# Patient Record
Sex: Male | Born: 1955 | Race: White | Hispanic: No | Smoking: Never smoker
Health system: Southern US, Community
[De-identification: ages and names within clinical notes are randomized; demographics above are authoritative.]

## PROBLEM LIST (undated history)

## (undated) DIAGNOSIS — N486 Induration penis plastica: Secondary | ICD-10-CM

## (undated) DIAGNOSIS — K649 Unspecified hemorrhoids: Secondary | ICD-10-CM

## (undated) DIAGNOSIS — K219 Gastro-esophageal reflux disease without esophagitis: Secondary | ICD-10-CM

## (undated) DIAGNOSIS — E039 Hypothyroidism, unspecified: Secondary | ICD-10-CM

## (undated) DIAGNOSIS — T7840XA Allergy, unspecified, initial encounter: Secondary | ICD-10-CM

## (undated) DIAGNOSIS — E785 Hyperlipidemia, unspecified: Secondary | ICD-10-CM

## (undated) HISTORY — PX: HERNIA REPAIR: SHX51

## (undated) HISTORY — PX: SPINE SURGERY: SHX786

## (undated) HISTORY — DX: Gastro-esophageal reflux disease without esophagitis: K21.9

## (undated) HISTORY — PX: OTHER SURGICAL HISTORY: SHX169

## (undated) HISTORY — PX: COLONOSCOPY: SHX174

## (undated) HISTORY — DX: Hyperlipidemia, unspecified: E78.5

## (undated) HISTORY — DX: Unspecified hemorrhoids: K64.9

## (undated) HISTORY — PX: TOENAIL EXCISION: SHX183

## (undated) HISTORY — DX: Induration penis plastica: N48.6

## (undated) HISTORY — DX: Allergy, unspecified, initial encounter: T78.40XA

## (undated) HISTORY — PX: BACK SURGERY: SHX140

## (undated) HISTORY — DX: Hypothyroidism, unspecified: E03.9

## (undated) HISTORY — PX: MOHS SURGERY: SUR867

---
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
## 2003-09-28 HISTORY — PX: INGUINAL HERNIA REPAIR: SHX194

## 2004-04-03 ENCOUNTER — Ambulatory Visit: Payer: Self-pay | Admitting: Internal Medicine

## 2004-08-26 ENCOUNTER — Ambulatory Visit: Payer: Self-pay | Admitting: Internal Medicine

## 2004-08-29 ENCOUNTER — Encounter: Payer: Self-pay | Admitting: Internal Medicine

## 2004-09-05 ENCOUNTER — Ambulatory Visit: Payer: Self-pay | Admitting: Orthopedic Surgery

## 2005-05-13 ENCOUNTER — Ambulatory Visit: Payer: Self-pay | Admitting: Internal Medicine

## 2005-08-18 ENCOUNTER — Ambulatory Visit: Payer: Self-pay | Admitting: Family Medicine

## 2005-09-18 ENCOUNTER — Ambulatory Visit: Payer: Self-pay | Admitting: Internal Medicine

## 2005-11-27 ENCOUNTER — Ambulatory Visit: Payer: Self-pay | Admitting: Internal Medicine

## 2007-02-19 DIAGNOSIS — N486 Induration penis plastica: Secondary | ICD-10-CM | POA: Insufficient documentation

## 2007-02-19 DIAGNOSIS — A6000 Herpesviral infection of urogenital system, unspecified: Secondary | ICD-10-CM | POA: Insufficient documentation

## 2007-03-17 ENCOUNTER — Ambulatory Visit: Payer: Self-pay | Admitting: Internal Medicine

## 2007-03-17 DIAGNOSIS — J309 Allergic rhinitis, unspecified: Secondary | ICD-10-CM | POA: Insufficient documentation

## 2007-03-17 DIAGNOSIS — R079 Chest pain, unspecified: Secondary | ICD-10-CM | POA: Insufficient documentation

## 2007-04-28 ENCOUNTER — Ambulatory Visit: Payer: Self-pay | Admitting: Gastroenterology

## 2007-05-12 ENCOUNTER — Encounter: Payer: Self-pay | Admitting: Internal Medicine

## 2007-05-12 ENCOUNTER — Ambulatory Visit: Payer: Self-pay | Admitting: Gastroenterology

## 2007-05-12 LAB — HM COLONOSCOPY

## 2007-11-07 ENCOUNTER — Emergency Department: Payer: Self-pay | Admitting: Emergency Medicine

## 2008-05-26 ENCOUNTER — Ambulatory Visit: Payer: Self-pay | Admitting: Internal Medicine

## 2008-05-26 LAB — CONVERTED CEMR LAB
Cholesterol: 159 mg/dL (ref 0–200)
Glucose, Bld: 95 mg/dL (ref 70–99)
HDL: 37.9 mg/dL — ABNORMAL LOW (ref >39.00)
LDL Cholesterol: 108 mg/dL — ABNORMAL HIGH (ref 0–99)
PSA: 1.9 ng/mL (ref 0.10–4.00)
Total CHOL/HDL Ratio: 4
Triglycerides: 68 mg/dL (ref 0.0–149.0)
VLDL: 13.6 mg/dL (ref 0.0–40.0)

## 2008-09-10 ENCOUNTER — Emergency Department: Payer: Self-pay | Admitting: Emergency Medicine

## 2009-07-11 ENCOUNTER — Ambulatory Visit: Payer: Self-pay | Admitting: Internal Medicine

## 2009-07-11 DIAGNOSIS — D239 Other benign neoplasm of skin, unspecified: Secondary | ICD-10-CM | POA: Insufficient documentation

## 2009-07-12 LAB — CONVERTED CEMR LAB
Glucose, Bld: 95 mg/dL (ref 70–99)
PSA: 1.65 ng/mL (ref 0.10–4.00)

## 2010-02-24 LAB — CONVERTED CEMR LAB
ALT: 30 units/L (ref 0–53)
AST: 23 units/L (ref 0–37)
Albumin: 4.2 g/dL (ref 3.5–5.2)
Alkaline Phosphatase: 62 units/L (ref 39–117)
BUN: 15 mg/dL (ref 6–23)
Basophils Absolute: 0 10*3/uL (ref 0.0–0.1)
Basophils Relative: 0.1 % (ref 0.0–1.0)
Bilirubin Urine: NEGATIVE
Bilirubin, Direct: 0.1 mg/dL (ref 0.0–0.3)
Blood in Urine, dipstick: NEGATIVE
CO2: 31 meq/L (ref 19–32)
Calcium: 9.3 mg/dL (ref 8.4–10.5)
Chloride: 103 meq/L (ref 96–112)
Creatinine, Ser: 1.4 mg/dL (ref 0.4–1.5)
Eosinophils Absolute: 0.1 10*3/uL (ref 0.0–0.6)
Eosinophils Relative: 2 % (ref 0.0–5.0)
GFR calc Af Amer: 69 mL/min
GFR calc non Af Amer: 57 mL/min
Glucose, Bld: 90 mg/dL (ref 70–99)
Glucose, Urine, Semiquant: NEGATIVE
HCT: 48.4 % (ref 39.0–52.0)
Hemoglobin: 16.2 g/dL (ref 13.0–17.0)
Ketones, urine, test strip: NEGATIVE
Lymphocytes Relative: 25.7 % (ref 12.0–46.0)
MCHC: 33.4 g/dL (ref 30.0–36.0)
MCV: 91.1 fL (ref 78.0–100.0)
Monocytes Absolute: 0.7 10*3/uL (ref 0.2–0.7)
Monocytes Relative: 9.2 % (ref 3.0–11.0)
Neutro Abs: 4.5 10*3/uL (ref 1.4–7.7)
Neutrophils Relative %: 63 % (ref 43.0–77.0)
Nitrite: NEGATIVE
PSA: 1.59 ng/mL (ref 0.10–4.00)
Phosphorus: 3.6 mg/dL (ref 2.3–4.6)
Platelets: 290 10*3/uL (ref 150–400)
Potassium: 4.3 meq/L (ref 3.5–5.1)
Protein, U semiquant: NEGATIVE
RBC: 5.32 M/uL (ref 4.22–5.81)
RDW: 11.4 % — ABNORMAL LOW (ref 11.5–14.6)
Sodium: 140 meq/L (ref 135–145)
Specific Gravity, Urine: <1.005
TSH: 1.29 microintl units/mL (ref 0.35–5.50)
Total Bilirubin: 1.1 mg/dL (ref 0.3–1.2)
Total Protein: 6.8 g/dL (ref 6.0–8.3)
Urobilinogen, UA: 0.2
WBC Urine, dipstick: NEGATIVE
WBC: 7.2 10*3/uL (ref 4.5–10.5)
pH: 7

## 2010-02-26 NOTE — Assessment & Plan Note (Signed)
Summary: CPX/RBH   Vital Signs:  Patient profile:   55 year old male Height:      67 inches Weight:      176 pounds BMI:     27.67 Temp:     98.4 degrees F oral Pulse rate:   60 / minute Pulse rhythm:   regular BP sitting:   120 / 66  (left arm) Cuff size:   regular  Vitals Entered By: Mervin Hack CMA (May 26, 2008 8:09 AM)  History of Present Illness: Chief Complaint: adult physical  Doing fairly well  Has noticed some pounding in left ear at times--faster than his heart No change in mild hearing loss No tinnitus or vertigo No headaches  No other concerns  Allergies: 1)  ! * Propulsid  Past History:  Past medical, surgical, family and social histories (including risk factors) reviewed for relevance to current acute and chronic problems.  Past Medical History:    Reviewed history from 03/17/2007 and no changes required:    Peyronnie's    Allergic rhinitis  Past Surgical History:    Reviewed history from 02/19/2007 and no changes required:    Inguinal herniorrhaphy (09/2003)    Tonsillectomy (1972)    Partial toenail removal (1995)  Family History:    Reviewed history from 03/17/2007 and no changes required:       Dad with MI       Mom with arthritis       Pat aunt--jaw  cancer       Both GF died of CAD       Pat GM died of suicide. Had breast cancer       Mat GM died of CVA       No DM, HTN       Sister has Hodgkin's lymphoma       Pat uncle with celiac disease       Other sister had precancerous polyps       Pat uncle--mesothelioma       Other breast cancer also  Social History:    Reviewed history from 03/17/2007 and no changes required:       Marital Status: Married       Children: 2       Occupation: Print production planner at [employer]       Never Smoked       Alcohol use-no  Review of Systems General:  generally doesn't sleep that well--wife snores bad and has sleep eval Exercises regularly over the past couple of months weight  down slightly in past year Wears seat belt. Eyes:  Denies double vision and vision loss-1 eye. ENT:  Denies decreased hearing, earache, and ringing in ears; teeth okay--sees dentist. CV:  Denies chest pain or discomfort, difficulty breathing at night, difficulty breathing while lying down, fainting, lightheadness, and shortness of breath with exertion. Resp:  Complains of cough; denies shortness of breath; has globus sensation--work up in past Rx with sinus stuff and prilosec seen at [facility]. GI:  Complains of indigestion; denies abdominal pain, bloody stools, change in bowel habits, dark tarry stools, nausea, and vomiting; only occ heartburn--no regular meds. GU:  Complains of decreased libido, nocturia, and urinary hesitancy; denies erectile dysfunction and urinary frequency; occ right inguinal pain--better since starting exercise program Only intermittent nocturia slight dribbling. MS:  Complains of joint pain; denies joint swelling; occ mild scattered joint pain--rare ibuprofen. Derm:  Complains of lesion(s); denies rash; still with red round lesion on upper left arm.  Neuro:  Denies headaches, numbness, tingling, and weakness. Psych:  Denies anxiety and depression. Endo:  Denies cold intolerance and heat intolerance. Heme:  Denies abnormal bruising and enlarge lymph nodes. Allergy:  Denies seasonal allergies and sneezing; runny nose occ when mowing.  Physical Exam  General:  alert and normal appearance.   Eyes:  pupils equal, pupils round, pupils reactive to light, and no optic disk abnormalities.   Ears:  R ear normal and L ear normal.   Mouth:  no erythema and no lesions.   Neck:  supple, no masses, no thyromegaly, no carotid bruits, and no cervical lymphadenopathy.   Lungs:  normal respiratory effort and normal breath sounds.   Heart:  normal rate, regular rhythm, no murmur, and no gallop.   Abdomen:  soft, non-tender, and no masses.   Rectal:  no hemorrhoids and no masses.     Prostate:  no gland enlargement and no nodules.   Msk:  no joint tenderness and no joint swelling.   Pulses:  normal in feet Extremities:  no edema Neurologic:  alert & oriented X3 and strength normal in all extremities.   Skin:  8mm slightly raised irregular bordered lesion on upper left arm no other lesions Axillary Nodes:  No palpable lymphadenopathy Psych:  normally interactive, good eye contact, not anxious appearing, and not depressed appearing.     Impression & Recommendations:  Problem # 1:  PREVENTIVE HEALTH CARE (ICD-V70.0) Assessment Comment Only  healthy will check PSA, lipid, glucose will get tetanus at work  Orders: TLB-Lipid Panel (80061-LIPID) TLB-Glucose, QUANT (82947-GLU)  Problem # 2:  DERMATOFIBROMA (ICD-216.9) Assessment: New  liquid nitrogen 40 seconds x 2  Orders: Cryotherapy/Destruction benign or premalignant lesion (1st lesion)  (17000)  Complete Medication List: 1)  Triamcinolone Acetonide 0.1 % Crea (Triamcinolone acetonide) 2)  Proctosol Hc 2.5 % Crea (Hydrocortisone)  Other Orders: TLB-PSA (Prostate Specific Antigen) (84153-PSA)  Patient Instructions: 1)  Please schedule a follow-up appointment in 1 year.     Current Allergies (reviewed today): ! * PROPULSID

## 2010-02-26 NOTE — Procedures (Signed)
Summary: Colonoscopy by Dr.Jacobs  Colonoscopy by Dr.Jacobs   Imported By: Beau Fanny 05/20/2007 14:14:23  _____________________________________________________________________  External Attachment:    Type:   Image     Comment:   External Document  Appended Document: Colonoscopy by Dr.Jacobs    Clinical Lists Changes  Observations: Added new observation of COLONNXTDUE: 04/2017 (05/21/2007 17:55) Added new observation of COLONOSCOPY: diverticulosis (05/12/2007 17:56)        Colonoscopy  Procedure date:  05/12/2007  Findings:      diverticulosis  Procedures Next Due Date:    Colonoscopy: 04/2017

## 2010-02-26 NOTE — Consult Note (Signed)
Summary: Willie Griffin   Imported By: Carin Primrose 03/18/2007 09:22:21  _____________________________________________________________________  External Attachment:    Type:   Image     Comment:   External Document

## 2010-02-26 NOTE — Assessment & Plan Note (Signed)
Summary: CPX/HEA   Vital Signs:  Patient Profile:   55 Years Old Male Weight:      179.38 pounds Temp:     97.9 degrees F oral Pulse rate:   68 / minute BP sitting:   122 / 76  (left arm)  Vitals Entered By: Wandra Mannan (March 17, 2007 9:23 AM)              Vision Screening: Left eye w/o correction: 20 / 20 Right Eye w/o correction: 20 / 20 Both eyes w/o correction:  20/ 20  Color vision testing: normal      Vision Entered By: Wandra Mannan (March 17, 2007 9:24 AM) Audiometry Screening        25db HL: Yes 40db HL: Yes Left  500 hz: 25db 1000 hz: 25db 2000 hz: 25db 4000 hz: 25db Right  500 hz: 25db 1000 hz: 25db 2000 hz: 25db 4000 hz: 25db    Chief Complaint:  CPx fill out DOT form.  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 * PROPULSID  Past Medical History:    Peyronnie's    Allergic rhinitis  Past Surgical History:    Reviewed history from 02/19/2007 and no changes required:       Inguinal herniorrhaphy (09/2003)       Tonsillectomy (1972)       Partial toenail removal (1995)   Family History:    Dad with MI    Mom with arthritis    Pat aunt--jaw  cancer    Both GF died of CAD    Pat GM died of suicide. Had breast cancer    Mat GM died of CVA    No DM, HTN    Sister has Hodgkin's lymphoma    Pat uncle with celiac disease    Other sister had precancerous polyps    Pat uncle--mesothelioma  Other breast cancer also  Social History:    Marital Status: Married    Children: 2    Occupation: Print production planner at [employer]    Never Smoked    Alcohol use-no   Risk Factors:  Tobacco use:  never Alcohol use:  no   Review of Systems       The patient complains of chest pain.  The patient denies vision loss, decreased hearing, syncope, dyspnea on exhertion, peripheral edema, abdominal pain, melena, hematochezia, muscle weakness, suspicious skin lesions, depression, abnormal bleeding, and enlarged lymph nodes.         Sleeps fair--not as well as in past.  Only occ daytime somnolence wears seat belt teeth okay--regular with dentist Chest pain 2 weeks ago while digging a grave by himself---was up by left shoulder. Felt heart racing. No SOB Chronic cough--feeling in throat did have dry patch on left arm--better with cortisone cream Has foster kids--twins   Physical Exam  General:     alert and normal appearance.   Eyes:     pupils equal,  pupils round, pupils reactive to light, and no optic disk abnormalities.   Ears:     R ear normal and L ear normal.   Mouth:     no lesions.   Neck:     supple, no masses, no thyromegaly, no carotid bruits, and no cervical lymphadenopathy.   Lungs:     normal respiratory effort and normal breath sounds.   Heart:     normal rate, regular rhythm, no murmur, and no gallop.   Abdomen:     soft, non-tender, and no masses.   Right inguinal bulge--not tender Rectal:     no hemorrhoids and no masses.   Genitalia:     circumcised, no scrotal masses, and no testicular masses or atrophy.   Prostate:     no gland enlargement and no nodules.   Msk:     no joint tenderness and no joint swelling.   Pulses:     1+ in feet Extremities:     no edema Neurologic:     strength normal in all extremities.   Skin:     macular red lesion on left upper arm--not an actinic Axillary Nodes:     No palpable lymphadenopathy Psych:      normally interactive, good eye contact, not anxious appearing, and not depressed appearing.      Impression & Recommendations:  Problem # 1:  PREVENTIVE HEALTH CARE (ICD-V70.0) Assessment: Comment Only PSA Agrees to colon CDL done Orders: UA Dipstick W/ Micro (manual) (60454)   Problem # 2:  CHEST PAIN (ICD-786.50) Assessment: New sounds like he overdid it--may have even been from shoulder since he was digging He will call if any recurrence Orders: EKG w/ Interpretation (93000) Venipuncture (09811) TLB-Renal Function Panel (80069-RENAL) TLB-CBC Platelet - w/Differential (85025-CBCD) TLB-Hepatic/Liver Function Pnl (80076-HEPATIC) TLB-TSH (Thyroid Stimulating Hormone) (84443-TSH)   Complete Medication List: 1)  Triamcinolone Acetonide 0.1 % Crea (Triamcinolone acetonide) 2)  Proctosol Hc 2.5 % Crea (Hydrocortisone)  Other Orders: Gastroenterology Referral (GI) TLB-PSA (Prostate Specific Antigen) (84153-PSA)   Patient Instructions: 1)  Please schedule a follow-up appointment in 1 year.    ] Current Allergies (reviewed today): ! * PROPULSID Current Medications (including changes made in today's visit):  TRIAMCINOLONE ACETONIDE 0.1 % CREA (TRIAMCINOLONE ACETONIDE)  PROCTOSOL HC 2.5 % CREA (HYDROCORTISONE)    Laboratory Results   Urine Tests  Date/Time Recieved: 03/17/2007  Routine Urinalysis   Color: yellow Appearance: Clear Glucose: negative   (Normal Range: Negative) Bilirubin: negative   (Normal Range: Negative) Ketone: negative   (Normal Range: Negative) Spec. Gravity: <1.005   (Normal Range: 1.003-1.035) Blood: negative   (Normal Range: Negative) pH: 7.0   (Normal Range: 5.0-8.0) Protein: negative   (Normal Range: Negative) Urobilinogen: 0.2   (Normal Range: 0-1) Nitrite: negative   (Normal Range: Negative) Leukocyte Esterace: negative   (Normal Range: Negative)         EKG  Procedure date:  03/17/2007  Findings:      Sinus at 56 Normal  otherwise

## 2010-02-26 NOTE — Assessment & Plan Note (Signed)
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 * Propulsid  Review of Systems General:  weight fairly stable sleeps fair but rarely through the night--may be up for an hour wears seat belt. Eyes:  Denies double vision and vision loss-1 eye; some deterioration of vision. ENT:  Complains of decreased hearing and ringing in ears; rare tinnitus mild hearing loss teeth okay--regular with dentist. Recent extractions (2 failed root canals). CV:  Denies chest pain or discomfort, difficulty breathing at night, difficulty breathing while lying down, fainting, lightheadness, palpitations, and shortness of breath with exertion. Resp:  Complains of cough; denies shortness of breath; still with regular cough---?drainage, heartburn. GI:  Complains of indigestion; denies abdominal pain, bloody stools, change in bowel habits, dark tarry stools, nausea, and vomiting. GU:  Denies erectile dysfunction, urinary frequency, and urinary hesitancy; occ mild dribble. MS:  Complains of joint pain; denies joint swelling; finger pains occ---ibuprofen seems to help. Derm:  Complains of lesion(s); denies rash; red spot on right leg occ other small spots. Neuro:  Complains of numbness; denies headaches, tingling, and weakness; numbness on left side of lips intermittently (very brief) occ numbness in tips of fingers. Psych:  Denies anxiety and depression. Heme:  Denies abnormal bruising and  enlarge lymph nodes. Allergy:  Complains of seasonal allergies and sneezing; ??dust senstivity--no meds.  Physical Exam  General:  alert and normal appearance.   Eyes:  pupils equal, pupils round, pupils reactive to light, and no optic disk abnormalities.   Ears:  R ear normal and L ear normal.   Mouth:  no erythema, no exudates, and no lesions.   Neck:  supple, no masses, no thyromegaly, no carotid bruits, and no cervical lymphadenopathy.   Lungs:  normal respiratory effort and normal breath sounds.   Heart:  normal rate, regular rhythm, no murmur, and no gallop.   Abdomen:  soft and non-tender.   Rectal:  no hemorrhoids and no masses.   Prostate:  no gland enlargement and no nodules.   Msk:  no joint tenderness and no joint swelling.   Pulses:  normal in feet Extremities:  no edema superficial varicosities on left calf Neurologic:  alert & oriented X3, strength normal in all extremities, and gait normal.   Skin:  no rashes and no suspicious lesions.   ?small dermatofibroma on right thigh Axillary Nodes:  No palpable lymphadenopathy Psych:  normally interactive, good eye contact, not anxious appearing, and not depressed appearing.     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    Prior Medications: Current Allergies (reviewed today): ! * PROPULSID

## 2010-05-13 ENCOUNTER — Other Ambulatory Visit: Payer: Self-pay | Admitting: *Deleted

## 2010-05-13 MED ORDER — TRIAMCINOLONE ACETONIDE 0.1 % EX CREA: TOPICAL_CREAM | CUTANEOUS | Status: DC

## 2010-05-13 NOTE — Telephone Encounter (Signed)
Okay #30 gm x 2 Can be done electronically

## 2010-05-13 NOTE — Telephone Encounter (Signed)
rx sent to pharmacy

## 2010-08-08 ENCOUNTER — Encounter: Payer: Self-pay | Admitting: Internal Medicine

## 2010-08-09 ENCOUNTER — Encounter: Payer: Self-pay | Admitting: Internal Medicine

## 2010-08-09 ENCOUNTER — Ambulatory Visit (INDEPENDENT_AMBULATORY_CARE_PROVIDER_SITE_OTHER): Payer: Commercial Indemnity | Admitting: Internal Medicine

## 2010-08-09 VITALS — BP 122/68 | HR 62 | Temp 98.4°F | Ht 68.5 in | Wt 179.0 lb

## 2010-08-09 DIAGNOSIS — Z0000 Encounter for general adult medical examination without abnormal findings: Secondary | ICD-10-CM | POA: Insufficient documentation

## 2010-08-09 LAB — GLUCOSE, RANDOM: Glucose, Bld: 102 mg/dL — ABNORMAL HIGH (ref 70–99)

## 2010-08-09 MED ORDER — HYDROCORTISONE 2.5 % RE CREA: TOPICAL_CREAM | RECTAL | Status: AC

## 2010-08-09 NOTE — Assessment & Plan Note (Signed)
Doing well Reasonable shape--weight stable Discussed PSA--will defer and consider again next year Will check glucose

## 2010-08-09 NOTE — Progress Notes (Signed)
Subjective:    Patient ID: Willie Griffin, male    DOB: [birth year], 55 y.o.   MRN: [id number]  HPI Doing pretty well Does do AM calisthenics, plays softball Some activity at work---mostly on lawnmower, etc though  Does note some aches and pains  Current Outpatient Prescriptions on File Prior to Visit  Medication Sig Dispense Refill  . triamcinolone (KENALOG) 0.1 % cream Uses as directed  30 g  2    No Known Allergies  Past Medical History  Diagnosis Date  . Peyronie disease   . Allergy     allergic rhinitis    Past Surgical History  Procedure Date  . Inguinal hernia repair 9/05  . Tonsillectomy 1972  . Toenail excision     partial    Family History  Problem Relation Age of Onset  . Heart attack Father   . Arthritis Mother   . Cancer Paternal Aunt     jaw ca  . Coronary artery disease Paternal Grandfather   . Coronary artery disease Maternal Grandfather   . Suicidality Paternal Grandmother   . Breast cancer Paternal Grandmother   . Stroke Maternal Grandmother   . Diabetes Neg Hx   . Hypertension Neg Hx   . Hodgkin's lymphoma Sister   . Celiac disease Paternal Uncle   . Other Sister     precancerous polyps  . Other Paternal Uncle     mesothelioma  . Breast cancer Other     History   Social History  . Marital Status: Married    Spouse Name: N/A    Number of Children: 2  . Years of Education: N/A   Occupational History  . 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 Smoking status: Never Smoker   . Smokeless tobacco: Not on file  . Alcohol Use: No  . Drug Use: Not on file  . Sexually Active: Not on file   Other Topics Concern  . Not on file   Social History Narrative  . No narrative on file   Review of Systems  Constitutional: Negative for fatigue and unexpected weight change.       Wears seat belt  HENT: Positive for congestion and rhinorrhea. Negative for hearing loss, dental problem and tinnitus.          Keeps up with dentist--due to fix broken tooth Allergies picking up---uses OTC meds prn, mostly in spring  Eyes: Negative for visual disturbance.       Notices some decline in vision--reading glasses help No diplopia or focal vision loss  Respiratory: Positive for cough. Negative for chest tightness and shortness of breath.        Chronic irritating cough  Cardiovascular: Positive for leg swelling. Negative for chest pain and palpitations.       Occ slight sock impression in ankles by the end of the day  Gastrointestinal: Positive for anal bleeding. Negative for nausea, vomiting, abdominal pain, constipation and blood in stool.       Occ heartburn--not regular occ problems with bowels if meals are irregular (but usually goes after each meal) Occ irritated rectum---occ blood which the cream helps  Genitourinary: Positive for difficulty urinating. Negative for dysuria and frequency.       Occ dribbling No sexual problems--no problems with penis except rarely with very hard erection  Musculoskeletal: Positive for back pain and arthralgias. Negative for joint swelling.       Occ  aches and stiffening in back---doesn't need meds  Skin: Positive for rash.       occ heat rash No suspicious lesions  Neurological: Positive for numbness (occ brief numbness in fingers). Negative for dizziness, syncope, weakness, light-headedness and headaches.  Hematological: Negative for adenopathy. Does not bruise/bleed easily.  Psychiatric/Behavioral: Positive for sleep disturbance. Negative for dysphoric mood. The patient is not nervous/anxious.        Having some trouble with sleep---new mattress and wife snores. Needs to reevaluate pillows. Discussed sleep hygiene (often falls asleep in chair)       Objective:   Physical Exam  Constitutional: He is oriented to person, place, and time. He appears well-developed and well-nourished. No distress.  HENT:  Head: Normocephalic and atraumatic.  Right Ear:  External ear normal.  Left Ear: External ear normal.  Mouth/Throat: Oropharynx is clear and moist. No oropharyngeal exudate.       TMs normal  Eyes: Conjunctivae and EOM are normal. Pupils are equal, round, and reactive to light.       Fundi normal  Neck: Normal range of motion. Neck supple. No thyromegaly present.  Cardiovascular: Normal rate, regular rhythm, normal heart sounds and intact distal pulses.  Exam reveals no gallop.   No murmur heard. Pulmonary/Chest: Effort normal and breath sounds normal. No respiratory distress. He has no wheezes. He has no rales.  Abdominal: Soft. He exhibits no mass. There is no tenderness.  Musculoskeletal: Normal range of motion. He exhibits no edema and no tenderness.       No active synovitis  Lymphadenopathy:    He has no cervical adenopathy.  Neurological: He is alert and oriented to person, place, and time. He exhibits normal muscle tone.       Normal strength and gait  Skin: Skin is warm. No rash noted.  Psychiatric: He has a normal mood and affect. His behavior is normal. Judgment and thought content normal.          Assessment & Plan:

## 2010-09-04 ENCOUNTER — Telehealth: Payer: Self-pay | Admitting: *Deleted

## 2010-09-04 NOTE — Telephone Encounter (Signed)
Spoke with wife and per pt's med list we sent in CREAM not suppositories, I advised wife she needed to contact the pharmacy and maybe they will give her the correct rx?  Spoke with pharmacy and they did give the pt the wrong rx, per pharmacy they don't have the 2.5% rectal cream, they have just 2.5% cream is that ok?

## 2010-09-04 NOTE — Telephone Encounter (Signed)
I think these are essentially the same but okay to change to proctosol

## 2010-09-04 NOTE — Telephone Encounter (Signed)
Please get this corrected Send in Rx for cream or suppositories as they prefer

## 2010-09-04 NOTE — Telephone Encounter (Signed)
Wife says the patient received the incorrect prescription.  He was prescribed the Hydrocortisone AC Suppositories and it should have been the Proctosol.  Uses [facility], [place].

## 2010-09-05 ENCOUNTER — Telehealth: Payer: Self-pay | Admitting: *Deleted

## 2010-09-05 NOTE — Telephone Encounter (Signed)
Okay Glad we got this straightened out

## 2010-09-05 NOTE — Telephone Encounter (Signed)
Pharmacist called to report that she had filled pt's hydrocortisone rectal cream with suppositories by mistake.  Pt brought the suppositories back and was given the script for the cream free of charge.  Pharmacist says she is required to let you know this mistake had happened.

## 2010-09-05 NOTE — Telephone Encounter (Signed)
It was corrected by the pharmacy, pt will get the 2.5% cream.

## 2011-10-15 ENCOUNTER — Encounter: Payer: Self-pay | Admitting: Internal Medicine

## 2011-10-15 ENCOUNTER — Telehealth: Payer: Self-pay | Admitting: Internal Medicine

## 2011-10-15 ENCOUNTER — Ambulatory Visit (INDEPENDENT_AMBULATORY_CARE_PROVIDER_SITE_OTHER): Payer: BC Managed Care – PPO | Admitting: Internal Medicine

## 2011-10-15 VITALS — BP 128/80 | HR 61 | Temp 98.2°F | Ht 68.0 in | Wt 180.0 lb

## 2011-10-15 DIAGNOSIS — R209 Unspecified disturbances of skin sensation: Secondary | ICD-10-CM

## 2011-10-15 DIAGNOSIS — Z0289 Encounter for other administrative examinations: Secondary | ICD-10-CM

## 2011-10-15 DIAGNOSIS — Z021 Encounter for pre-employment examination: Secondary | ICD-10-CM

## 2011-10-15 DIAGNOSIS — R200 Anesthesia of skin: Secondary | ICD-10-CM | POA: Insufficient documentation

## 2011-10-15 DIAGNOSIS — Z0000 Encounter for general adult medical examination without abnormal findings: Secondary | ICD-10-CM

## 2011-10-15 LAB — BASIC METABOLIC PANEL
BUN: 21 mg/dL (ref 6–23)
CO2: 29 mEq/L (ref 19–32)
Calcium: 9 mg/dL (ref 8.4–10.5)
Chloride: 103 mEq/L (ref 96–112)
Creatinine, Ser: 1 mg/dL (ref 0.4–1.5)
GFR: 78.91 mL/min (ref >60.00)
Glucose, Bld: 99 mg/dL (ref 70–99)
Potassium: 4.7 mEq/L (ref 3.5–5.1)
Sodium: 138 mEq/L (ref 135–145)

## 2011-10-15 LAB — CBC WITH DIFFERENTIAL/PLATELET
Basophils Absolute: 0 10*3/uL (ref 0.0–0.1)
Basophils Relative: 0.4 % (ref 0.0–3.0)
Eosinophils Absolute: 0.2 10*3/uL (ref 0.0–0.7)
Eosinophils Relative: 3 % (ref 0.0–5.0)
HCT: 48.5 % (ref 39.0–52.0)
Hemoglobin: 16.4 g/dL (ref 13.0–17.0)
Lymphocytes Relative: 29.1 % (ref 12.0–46.0)
Lymphs Abs: 1.6 10*3/uL (ref 0.7–4.0)
MCHC: 33.7 g/dL (ref 30.0–36.0)
MCV: 90.9 fl (ref 78.0–100.0)
Monocytes Absolute: 0.7 10*3/uL (ref 0.1–1.0)
Monocytes Relative: 12.8 % — ABNORMAL HIGH (ref 3.0–12.0)
Neutro Abs: 3 10*3/uL (ref 1.4–7.7)
Neutrophils Relative %: 54.7 % (ref 43.0–77.0)
Platelets: 247 10*3/uL (ref 150.0–400.0)
RBC: 5.34 Mil/uL (ref 4.22–5.81)
RDW: 12.1 % (ref 11.5–14.6)
WBC: 5.5 10*3/uL (ref 4.5–10.5)

## 2011-10-15 LAB — POCT URINALYSIS DIPSTICK
Bilirubin, UA: NEGATIVE
Blood, UA: NEGATIVE
Glucose, UA: NEGATIVE
Ketones, UA: NEGATIVE
Leukocytes, UA: NEGATIVE
Nitrite, UA: NEGATIVE
Protein, UA: NEGATIVE
Spec Grav, UA: 1.025
Urobilinogen, UA: NEGATIVE
pH, UA: 6.5

## 2011-10-15 LAB — HEPATIC FUNCTION PANEL
ALT: 28 U/L (ref 0–53)
AST: 24 U/L (ref 0–37)
Albumin: 4.2 g/dL (ref 3.5–5.2)
Alkaline Phosphatase: 60 U/L (ref 39–117)
Bilirubin, Direct: 0.2 mg/dL (ref 0.0–0.3)
Total Bilirubin: 0.9 mg/dL (ref 0.3–1.2)
Total Protein: 6.7 g/dL (ref 6.0–8.3)

## 2011-10-15 LAB — VITAMIN B12: Vitamin B-12: 373 pg/mL (ref 211–911)

## 2011-10-15 LAB — PSA: PSA: 2.19 ng/mL (ref 0.10–4.00)

## 2011-10-15 LAB — TSH: TSH: 1.15 u[IU]/mL (ref 0.35–5.50)

## 2011-10-15 NOTE — Progress Notes (Signed)
Subjective:    Patient ID: Willie Griffin, male    DOB: 18-Oct-1955, 56 y.o.   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 triamcinolone (KENALOG) 0.1 % cream Uses as directed  30 g  2    No Known Allergies  Past Medical History  Diagnosis Date  . Peyronie disease   . Allergy     allergic rhinitis    Past Surgical History  Procedure Date  . Inguinal hernia repair 9/05  . Tonsillectomy 1972  . Toenail excision     partial    Family History  Problem Relation Age of Onset  . Heart attack Father   . Arthritis Mother   . Cancer Paternal Aunt     jaw ca  . Coronary artery disease Paternal Grandfather   . Coronary artery disease Maternal Grandfather   . Suicidality Paternal Grandmother   . Breast cancer Paternal Grandmother   . Stroke Maternal Grandmother   . Diabetes Neg Hx   . Hypertension Neg Hx   . Hodgkin's lymphoma Sister   . Celiac disease Paternal Uncle   . Other Sister     precancerous polyps  . Other Paternal Uncle     mesothelioma  . Breast cancer Other     History   Social History  . Marital Status: Married    Spouse Name: N/A    Number of Children: 2  . Years of Education: N/A   Occupational History  . 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 Smoking status: Never Smoker   . Smokeless tobacco: Never Used  . Alcohol Use: No  . Drug Use: Not on file  . Sexually Active: Not on file   Other Topics Concern  . Not on file   Social History Narrative  . No narrative on file   Review of Systems  Constitutional: Negative for fatigue and unexpected weight change.       Wears seat belt  HENT: Positive for  congestion and rhinorrhea. Negative for hearing loss, dental problem and tinnitus.        Mild sinus symptoms Keeps up with dentist  Eyes: Negative for visual disturbance.       No diplopia or unilateral vision change  Respiratory: Positive for cough. Negative for chest tightness and shortness of breath.        Chronic occ cough due to throat irritation  Cardiovascular: Positive for leg swelling. Negative for chest pain and palpitations.       Slight indentation from sock after up all day  Gastrointestinal: Negative for nausea, vomiting, abdominal pain, constipation and blood in stool.       Occ heartburn--no meds (or rare tums)   Genitourinary: Positive for difficulty urinating.       Slight dribbling--not an issue No sexual problems  Musculoskeletal: Positive for arthralgias. Negative for back pain and joint swelling.       Occ hand pain--ibuprofen helps some  Skin: Positive for rash.       Several scaly areas on arms---nothing worrisome Tends to break out in rash some  Neurological: Positive for  numbness. Negative for dizziness, syncope, weakness, light-headedness and headaches.       Notes numbness in fingers at times---[place], in [place] upon awakening  Hematological: Negative for adenopathy. Does not bruise/bleed easily.  Psychiatric/Behavioral: Negative for disturbed wake/sleep cycle and dysphoric mood. The patient is not nervous/anxious.        Some word finding problems No apraxia       Objective:   Physical Exam  Constitutional: He is oriented to person, place, and time. He appears well-developed and well-nourished. No distress.  HENT:  Head: Normocephalic and atraumatic.  Right Ear: External ear normal.  Left Ear: External ear normal.  Mouth/Throat: Oropharynx is clear and moist. No oropharyngeal exudate.  Eyes: Conjunctivae normal and EOM are normal. Pupils are equal, round, and reactive to light.  Neck: Normal range of motion. Neck supple. No thyromegaly present.    Cardiovascular: Normal rate, regular rhythm, normal heart sounds and intact distal pulses.  Exam reveals no gallop.   No murmur heard. Pulmonary/Chest: Effort normal and breath sounds normal. No respiratory distress. He has no wheezes. He has no rales.  Abdominal: Soft. There is no tenderness.  Musculoskeletal: Normal range of motion. He exhibits no edema and no tenderness.       Slight knot along flexor tendon right 4th finger at MCP Calf varicosities on left (discussed support socks)  Lymphadenopathy:    He has no cervical adenopathy.  Neurological: He is alert and oriented to person, place, and time.  Skin: Skin is warm. No rash noted.  Psychiatric: He has a normal mood and affect. His behavior is normal.          Assessment & Plan:

## 2011-10-15 NOTE — Assessment & Plan Note (Signed)
Likely nerve compression related Will just check labs

## 2011-10-15 NOTE — Telephone Encounter (Signed)
Error

## 2011-10-15 NOTE — Assessment & Plan Note (Signed)
Healthy CDL form done for 2 years Will check PSA after discussion

## 2011-10-17 ENCOUNTER — Encounter: Payer: Self-pay | Admitting: *Deleted

## 2011-11-25 ENCOUNTER — Other Ambulatory Visit: Payer: Self-pay | Admitting: Internal Medicine

## 2011-11-25 NOTE — Telephone Encounter (Addendum)
Ok to fill? Last filled 09/04/2010

## 2011-11-26 NOTE — Telephone Encounter (Signed)
rx sent to pharmacy by e-script  

## 2011-11-26 NOTE — Telephone Encounter (Signed)
Okay to fill #1 tube with 1 refill

## 2012-06-30 ENCOUNTER — Encounter: Payer: Self-pay | Admitting: Internal Medicine

## 2012-06-30 ENCOUNTER — Ambulatory Visit (INDEPENDENT_AMBULATORY_CARE_PROVIDER_SITE_OTHER): Payer: BC Managed Care – PPO | Admitting: Internal Medicine

## 2012-06-30 VITALS — BP 140/80 | HR 62 | Temp 98.2°F | Wt 184.0 lb

## 2012-06-30 DIAGNOSIS — M25562 Pain in left knee: Secondary | ICD-10-CM

## 2012-06-30 DIAGNOSIS — M25569 Pain in unspecified knee: Secondary | ICD-10-CM

## 2012-06-30 NOTE — Progress Notes (Signed)
  Subjective:    Patient ID: Willie Griffin, male    DOB: [birth year], 57 y.o.   MRN: [id number]  HPI Having troubles with his knees The right knee has a numbness and burning sensation along the lateral surface Numbness all the time but burning just when kneeling on it Started about a month ago ---no known injury Limited since he can't kneel on that side  Left knee is swollen Constant pain and stiffness Feels like he has fluid on it Worse with sitting---like on lawn mower Hard getting up after sitting---doesn't really loosen up completely Still able to play softball--did improve as the game went on Goes back a couple of weeks  Tried cold packing--made left knee worse  Ibuprofen intermittently-- usually 400mg  (not clearly helpful)  Current Outpatient Prescriptions on File Prior to Visit  Medication Sig Dispense Refill  . PROCTOSOL HC 2.5 % rectal cream APPLY RECTALLY 2 TIMES A DAY  30 g  1  . triamcinolone (KENALOG) 0.1 % cream Uses as directed  30 g  2   No current facility-administered medications on file prior to visit.    No Known Allergies  Past Medical History  Diagnosis Date  . Peyronie disease   . Allergy     allergic rhinitis    Past Surgical History  Procedure Laterality Date  . Inguinal hernia repair  9/05  . Tonsillectomy  1972  . Toenail excision      partial    Family History  Problem Relation Age of Onset  . Heart attack Father   . Dementia Father   . Arthritis Mother   . Cancer Paternal Aunt     jaw ca  . Coronary artery disease Paternal Grandfather   . Coronary artery disease Maternal Grandfather   . Suicidality Paternal Grandmother   . Breast cancer Paternal Grandmother   . Stroke Maternal Grandmother   . Diabetes Neg Hx   . Hypertension Neg Hx   . Hodgkin's lymphoma Sister   . Celiac disease Paternal Uncle   . Other Sister     precancerous polyps  . Other Paternal Uncle     mesothelioma  . Breast cancer Other     History   Social  History  . Marital Status: Married    Spouse Name: N/A    Number of Children: 2  . Years of Education: N/A   Occupational History  . 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 Smoking status: Never Smoker   . Smokeless tobacco: Never Used  . Alcohol Use: No  . Drug Use: Not on file  . Sexually Active: Not on file   Other Topics Concern  . Not on file   Social History Narrative  . No narrative on file   Review of Systems No fever Hasn't felt sick     Objective:   Physical Exam  Constitutional: He appears well-developed and well-nourished. No distress.  Musculoskeletal:  Right knee without swelling Normal ROM No meniscus or ligament instability Slight crepitus  Left knee seems to have small effusion No ligament instability [place]rays positive for medial pain (also recreated with MCL testing)  Neurological:  Gait normal No weakness          Assessment & Plan:

## 2012-06-30 NOTE — Assessment & Plan Note (Signed)
Findings are concerning for meniscus tear Right knee pathology is unclear  Will set up with ortho for further eval

## 2012-06-30 NOTE — Patient Instructions (Signed)
Please increase the ibuprofen to 600-800mg  three times a day with meals.

## 2012-10-19 ENCOUNTER — Encounter: Payer: Self-pay | Admitting: Internal Medicine

## 2012-10-19 ENCOUNTER — Other Ambulatory Visit: Payer: Self-pay

## 2012-10-19 ENCOUNTER — Ambulatory Visit (INDEPENDENT_AMBULATORY_CARE_PROVIDER_SITE_OTHER): Payer: BC Managed Care – PPO | Admitting: Internal Medicine

## 2012-10-19 VITALS — BP 110/70 | HR 63 | Temp 97.8°F | Wt 181.0 lb

## 2012-10-19 DIAGNOSIS — Z0000 Encounter for general adult medical examination without abnormal findings: Secondary | ICD-10-CM

## 2012-10-19 DIAGNOSIS — R7989 Other specified abnormal findings of blood chemistry: Secondary | ICD-10-CM

## 2012-10-19 DIAGNOSIS — B079 Viral wart, unspecified: Secondary | ICD-10-CM | POA: Insufficient documentation

## 2012-10-19 LAB — LIPID PANEL
Cholesterol: 159 mg/dL (ref 0–200)
HDL: 41.6 mg/dL (ref >39.00)
LDL Cholesterol: 101 mg/dL — ABNORMAL HIGH (ref 0–99)
Total CHOL/HDL Ratio: 4
Triglycerides: 82 mg/dL (ref 0.0–149.0)
VLDL: 16.4 mg/dL (ref 0.0–40.0)

## 2012-10-19 LAB — GLUCOSE, RANDOM: Glucose, Bld: 99 mg/dL (ref 70–99)

## 2012-10-19 MED ORDER — HYDROCORTISONE 2.5 % RE CREA: TOPICAL_CREAM | RECTAL | Status: DC

## 2012-10-19 NOTE — Telephone Encounter (Signed)
Okay #30 gm x 2

## 2012-10-19 NOTE — Progress Notes (Signed)
Subjective:    Patient ID: Willie Griffin, male    DOB: 26-Aug-1955, 57 y.o.   MRN: [id number]  HPI Here for physical Knee is better---Dr Thurston Hole did cortisone shot and it resolved Still with mild numbness and tingling in hands----no worse. No weakness  Still annoying cough---like something in throat Intermittent ?allergy related No sig heartburn  Dad diagnosed with Lewy body dementia. At [facility] now Mom living with one of his sisters---will move into senior housing  Current Outpatient Prescriptions on File Prior to Visit  Medication Sig Dispense Refill  . PROCTOSOL HC 2.5 % rectal cream APPLY RECTALLY 2 TIMES A DAY  30 g  1  . triamcinolone (KENALOG) 0.1 % cream Uses as directed  30 g  2   No current facility-administered medications on file prior to visit.    No Known Allergies  Past Medical History  Diagnosis Date  . Peyronie disease   . Allergy     allergic rhinitis    Past Surgical History  Procedure Laterality Date  . Inguinal hernia repair  9/05  . Tonsillectomy  1972  . Toenail excision      partial    Family History  Problem Relation Age of Onset  . Heart attack Father   . Dementia Father     Lewy body dementia  . Arthritis Mother   . Cancer Paternal Aunt     jaw ca  . Coronary artery disease Paternal Grandfather   . Coronary artery disease Maternal Grandfather   . Suicidality Paternal Grandmother   . Breast cancer Paternal Grandmother   . Stroke Maternal Grandmother   . Diabetes Neg Hx   . Hypertension Neg Hx   . Hodgkin's lymphoma Sister   . Cancer Sister   . Celiac disease Paternal Uncle   . Other Sister     precancerous polyps  . Other Paternal Uncle     mesothelioma  . Breast cancer Other     History   Social History  . Marital Status: Married    Spouse Name: N/A    Number of Children: 2  . Years of Education: N/A   Occupational History  . 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 Smoking status: Never Smoker   . Smokeless tobacco: Never Used  . Alcohol Use: No  . Drug Use: Not on file  . Sexual Activity: Not on file   Other Topics Concern  . Not on file   Social History Narrative  . No narrative on file   Review of Systems  Constitutional: Negative for fatigue and unexpected weight change.       Weight down a few pounds Wears seat belt  HENT: Positive for dental problem and tinnitus. Negative for hearing loss, congestion and rhinorrhea.        Rare ringing Has chipped tooth to be repaired  Eyes: Negative for visual disturbance.       No unilateral vision loss Has astigmatism  Respiratory: Positive for cough. Negative for chest tightness and shortness of breath.        Aggravating cough  Cardiovascular: Positive for leg swelling. Negative for chest pain and palpitations.       Slight swelling in calves---imprint from socks by the end of the day  Gastrointestinal: Negative for nausea, vomiting, abdominal pain, constipation and blood in stool.       Rare heartburn Bleeding from hemorrhoids at times--cream helps  Endocrine: Negative for cold intolerance and heat intolerance.  Genitourinary: Negative for urgency and frequency.       Nocturia x 1 generally No sig ED---feels the Peyronie's is better Less libido  Musculoskeletal: Positive for back pain and arthralgias. Negative for joint swelling.       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       Has spot on left arm he wants checked Warty growth on arms and legs occ  Allergic/Immunologic: Positive for environmental allergies. Negative for immunocompromised state.       Uses cetirizine occasionally  Neurological: Positive for numbness. Negative for dizziness, syncope, weakness, light-headedness and headaches.  Hematological: Negative for adenopathy. Does  not bruise/bleed easily.  Psychiatric/Behavioral: Positive for sleep disturbance. Negative for dysphoric mood. The patient is not nervous/anxious.        Sleep disturbed by nocturia---usually can get back to sleep No significant daytime somnolence       Objective:   Physical Exam  Constitutional: He is oriented to person, place, and time. He appears well-developed and well-nourished. No distress.  HENT:  Head: Normocephalic and atraumatic.  Right Ear: External ear normal.  Left Ear: External ear normal.  Mouth/Throat: Oropharynx is clear and moist. No oropharyngeal exudate.  Eyes: Conjunctivae and EOM are normal. Pupils are equal, round, and reactive to light.  Neck: Normal range of motion. Neck supple. No thyromegaly present.  Cardiovascular: Normal rate, regular rhythm, normal heart sounds and intact distal pulses.  Exam reveals no gallop.   No murmur heard. Pulmonary/Chest: Effort normal and breath sounds normal. No respiratory distress. He has no wheezes. He has no rales.  Abdominal: Soft. There is no tenderness.  Musculoskeletal: He exhibits no edema and no tenderness.  Non tender calf varicosities  Lymphadenopathy:    He has no cervical adenopathy.  Neurological: He is alert and oriented to person, place, and time.  Skin: No rash noted.  6mm wart on left calf  Psychiatric: He has a normal mood and affect. His behavior is normal.          Assessment & Plan:

## 2012-10-19 NOTE — Addendum Note (Signed)
Addended by: Baldomero Lamy on: 10/19/2012 10:33 AM   Modules accepted: Orders

## 2012-10-19 NOTE — Telephone Encounter (Signed)
Pt left note requesting refill proctosol HC 2.5 % rectal cream to [facility].Please advise.

## 2012-10-19 NOTE — Assessment & Plan Note (Signed)
Liquid nitrogen 45 seconds x 2 Discussed home care 

## 2012-10-19 NOTE — Telephone Encounter (Signed)
rx sent to pharmacy by e-script  

## 2012-10-19 NOTE — Assessment & Plan Note (Signed)
Healthy Counseling done Will defer PSA till next year 

## 2012-10-20 ENCOUNTER — Encounter: Payer: Self-pay | Admitting: *Deleted

## 2013-10-21 ENCOUNTER — Encounter: Payer: Self-pay | Admitting: Internal Medicine

## 2013-10-21 ENCOUNTER — Ambulatory Visit (INDEPENDENT_AMBULATORY_CARE_PROVIDER_SITE_OTHER): Payer: BC Managed Care – PPO | Admitting: Internal Medicine

## 2013-10-21 VITALS — BP 112/70 | HR 62 | Temp 97.8°F | Ht 68.0 in | Wt 182.0 lb

## 2013-10-21 DIAGNOSIS — J309 Allergic rhinitis, unspecified: Secondary | ICD-10-CM

## 2013-10-21 DIAGNOSIS — R209 Unspecified disturbances of skin sensation: Secondary | ICD-10-CM

## 2013-10-21 DIAGNOSIS — Z0000 Encounter for general adult medical examination without abnormal findings: Secondary | ICD-10-CM

## 2013-10-21 DIAGNOSIS — R202 Paresthesia of skin: Secondary | ICD-10-CM

## 2013-10-21 DIAGNOSIS — L719 Rosacea, unspecified: Secondary | ICD-10-CM | POA: Insufficient documentation

## 2013-10-21 LAB — COMPREHENSIVE METABOLIC PANEL
ALT: 26 U/L (ref 0–53)
AST: 23 U/L (ref 0–37)
Albumin: 4.2 g/dL (ref 3.5–5.2)
Alkaline Phosphatase: 64 U/L (ref 39–117)
BUN: 18 mg/dL (ref 6–23)
CO2: 26 mEq/L (ref 19–32)
Calcium: 9.3 mg/dL (ref 8.4–10.5)
Chloride: 103 mEq/L (ref 96–112)
Creatinine, Ser: 1.3 mg/dL (ref 0.4–1.5)
GFR: 62.77 mL/min (ref >60.00)
Glucose, Bld: 98 mg/dL (ref 70–99)
Potassium: 4.4 mEq/L (ref 3.5–5.1)
Sodium: 136 mEq/L (ref 135–145)
Total Bilirubin: 1 mg/dL (ref 0.2–1.2)
Total Protein: 6.8 g/dL (ref 6.0–8.3)

## 2013-10-21 LAB — CBC WITH DIFFERENTIAL/PLATELET
Basophils Absolute: 0 10*3/uL (ref 0.0–0.1)
Basophils Relative: 0.3 % (ref 0.0–3.0)
Eosinophils Absolute: 0.2 10*3/uL (ref 0.0–0.7)
Eosinophils Relative: 2.1 % (ref 0.0–5.0)
HCT: 47.6 % (ref 39.0–52.0)
Hemoglobin: 16 g/dL (ref 13.0–17.0)
Lymphocytes Relative: 25.4 % (ref 12.0–46.0)
Lymphs Abs: 1.9 10*3/uL (ref 0.7–4.0)
MCHC: 33.7 g/dL (ref 30.0–36.0)
MCV: 91.5 fl (ref 78.0–100.0)
Monocytes Absolute: 0.6 10*3/uL (ref 0.1–1.0)
Monocytes Relative: 8.6 % (ref 3.0–12.0)
Neutro Abs: 4.7 10*3/uL (ref 1.4–7.7)
Neutrophils Relative %: 63.6 % (ref 43.0–77.0)
Platelets: 297 10*3/uL (ref 150.0–400.0)
RBC: 5.2 Mil/uL (ref 4.22–5.81)
RDW: 12.7 % (ref 11.5–15.5)
WBC: 7.4 10*3/uL (ref 4.0–10.5)

## 2013-10-21 LAB — T4, FREE: Free T4: 0.75 ng/dL (ref 0.60–1.60)

## 2013-10-21 LAB — VITAMIN B12: Vitamin B-12: 403 pg/mL (ref 211–911)

## 2013-10-21 LAB — PSA: PSA: 1.76 ng/mL (ref 0.10–4.00)

## 2013-10-21 MED ORDER — METRONIDAZOLE 0.75 % EX GEL: 1.0000 "application " | Freq: Two times a day (BID) | CUTANEOUS | Status: DC

## 2013-10-21 NOTE — Assessment & Plan Note (Signed)
Persistent without functional problems ?early CTS Will just check labs

## 2013-10-21 NOTE — Assessment & Plan Note (Signed)
Classic mild vessel dilatation on cheeks Will try metronidazole

## 2013-10-21 NOTE — Assessment & Plan Note (Signed)
Healthy Will check PSA this year UTD otherwise

## 2013-10-21 NOTE — Progress Notes (Signed)
Pre visit review using our clinic review tool, if applicable. No additional management support is needed unless otherwise documented below in the visit note. 

## 2013-10-21 NOTE — Assessment & Plan Note (Signed)
Likely causing the ear symptoms Recommended using the cetirizine

## 2013-10-21 NOTE — Progress Notes (Signed)
Subjective:    Patient ID: Willie Griffin, male    DOB: 1955-03-07, 58 y.o.   MRN: [id number]  HPI Here for physical  Some trouble with left ear---sensitive inside and some drainage No Rx  Dad still at [facility] with Lewy body dementia Mom in senior apt at [employer] (church sponsored)  No other concerns  Current Outpatient Prescriptions on File Prior to Visit  Medication Sig Dispense Refill  . hydrocortisone (PROCTOSOL HC) 2.5 % rectal cream APPLY RECTALLY 2 TIMES A DAY  30 g  2  . triamcinolone (KENALOG) 0.1 % cream Uses as directed  30 g  2   No current facility-administered medications on file prior to visit.    No Known Allergies  Past Medical History  Diagnosis Date  . Peyronie disease   . Allergy     allergic rhinitis    Past Surgical History  Procedure Laterality Date  . Inguinal hernia repair  9/05  . Tonsillectomy  1972  . Toenail excision      partial    Family History  Problem Relation Age of Onset  . Heart attack Father   . Dementia Father     Lewy body dementia  . Arthritis Mother   . Cancer Paternal Aunt     jaw ca  . Coronary artery disease Paternal Grandfather   . Coronary artery disease Maternal Grandfather   . Suicidality Paternal Grandmother   . Breast cancer Paternal Grandmother   . Stroke Maternal Grandmother   . Diabetes Neg Hx   . Hypertension Neg Hx   . Hodgkin's lymphoma Sister   . Cancer Sister   . Celiac disease Paternal Uncle   . Other Sister     precancerous polyps  . Other Paternal Uncle     mesothelioma  . Breast cancer Other     History   Social History  . Marital Status: Married    Spouse Name: N/A    Number of Children: 2  . Years of Education: N/A   Occupational History  . 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 Smokeless tobacco: Never Used  . Alcohol Use: No  . Drug Use: Not on file  . Sexual Activity: Not on file    Other Topics Concern  . Not on file   Social History Narrative  . No narrative on file   Review of Systems  Constitutional: Negative for fatigue and unexpected weight change.       Exercises regularly Wears seat belt  HENT: Negative for dental problem, hearing loss and tinnitus.        Regular with dentist   Eyes: Negative for visual disturbance.       No diplopia or unilateral vision loss  Respiratory: Positive for cough. Negative for chest tightness and shortness of breath.        Some sense of something in his throat  Cardiovascular: Positive for leg swelling.       Calf veins on left---occasional pain or itching Discussed support socks  Gastrointestinal: Negative for nausea, vomiting, abdominal pain, constipation and blood in stool.       No heartburn  Endocrine: Negative for polydipsia and polyuria.  Genitourinary: Positive for frequency. Negative for urgency and difficulty urinating.       Increased nocturia--not a problem No sexual problems  Musculoskeletal: Positive for back pain. Negative for arthralgias  and joint swelling.       No recent joint pain---intermittent back pain (from riding mower?) Sees Dr Joyce Copa with relief  Skin: Positive for rash.       Gets some rash on cheeks--?rosacea (worse in summer) Uses sun protection  Allergic/Immunologic: Positive for environmental allergies. Negative for immunocompromised state.       Uses zyrtec in spring  Neurological: Positive for numbness. Negative for dizziness, syncope, weakness, light-headedness and headaches.       Same numbness in hands--mostly left 3rd and 4th  Hematological: Negative for adenopathy. Does not bruise/bleed easily.  Psychiatric/Behavioral: Negative for sleep disturbance and dysphoric mood. The patient is not nervous/anxious.        Objective:   Physical Exam  Constitutional: He is oriented to person, place, and time. He appears well-developed and well-nourished. No distress.  HENT:  Head:  Normocephalic and atraumatic.  Right Ear: External ear normal.  Left Ear: External ear normal.  Mouth/Throat: Oropharynx is clear and moist. No oropharyngeal exudate.  Eyes: Conjunctivae and EOM are normal. Pupils are equal, round, and reactive to light.  Neck: Normal range of motion. Neck supple. No thyromegaly present.  Cardiovascular: Normal rate, regular rhythm, normal heart sounds and intact distal pulses.  Exam reveals no gallop.   No murmur heard. Pulmonary/Chest: Effort normal and breath sounds normal. No respiratory distress. He has no wheezes. He has no rales.  Abdominal: Soft. There is no tenderness.  Musculoskeletal: He exhibits no edema and no tenderness.  Lymphadenopathy:    He has no cervical adenopathy.  Neurological: He is alert and oriented to person, place, and time.  Skin: No rash noted. No erythema.  Psychiatric: He has a normal mood and affect. His behavior is normal.          Assessment & Plan:

## 2014-04-06 ENCOUNTER — Encounter: Payer: Self-pay | Admitting: Primary Care

## 2014-04-06 ENCOUNTER — Ambulatory Visit (INDEPENDENT_AMBULATORY_CARE_PROVIDER_SITE_OTHER): Payer: BLUE CROSS/BLUE SHIELD | Admitting: Primary Care

## 2014-04-06 ENCOUNTER — Encounter: Payer: Self-pay | Admitting: Internal Medicine

## 2014-04-06 VITALS — BP 122/72 | HR 64 | Temp 97.9°F | Ht 68.0 in | Wt 185.1 lb

## 2014-04-06 DIAGNOSIS — L719 Rosacea, unspecified: Secondary | ICD-10-CM

## 2014-04-06 DIAGNOSIS — R21 Rash and other nonspecific skin eruption: Secondary | ICD-10-CM | POA: Diagnosis not present

## 2014-04-06 MED ORDER — FLUOCINONIDE 0.05 % EX OINT: 1.0000 "application " | TOPICAL_OINTMENT | Freq: Two times a day (BID) | CUTANEOUS | Status: DC

## 2014-04-06 NOTE — Assessment & Plan Note (Addendum)
Left lower extremity with erythema and mild scaling. Suspect eczema. RX for Lidex ointment and advised only to use on legs and not face. Follow up if no resolve.

## 2014-04-06 NOTE — Progress Notes (Signed)
Pre visit review using our clinic review tool, if applicable. No additional management support is needed unless otherwise documented below in the visit note. 

## 2014-04-06 NOTE — Assessment & Plan Note (Signed)
Patient reports no change with metronidazole. Noticed pustular spots during exam, recommended benzol peroxide face wash once to twice daily.  Follow up in 2-3 weeks if no improvement.  Consider referral to dermatology.

## 2014-04-06 NOTE — Patient Instructions (Addendum)
Apply the Flucinonide ointment twice daily to skin rash on leg. Wash face daily with benzoyl peroxide. If no resolution of facial or leg rash within the next 2 weeks, please call for follow up appointment. I hope you feel better!

## 2014-04-06 NOTE — Progress Notes (Signed)
Subjective:    Patient ID: Willie Griffin, male    DOB: May 07, 1955, 59 y.o.   MRN: [id number]  HPI  Willie Griffin is a 59 year male who presents today with a chief complaint of rash. The rash is located to the left lower extremity and has been present since late summer 2015. The rash started out smaller but has grown and is now spreading to lateral calf and upper part of left lower extremity. He characterizes the rash as being itchy and dry without pain. He's tried applying lotion which helps with the itching, and triamcinolone acetonide cream (originally prescribed for athletes foot) which has not helped to reduce his rash. Nothing makes worse. Works outdoors Copy, denies knowledge of tick bites or being in wooded areas.  He also reports rash to face (bilateral cheeks) that has been evaluated and treated with Metronidazole. The Metronidozole has not provided relief so he stooped taking this several months ago. He says the rash on his cheeks waxes and wanes but doesn't entirely dissipate. Reports a recent increase of pustular spots.  Review of Systems  Constitutional: Negative for fever, chills, activity change and unexpected weight change.  HENT: Negative for rhinorrhea.   Respiratory: Negative for cough.   Gastrointestinal: Negative for nausea.  Musculoskeletal: Negative for myalgias.  Skin: Positive for color change and rash.  Allergic/Immunologic: Positive for environmental allergies.  Neurological: Negative for dizziness and headaches.  Hematological: Negative for adenopathy.       Past Medical History  Diagnosis Date  . Peyronie disease   . Allergy     allergic rhinitis    History   Social History  . Marital Status: Married    Spouse Name: N/A  . Number of Children: 2  . Years of Education: N/A   Occupational History  . Supervisor at [employer]  . Smoking status: Never Smoker   . Smokeless tobacco:  Never Used  . Alcohol Use: No  . Drug Use: Not on file  . Sexual Activity: Not on file   Other Topics Concern  . Not on file   Social History Narrative    Past Surgical History  Procedure Laterality Date  . Inguinal hernia repair  9/05  . Tonsillectomy  1972  . Toenail excision      partial    Family History  Problem Relation Age of Onset  . Heart attack Father   . Dementia Father     Lewy body dementia  . Arthritis Mother   . Cancer Paternal Aunt     jaw ca  . Coronary artery disease Paternal Grandfather   . Coronary artery disease Maternal Grandfather   . Suicidality Paternal Grandmother   . Breast cancer Paternal Grandmother   . Stroke Maternal Grandmother   . Diabetes Neg Hx   . Hypertension Neg Hx   . Hodgkin's lymphoma Sister   . Cancer Sister   . Celiac disease Paternal Uncle   . Other Sister     precancerous polyps  . Other Paternal Uncle     mesothelioma  . Breast cancer Other     No Known Allergies  Current Outpatient Prescriptions on File Prior to Visit  Medication Sig Dispense Refill  . hydrocortisone (PROCTOSOL HC) 2.5 % rectal cream APPLY RECTALLY 2 TIMES A DAY 30 g 2  . triamcinolone (KENALOG) 0.1 % cream Uses as directed 30 g 2  . metroNIDAZOLE (  METROGEL) 0.75 % gel Apply 1 application topically 2 (two) times daily. (Patient not taking: Reported on 04/06/2014) 45 g 11   No current facility-administered medications on file prior to visit.    BP 122/72 mmHg  Pulse 64  Temp(Src) 97.9 F (36.6 C) (Oral)  Ht 5\' 8"  (1.727 m)  Wt 185 lb 1.9 oz (83.97 kg)  BMI 28.15 kg/m2  SpO2 96%    Objective:   Physical Exam  Constitutional: He is oriented to person, place, and time. He appears well-developed.  HENT:  Head: Normocephalic.  Cardiovascular: Normal rate and regular rhythm.   Pulmonary/Chest: Effort normal and breath sounds normal.  Neurological: He is alert and oriented to person, place, and time.  Skin: Skin is warm and dry. Rash  noted.  3.5 cm rash present to anterior lower extremity just proximal to ankle. Scaling present with erythema. This appears to be eczema.  Redness with small pustular spots to bilateral cheeks.          Assessment & Plan:

## 2014-08-11 ENCOUNTER — Other Ambulatory Visit: Payer: Self-pay | Admitting: Internal Medicine

## 2014-08-11 NOTE — Telephone Encounter (Signed)
Approved:okay #30 gm x 1

## 2014-08-11 NOTE — Telephone Encounter (Signed)
Last prescribed on 10/19/12. Last seen on 04/06/14.

## 2014-11-01 ENCOUNTER — Ambulatory Visit (INDEPENDENT_AMBULATORY_CARE_PROVIDER_SITE_OTHER): Payer: Managed Care, Other (non HMO) | Admitting: Internal Medicine

## 2014-11-01 ENCOUNTER — Encounter: Payer: Self-pay | Admitting: Internal Medicine

## 2014-11-01 VITALS — BP 110/60 | HR 60 | Temp 98.4°F | Wt 182.0 lb

## 2014-11-01 DIAGNOSIS — R202 Paresthesia of skin: Secondary | ICD-10-CM | POA: Diagnosis not present

## 2014-11-01 DIAGNOSIS — L719 Rosacea, unspecified: Secondary | ICD-10-CM | POA: Diagnosis not present

## 2014-11-01 DIAGNOSIS — Z0000 Encounter for general adult medical examination without abnormal findings: Secondary | ICD-10-CM | POA: Diagnosis not present

## 2014-11-01 NOTE — Patient Instructions (Signed)
Please set up an appointment with a dermatologist. Please try wrist splints for driving, etc.

## 2014-11-01 NOTE — Progress Notes (Signed)
Pre visit review using our clinic review tool, if applicable. No additional management support is needed unless otherwise documented below in the visit note. 

## 2014-11-01 NOTE — Assessment & Plan Note (Signed)
Seems like early CTS Discussed wrist splints

## 2014-11-01 NOTE — Progress Notes (Signed)
Subjective:    Patient ID: Willie Griffin, male    DOB: 1955/08/18, 59 y.o.   MRN: [id number]  HPI Here for physical  Still with the facial rash metronidazole didn't help but triamcinolone helps some Discussed seeing derm due to not responding  Still has red spot on left calf Goes back a couple of years  Varicose veins--- occasional aching Got support hose--but doesn't wear in the summer with hose Mostly posterior left calf Slight edema by end of the day  Still has the hand numbness in left hand Median nerve distribution Intermittent but no clear cause--could be in AM, with driving, mowing, etc No regular hand weakness  Current Outpatient Prescriptions on File Prior to Visit  Medication Sig Dispense Refill  . PROCTOSOL HC 2.5 % rectal cream APPLY RECTALLY 2 TIMES A DAY 28 g 1  . triamcinolone (KENALOG) 0.1 % cream Uses as directed 30 g 2   No current facility-administered medications on file prior to visit.    No Known Allergies  Past Medical History  Diagnosis Date  . Peyronie disease   . Allergy     allergic rhinitis    Past Surgical History  Procedure Laterality Date  . Inguinal hernia repair  9/05  . Tonsillectomy  1972  . Toenail excision      partial    Family History  Problem Relation Age of Onset  . Heart attack Father   . Dementia Father     Lewy body dementia  . Arthritis Mother   . Cancer Paternal Aunt     jaw ca  . Coronary artery disease Paternal Grandfather   . Coronary artery disease Maternal Grandfather   . Suicidality Paternal Grandmother   . Breast cancer Paternal Grandmother   . Stroke Maternal Grandmother   . Diabetes Neg Hx   . Hypertension Neg Hx   . Hodgkin's lymphoma Sister   . Cancer Sister   . Celiac disease Paternal Uncle   . Other Sister     precancerous polyps  . Other Paternal Uncle     mesothelioma  . Breast cancer Other     Social History   Social History  . Marital Status: Married    Spouse Name: N/A  .  Number of Children: 2  . Years of Education: N/A   Occupational History  . 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 Smokeless tobacco: Never Used  . Alcohol Use: No  . Drug Use: Not on file  . Sexual Activity: Not on file   Other Topics Concern  . Not on file   Social History Narrative   Review of Systems  Constitutional: Negative for fatigue and unexpected weight change.       Wears seat belt  HENT: Negative for dental problem and hearing loss.        Occ left ear tinnitus Keeps up with dentist  Eyes:       Gets sense of "junk covering my eyes" at times Needs eye exam  Respiratory: Positive for cough. Negative for chest tightness and shortness of breath.        Still has cough in throat at times--?allergies?  Cardiovascular: Negative for chest pain and palpitations.  Gastrointestinal: Negative for blood in stool.       Rare heartburn--nothing regular Bouts of diarrhea at times---urgency. Okay now.  Endocrine: Negative for polydipsia and polyuria.  Genitourinary: Positive for difficulty urinating. Negative for urgency.       Nocturia x 2--stable Stream is slower No sexual problems--lower libido  Musculoskeletal: Positive for myalgias and arthralgias. Negative for back pain.       Various achy and joint pains Both 5th fingers hurt  Skin: Positive for rash.  Allergic/Immunologic: Positive for environmental allergies. Negative for immunocompromised state.  Neurological: Positive for numbness. Negative for dizziness, syncope, weakness, light-headedness and headaches.  Hematological: Negative for adenopathy. Bruises/bleeds easily.  Psychiatric/Behavioral: Negative for sleep disturbance and dysphoric mood. The patient is not nervous/anxious.        Objective:   Physical Exam  Constitutional: He is oriented to person, place, and time. He appears well-developed and well-nourished. No distress.    HENT:  Head: Normocephalic and atraumatic.  Right Ear: External ear normal.  Left Ear: External ear normal.  Mouth/Throat: Oropharynx is clear and moist. No oropharyngeal exudate.  Eyes: Conjunctivae and EOM are normal. Pupils are equal, round, and reactive to light.  Neck: Normal range of motion. Neck supple. No thyromegaly present.  Cardiovascular: Normal rate, regular rhythm, normal heart sounds and intact distal pulses.  Exam reveals no gallop.   No murmur heard. Pulmonary/Chest: Effort normal and breath sounds normal. No respiratory distress. He has no wheezes. He has no rales.  Abdominal: Soft. There is no tenderness.  Musculoskeletal: He exhibits no edema or tenderness.  Varicosities in right calf  Lymphadenopathy:    He has no cervical adenopathy.  Neurological: He is alert and oriented to person, place, and time.  Skin:  Red papular rash on nose and around Single red area on left anterior calf  Psychiatric: He has a normal mood and affect. His behavior is normal.          Assessment & Plan:

## 2014-11-01 NOTE — Assessment & Plan Note (Signed)
Not clear cut He will go to derm

## 2014-11-01 NOTE — Assessment & Plan Note (Signed)
Healthy Colon due 2019 Defer PSA for now--till at least next year He will get flu vaccine at work (and likely blood work)

## 2015-02-09 ENCOUNTER — Other Ambulatory Visit: Payer: Self-pay | Admitting: Orthopedic Surgery

## 2015-02-09 DIAGNOSIS — M5442 Lumbago with sciatica, left side: Secondary | ICD-10-CM

## 2015-02-14 ENCOUNTER — Ambulatory Visit: Payer: Managed Care, Other (non HMO)

## 2015-02-28 ENCOUNTER — Ambulatory Visit: Payer: Managed Care, Other (non HMO)

## 2015-03-01 ENCOUNTER — Ambulatory Visit: Payer: Managed Care, Other (non HMO)

## 2015-03-01 ENCOUNTER — Ambulatory Visit
Admission: RE | Admit: 2015-03-01 | Discharge: 2015-03-01 | Disposition: A | Discharge status: Home / Self Care | Payer: Managed Care, Other (non HMO) | Source: Ambulatory Visit | Attending: Orthopedic Surgery | Admitting: Orthopedic Surgery

## 2015-03-01 DIAGNOSIS — G8929 Other chronic pain: Secondary | ICD-10-CM | POA: Insufficient documentation

## 2015-03-01 DIAGNOSIS — M5442 Lumbago with sciatica, left side: Secondary | ICD-10-CM

## 2015-03-01 DIAGNOSIS — M5126 Other intervertebral disc displacement, lumbar region: Secondary | ICD-10-CM | POA: Diagnosis not present

## 2015-03-01 DIAGNOSIS — M47816 Spondylosis without myelopathy or radiculopathy, lumbar region: Secondary | ICD-10-CM | POA: Diagnosis not present

## 2015-03-01 DIAGNOSIS — M5136 Other intervertebral disc degeneration, lumbar region: Secondary | ICD-10-CM | POA: Diagnosis not present

## 2015-03-03 ENCOUNTER — Emergency Department
Admission: EM | Admit: 2015-03-03 | Discharge: 2015-03-03 | Disposition: A | Discharge status: Home / Self Care | Payer: Managed Care, Other (non HMO) | Attending: Emergency Medicine | Admitting: Emergency Medicine

## 2015-03-03 ENCOUNTER — Emergency Department: Payer: Managed Care, Other (non HMO)

## 2015-03-03 ENCOUNTER — Encounter: Payer: Self-pay | Admitting: Emergency Medicine

## 2015-03-03 DIAGNOSIS — Z79899 Other long term (current) drug therapy: Secondary | ICD-10-CM | POA: Diagnosis not present

## 2015-03-03 DIAGNOSIS — G8929 Other chronic pain: Secondary | ICD-10-CM | POA: Diagnosis not present

## 2015-03-03 DIAGNOSIS — Z7952 Long term (current) use of systemic steroids: Secondary | ICD-10-CM | POA: Diagnosis not present

## 2015-03-03 DIAGNOSIS — M5442 Lumbago with sciatica, left side: Secondary | ICD-10-CM | POA: Diagnosis not present

## 2015-03-03 DIAGNOSIS — K5900 Constipation, unspecified: Secondary | ICD-10-CM | POA: Diagnosis present

## 2015-03-03 DIAGNOSIS — K5909 Other constipation: Secondary | ICD-10-CM

## 2015-03-03 MED ORDER — KETOROLAC TROMETHAMINE 60 MG/2ML IM SOLN
60.0000 mg | Freq: Once | INTRAMUSCULAR | Status: AC
  Administered 2015-03-03: 60 mg via INTRAMUSCULAR
  Filled 2015-03-03: qty 2

## 2015-03-03 MED ORDER — METHOCARBAMOL 500 MG PO TABS: 500.0000 mg | ORAL_TABLET | Freq: Four times a day (QID) | ORAL | Status: DC

## 2015-03-03 MED ORDER — METHOCARBAMOL 500 MG PO TABS
1000.0000 mg | ORAL_TABLET | Freq: Once | ORAL | Status: AC
  Administered 2015-03-03: 1000 mg via ORAL
  Filled 2015-03-03: qty 2

## 2015-03-03 NOTE — ED Provider Notes (Signed)
[facility] Emergency Department Provider Note  ____________________________________________  Time seen: Approximately 8:57 AM  I have reviewed the triage vital signs and the nursing notes.   HISTORY  Chief Complaint Back Pain    HPI Willie Griffin is a 60 y.o. male who presents emergency department complaining of worsening of his sciatica. Patient states that he is being treated for chronic back pain, no clinic and had a MRI 2 days prior. Patient states that his symptoms to include lower back pain as well as left-sided radicular symptoms have increased over the last 2 days. Patient states that pain is "severe." Patient denies any changes in urination to include hematuria, dysuria, polyuria. Patient does endorse some intermittent constipation. Patient states he was constipated "a couple of days ago. He did have a normal bowel movement yesterday but states that he "tried to go to the bathroom but couldn't and this morning due to the pain." Patient denies any fevers or chills, headaches, saddle anesthesia, bowel or bladder incontinence.   Past Medical History  Diagnosis Date  . Peyronie disease   . Allergy     allergic rhinitis    Patient Active Problem List   Diagnosis Date Noted  . Rash and nonspecific skin eruption 04/06/2014  . Rosacea 10/21/2013  . Numbness and tingling in hands 10/15/2011  . Routine general medical examination at a health care facility 08/09/2010  . ALLERGIC RHINITIS 03/17/2007  . Peyronie's disease 02/19/2007    Past Surgical History  Procedure Laterality Date  . Inguinal hernia repair  9/05  . Tonsillectomy  1972  . Toenail excision      partial    Current Outpatient Rx  Name  Route  Sig  Dispense  Refill  . methocarbamol (ROBAXIN) 500 MG tablet   Oral   Take 1 tablet (500 mg total) by mouth 4 (four) times daily.   16 tablet   0   . PROCTOSOL HC 2.5 % rectal cream      APPLY RECTALLY 2 TIMES A DAY   28 g   1   .  triamcinolone (KENALOG) 0.1 % cream      Uses as directed   30 g   2     Allergies Review of patient's allergies indicates no known allergies.  Family History  Problem Relation Age of Onset  . Heart attack Father   . Dementia Father     Lewy body dementia  . Arthritis Mother   . Cancer Paternal Aunt     jaw ca  . Coronary artery disease Paternal Grandfather   . Coronary artery disease Maternal Grandfather   . Suicidality Paternal Grandmother   . Breast cancer Paternal Grandmother   . Stroke Maternal Grandmother   . Diabetes Neg Hx   . Hypertension Neg Hx   . Hodgkin's lymphoma Sister   . Cancer Sister   . Celiac disease Paternal Uncle   . Other Sister     precancerous polyps  . Other Paternal Uncle     mesothelioma  . Breast cancer Other     Social History Social History  Substance Use Topics  . Smoking status: Never Smoker   . Smokeless tobacco: Never Used  . Alcohol Use: No     Review of Systems  Constitutional: No fever/chills Cardiovascular: no chest pain. Respiratory: no cough. No SOB. Gastrointestinal: No abdominal pain.  No nausea, no vomiting.  No diarrhea.  Intermittent constipation. Genitourinary: Negative for dysuria. No hematuria Musculoskeletal: Positive for lower  back pain. Skin: Negative for rash. Neurological: Negative for headaches, focal weakness or numbness. Positive for left lower extremity radicular symptoms. 10-point ROS otherwise negative.  ____________________________________________   PHYSICAL EXAM:  VITAL SIGNS: ED Triage Vitals  Enc Vitals Group     BP 03/03/15 0834 131/75 mmHg     Pulse Rate 03/03/15 0834 70     Resp 03/03/15 0834 18     Temp 03/03/15 0834 98.7 F (37.1 C)     Temp Source 03/03/15 0834 Oral     SpO2 03/03/15 0834 98 %     Weight 03/03/15 0834 185 lb (83.915 kg)     Height 03/03/15 0834 5\' 8"  (1.727 m)     Head Cir --      Peak Flow --      Pain Score --      Pain Loc --      Pain Edu? --       Excl. in [place]? --      Constitutional: Alert and oriented. Well appearing and in no acute distress. Eyes: Conjunctivae are normal. PERRL. EOMI. Head: Atraumatic.  Neck: No stridor.  No cervical spine tenderness to palpation. Cardiovascular: Normal rate, regular rhythm. Normal S1 and S2.  Good peripheral circulation. Respiratory: Normal respiratory effort without tachypnea or retractions. Lungs CTAB. Gastrointestinal: Bowel sounds 4 quadrants. Soft to palpation. Mild tenderness to palpation left lower quadrant. No rigidity or guarding. No palpable masses.. No distention. No CVA tenderness. Musculoskeletal: No lower extremity tenderness nor edema.  No joint effusions. No visible deformity to spine upon inspection. Patient is tender to palpation midline in the lumbar region. He is also diffuse tender palpation over the left sided paraspinal muscle group. Spasms are noted to the left paraspinal muscle group.. Patient is tender to palpation in the sciatic notch. Patient has slightly decreased sensation in the left lower extremity when compared with right. Dorsalis pedis pulses appreciated bilaterally. Neurologic:  Normal speech and language. No gross focal neurologic deficits are appreciated.  Skin:  Skin is warm, dry and intact. No rash noted. Psychiatric: Mood and affect are normal. Speech and behavior are normal. Patient exhibits appropriate insight and judgement.   ____________________________________________   LABS (all labs ordered are listed, but only abnormal results are displayed)  Labs Reviewed - No data to display ____________________________________________  EKG   ____________________________________________  RADIOLOGY   MRI from 03/01/2015 is reviewed. MRI shows minor impingement the L3-L4 as well as L4-L5 range.  Dg Abd 2 Views  03/03/2015  CLINICAL DATA:  Back pain. EXAM: ABDOMEN - 2 VIEW COMPARISON:  None. FINDINGS: The bowel gas pattern is normal. There is no evidence of  free air. No radio-opaque calculi or other significant radiographic abnormality is seen. IMPRESSION: No evidence of bowel obstruction or ileus. Electronically Signed   By: Marijo Conception, M.D.   On: 03/03/2015 09:40    ____________________________________________    PROCEDURES  Procedure(s) performed:       Medications  ketorolac (TORADOL) injection 60 mg (60 mg Intramuscular Given 03/03/15 0931)  methocarbamol (ROBAXIN) tablet 1,000 mg (1,000 mg Oral Given 03/03/15 0931)     ____________________________________________   INITIAL IMPRESSION / ASSESSMENT AND PLAN / ED COURSE  Pertinent labs & imaging results that were available during my care of the patient were reviewed by me and considered in my medical decision making (see chart for details).  Patient's diagnosis is consistent with known lumbago with sciatica. This is a worsening of symptoms and patient is treated  with oral injection and muscle relaxer here in the emergency department. Patient does have a history of intermittent constipation and x-ray was ordered. There is no significant constipation and no obstruction at this time. Patient is receiving pain medications from his orthopedic surgeon and at this time no further narcotics will be prescribed. Patient will be instructed to follow-up with his orthopedic surgeon for this worsening of symptoms. Patient had an MRI of his lumbar spine which was reviewed here in the emergency department and did show impingement in the lumbar region. Patient does not have any indication of neurologic damage to include saddle anesthesia, bowel or bladder dysfunction, or paresthesias. Patient will be discharged home with prescription for a muscle relaxer. Patient is a already taking meloxicam at home. Patient is given ED precautions to return to the ED for any worsening or new symptoms.     ____________________________________________  FINAL CLINICAL IMPRESSION(S) / ED DIAGNOSES  Final  diagnoses:  Intermittent constipation  Chronic midline low back pain with left-sided sciatica      NEW MEDICATIONS STARTED DURING THIS VISIT:  New Prescriptions   METHOCARBAMOL (ROBAXIN) 500 MG TABLET    Take 1 tablet (500 mg total) by mouth 4 (four) times daily.       Charline Bills Cuthriell, PA-C 03/03/15 [id number]  Lavonia Drafts, MD 03/03/15 1536

## 2015-03-03 NOTE — ED Notes (Signed)
Patient transported to X-ray 

## 2015-03-03 NOTE — ED Notes (Signed)
Patient presents to the ED with "sciatica".  Patient reports being treated for chronic back pain at [facility] and states he recently had an MRI and is scheduled for an epidural.  Patient states in the past two days the pain has increased to the point where the patient cannot walk.  Patient states pain radiates down his left leg.

## 2015-03-03 NOTE — Discharge Instructions (Signed)
Chronic Back Pain ° When back pain lasts longer than 3 months, it is called chronic back pain. People with chronic back pain often go through certain periods that are more intense (flare-ups).  °CAUSES °Chronic back pain can be caused by wear and tear (degeneration) on different structures in your back. These structures include: °· The bones of your spine (vertebrae) and the joints surrounding your spinal cord and nerve roots (facets). °· The strong, fibrous tissues that connect your vertebrae (ligaments). °Degeneration of these structures may result in pressure on your nerves. This can lead to constant pain. °HOME CARE INSTRUCTIONS °· Avoid bending, heavy lifting, prolonged sitting, and activities which make the problem worse. °· Take brief periods of rest throughout the day to reduce your pain. Lying down or standing usually is better than sitting while you are resting. °· Take over-the-counter or prescription medicines only as directed by your caregiver. °SEEK IMMEDIATE MEDICAL CARE IF:  °· You have weakness or numbness in one of your legs or feet. °· You have trouble controlling your bladder or bowels. °· You have nausea, vomiting, abdominal pain, shortness of breath, or fainting. °  °This information is not intended to replace advice given to you by your health care provider. Make sure you discuss any questions you have with your health care provider. °  °Document Released: 02/21/2004 Document Revised: 04/07/2011 Document Reviewed: 07/03/2014 °Elsevier Interactive Patient Education ©2016 [employer] Inc[employer] ° °Sciatica °Sciatica is pain, weakness, numbness, or tingling along the path of the sciatic nerve. The nerve starts in the lower back and runs down the back of each leg. The nerve controls the muscles in the lower leg and in the back of the knee, while also providing sensation to the back of the thigh, lower leg, and the sole of your foot. Sciatica is a symptom of another medical condition. For instance, nerve  damage or certain conditions, such as a herniated disk or bone spur on the spine, pinch or put pressure on the sciatic nerve. This causes the pain, weakness, or other sensations normally associated with sciatica. Generally, sciatica only affects one side of the body. °CAUSES  °· Herniated or slipped disc. °· Degenerative disk disease. °· A pain disorder involving the narrow muscle in the buttocks (piriformis syndrome). °· Pelvic injury or fracture. °· Pregnancy. °· Tumor (rare). °SYMPTOMS  °Symptoms can vary from mild to very severe. The symptoms usually travel from the low back to the buttocks and down the back of the leg. Symptoms can include: °· Mild tingling or dull aches in the lower back, leg, or hip. °· Numbness in the back of the calf or sole of the foot. °· Burning sensations in the lower back, leg, or hip. °· Sharp pains in the lower back, leg, or hip. °· Leg weakness. °· Severe back pain inhibiting movement. °These symptoms may get worse with coughing, sneezing, laughing, or prolonged sitting or standing. Also, being overweight may worsen symptoms. °DIAGNOSIS  °Your caregiver will perform a physical exam to look for common symptoms of sciatica. He or she may ask you to do certain movements or activities that would trigger sciatic nerve pain. Other tests may be performed to find the cause of the sciatica. These may include: °· Blood tests. °· X-rays. °· Imaging tests, such as an MRI or CT scan. °TREATMENT  °Treatment is directed at the cause of the sciatic pain. Sometimes, treatment is not necessary and the pain and discomfort goes away on its own. If treatment is needed, your   caregiver may suggest: °· Over-the-counter medicines to relieve pain. °· Prescription medicines, such as anti-inflammatory medicine, muscle relaxants, or narcotics. °· Applying heat or ice to the painful area. °· Steroid injections to lessen pain, irritation, and inflammation around the nerve. °· Reducing activity during periods of  pain. °· Exercising and stretching to strengthen your abdomen and improve flexibility of your spine. Your caregiver may suggest losing weight if the extra weight makes the back pain worse. °· Physical therapy. °· Surgery to eliminate what is pressing or pinching the nerve, such as a bone spur or part of a herniated disk. °HOME CARE INSTRUCTIONS  °· Only take over-the-counter or prescription medicines for pain or discomfort as directed by your caregiver. °· Apply ice to the affected area for 20 minutes, 3-4 times a day for the first 48-72 hours. Then try heat in the same way. °· Exercise, stretch, or perform your usual activities if these do not aggravate your pain. °· Attend physical therapy sessions as directed by your caregiver. °· Keep all follow-up appointments as directed by your caregiver. °· Do not wear high heels or shoes that do not provide proper support. °· Check your mattress to see if it is too soft. A firm mattress may lessen your pain and discomfort. °SEEK IMMEDIATE MEDICAL CARE IF:  °· You lose control of your bowel or bladder (incontinence). °· You have increasing weakness in the lower back, pelvis, buttocks, or legs. °· You have redness or swelling of your back. °· You have a burning sensation when you urinate. °· You have pain that gets worse when you lie down or awakens you at night. °· Your pain is worse than you have experienced in the past. °· Your pain is lasting longer than 4 weeks. °· You are suddenly losing weight without reason. °MAKE SURE YOU: °· Understand these instructions. °· Will watch your condition. °· Will get help right away if you are not doing well or get worse. °  °This information is not intended to replace advice given to you by your health care provider. Make sure you discuss any questions you have with your health care provider. °  °Document Released: 01/07/2001 Document Revised: 10/04/2014 Document Reviewed: 05/25/2011 °Elsevier Interactive Patient Education ©2016  [employer]vier Inc[employer] ° °

## 2015-03-05 ENCOUNTER — Other Ambulatory Visit: Payer: Self-pay | Admitting: Orthopedic Surgery

## 2015-03-05 DIAGNOSIS — G8929 Other chronic pain: Secondary | ICD-10-CM

## 2015-03-05 DIAGNOSIS — M545 Low back pain, unspecified: Secondary | ICD-10-CM

## 2015-03-12 ENCOUNTER — Ambulatory Visit
Admission: RE | Admit: 2015-03-12 | Discharge: 2015-03-12 | Disposition: A | Discharge status: Home / Self Care | Payer: Managed Care, Other (non HMO) | Source: Ambulatory Visit | Attending: Orthopedic Surgery | Admitting: Orthopedic Surgery

## 2015-03-12 DIAGNOSIS — M545 Low back pain, unspecified: Secondary | ICD-10-CM

## 2015-03-12 DIAGNOSIS — G8929 Other chronic pain: Secondary | ICD-10-CM

## 2015-03-12 MED ORDER — METHYLPREDNISOLONE ACETATE 40 MG/ML INJ SUSP (RADIOLOG
120.0000 mg | Freq: Once | INTRAMUSCULAR | Status: AC
  Administered 2015-03-12: 120 mg via EPIDURAL

## 2015-03-12 MED ORDER — IOHEXOL 180 MG/ML  SOLN
1.0000 mL | Freq: Once | INTRAMUSCULAR | Status: AC | PRN
  Administered 2015-03-12: 1 mL via EPIDURAL

## 2015-03-12 NOTE — Discharge Instructions (Signed)

## 2015-04-12 ENCOUNTER — Other Ambulatory Visit: Payer: Self-pay | Admitting: Unknown Physician Specialty

## 2015-04-12 DIAGNOSIS — M5126 Other intervertebral disc displacement, lumbar region: Secondary | ICD-10-CM

## 2015-04-24 DIAGNOSIS — M5416 Radiculopathy, lumbar region: Secondary | ICD-10-CM | POA: Insufficient documentation

## 2015-04-27 ENCOUNTER — Other Ambulatory Visit: Payer: Self-pay | Admitting: Orthopedic Surgery

## 2015-04-27 DIAGNOSIS — M5416 Radiculopathy, lumbar region: Secondary | ICD-10-CM

## 2015-05-08 ENCOUNTER — Other Ambulatory Visit: Payer: Self-pay | Admitting: Orthopedic Surgery

## 2015-05-08 ENCOUNTER — Ambulatory Visit
Admission: RE | Admit: 2015-05-08 | Discharge: 2015-05-08 | Disposition: A | Discharge status: Home / Self Care | Payer: Managed Care, Other (non HMO) | Source: Ambulatory Visit | Attending: Orthopedic Surgery | Admitting: Orthopedic Surgery

## 2015-05-08 DIAGNOSIS — M5416 Radiculopathy, lumbar region: Secondary | ICD-10-CM

## 2015-05-08 MED ORDER — IOHEXOL 180 MG/ML  SOLN
1.0000 mL | Freq: Once | INTRAMUSCULAR | Status: AC | PRN
  Administered 2015-05-08: 1 mL via EPIDURAL

## 2015-05-08 MED ORDER — METHYLPREDNISOLONE ACETATE 40 MG/ML INJ SUSP (RADIOLOG
120.0000 mg | Freq: Once | INTRAMUSCULAR | Status: AC
  Administered 2015-05-08: 120 mg via EPIDURAL

## 2015-05-08 NOTE — Discharge Instructions (Signed)

## 2015-06-26 ENCOUNTER — Other Ambulatory Visit: Payer: Self-pay | Admitting: Orthopedic Surgery

## 2015-06-26 DIAGNOSIS — M5416 Radiculopathy, lumbar region: Secondary | ICD-10-CM

## 2015-07-06 ENCOUNTER — Other Ambulatory Visit: Payer: Self-pay | Admitting: Orthopedic Surgery

## 2015-07-06 ENCOUNTER — Ambulatory Visit
Admission: RE | Admit: 2015-07-06 | Discharge: 2015-07-06 | Disposition: A | Discharge status: Home / Self Care | Payer: Managed Care, Other (non HMO) | Source: Ambulatory Visit | Attending: Orthopedic Surgery | Admitting: Orthopedic Surgery

## 2015-07-06 DIAGNOSIS — M5416 Radiculopathy, lumbar region: Secondary | ICD-10-CM

## 2015-07-06 MED ORDER — METHYLPREDNISOLONE ACETATE 40 MG/ML INJ SUSP (RADIOLOG
120.0000 mg | Freq: Once | INTRAMUSCULAR | Status: AC
  Administered 2015-07-06: 120 mg via EPIDURAL

## 2015-07-06 MED ORDER — IOPAMIDOL (ISOVUE-M 200) INJECTION 41%
1.0000 mL | Freq: Once | INTRAMUSCULAR | Status: AC
  Administered 2015-07-06: 1 mL via EPIDURAL

## 2015-08-29 ENCOUNTER — Other Ambulatory Visit: Payer: Self-pay | Admitting: Orthopedic Surgery

## 2015-08-29 DIAGNOSIS — M5416 Radiculopathy, lumbar region: Secondary | ICD-10-CM

## 2015-09-14 ENCOUNTER — Other Ambulatory Visit: Payer: Self-pay | Admitting: Internal Medicine

## 2015-09-18 ENCOUNTER — Ambulatory Visit
Admission: RE | Admit: 2015-09-18 | Discharge: 2015-09-18 | Disposition: A | Discharge status: Home / Self Care | Payer: Managed Care, Other (non HMO) | Source: Ambulatory Visit | Attending: Orthopedic Surgery | Admitting: Orthopedic Surgery

## 2015-09-18 ENCOUNTER — Other Ambulatory Visit: Payer: Self-pay | Admitting: Orthopedic Surgery

## 2015-09-18 DIAGNOSIS — M5416 Radiculopathy, lumbar region: Secondary | ICD-10-CM

## 2015-09-18 MED ORDER — IOPAMIDOL (ISOVUE-M 200) INJECTION 41%
1.0000 mL | Freq: Once | INTRAMUSCULAR | Status: AC
  Administered 2015-09-18: 1 mL via EPIDURAL

## 2015-09-18 MED ORDER — METHYLPREDNISOLONE ACETATE 40 MG/ML INJ SUSP (RADIOLOG
120.0000 mg | Freq: Once | INTRAMUSCULAR | Status: AC
  Administered 2015-09-18: 120 mg via EPIDURAL

## 2015-09-18 NOTE — Discharge Instructions (Signed)

## 2015-11-07 ENCOUNTER — Encounter: Payer: Self-pay | Admitting: Internal Medicine

## 2015-11-07 ENCOUNTER — Ambulatory Visit (INDEPENDENT_AMBULATORY_CARE_PROVIDER_SITE_OTHER): Payer: Managed Care, Other (non HMO) | Admitting: Internal Medicine

## 2015-11-07 VITALS — BP 106/74 | HR 67 | Temp 98.1°F | Ht 68.0 in | Wt 179.0 lb

## 2015-11-07 DIAGNOSIS — Z0000 Encounter for general adult medical examination without abnormal findings: Secondary | ICD-10-CM | POA: Diagnosis not present

## 2015-11-07 DIAGNOSIS — Z125 Encounter for screening for malignant neoplasm of prostate: Secondary | ICD-10-CM

## 2015-11-07 DIAGNOSIS — L719 Rosacea, unspecified: Secondary | ICD-10-CM | POA: Diagnosis not present

## 2015-11-07 DIAGNOSIS — W57XXXA Bitten or stung by nonvenomous insect and other nonvenomous arthropods, initial encounter: Secondary | ICD-10-CM

## 2015-11-07 DIAGNOSIS — R5383 Other fatigue: Secondary | ICD-10-CM | POA: Diagnosis not present

## 2015-11-07 LAB — CBC WITH DIFFERENTIAL/PLATELET
Basophils Absolute: 0 10*3/uL (ref 0.0–0.1)
Basophils Relative: 0.2 % (ref 0.0–3.0)
Eosinophils Absolute: 0.2 10*3/uL (ref 0.0–0.7)
Eosinophils Relative: 2.3 % (ref 0.0–5.0)
HCT: 48.3 % (ref 39.0–52.0)
Hemoglobin: 16.1 g/dL (ref 13.0–17.0)
Lymphocytes Relative: 21.2 % (ref 12.0–46.0)
Lymphs Abs: 1.7 10*3/uL (ref 0.7–4.0)
MCHC: 33.3 g/dL (ref 30.0–36.0)
MCV: 91.9 fl (ref 78.0–100.0)
Monocytes Absolute: 0.7 10*3/uL (ref 0.1–1.0)
Monocytes Relative: 8.3 % (ref 3.0–12.0)
Neutro Abs: 5.4 10*3/uL (ref 1.4–7.7)
Neutrophils Relative %: 68 % (ref 43.0–77.0)
Platelets: 308 10*3/uL (ref 150.0–400.0)
RBC: 5.25 Mil/uL (ref 4.22–5.81)
RDW: 12.4 % (ref 11.5–15.5)
WBC: 8 10*3/uL (ref 4.0–10.5)

## 2015-11-07 LAB — COMPREHENSIVE METABOLIC PANEL
ALT: 34 U/L (ref 0–53)
AST: 23 U/L (ref 0–37)
Albumin: 4.1 g/dL (ref 3.5–5.2)
Alkaline Phosphatase: 63 U/L (ref 39–117)
BUN: 17 mg/dL (ref 6–23)
CO2: 28 mEq/L (ref 19–32)
Calcium: 9.4 mg/dL (ref 8.4–10.5)
Chloride: 104 mEq/L (ref 96–112)
Creatinine, Ser: 0.93 mg/dL (ref 0.40–1.50)
GFR: 88.48 mL/min (ref >60.00)
Glucose, Bld: 94 mg/dL (ref 70–99)
Potassium: 4.3 mEq/L (ref 3.5–5.1)
Sodium: 142 mEq/L (ref 135–145)
Total Bilirubin: 0.8 mg/dL (ref 0.2–1.2)
Total Protein: 6.5 g/dL (ref 6.0–8.3)

## 2015-11-07 LAB — T4, FREE: Free T4: 0.58 ng/dL — ABNORMAL LOW (ref 0.60–1.60)

## 2015-11-07 LAB — PSA: PSA: 2.04 ng/mL (ref 0.10–4.00)

## 2015-11-07 MED ORDER — TRIAMCINOLONE ACETONIDE 0.1 % EX CREA: TOPICAL_CREAM | CUTANEOUS | 2 refills | Status: DC

## 2015-11-07 NOTE — Progress Notes (Signed)
Pre visit review using our clinic review tool, if applicable. No additional management support is needed unless otherwise documented below in the visit note. 

## 2015-11-07 NOTE — Assessment & Plan Note (Signed)
Healthy but needs to work on fitness Flu vaccine at work Will check PSA after discussion Colon due 2019

## 2015-11-07 NOTE — Assessment & Plan Note (Signed)
May be element of seb dermatitis Asked him to see dermatologist

## 2015-11-07 NOTE — Patient Instructions (Addendum)
Please set up with a dermatologist to decide on treatment for your ongoing face rash. Please try to walk regularly and try a core strengthening program.

## 2015-11-07 NOTE — Assessment & Plan Note (Signed)
May be related to the back pain and inactivity Will check labs Asked him to try walking and core work

## 2015-11-07 NOTE — Progress Notes (Signed)
Subjective:    Patient ID: Willie Griffin, male    DOB: 1955/07/04, 60 y.o.   MRN: [id number]  HPI Here for physical  Has had ESI for lumbar radiculopathy First one did help pain and reduce the numbness Second and third ones also helped [place] shot didn't really help--but he has improved since that time No regular back work outs  Still uses the cream on his face Discussed that this isn't recommended for face regularly Asked him to see derm (?oral antibiotics) He does use Head and Shoulders (but doesn't keep it on for long)  Current Outpatient Prescriptions on File Prior to Visit  Medication Sig Dispense Refill  . PROCTOSOL HC 2.5 % rectal cream APPLY RECTALLY 2 TIMES A DAY 28 g 0  . triamcinolone (KENALOG) 0.1 % cream Uses as directed 30 g 2   No current facility-administered medications on file prior to visit.     No Known Allergies  Past Medical History:  Diagnosis Date  . Allergy    allergic rhinitis  . Peyronie disease     Past Surgical History:  Procedure Laterality Date  . INGUINAL HERNIA REPAIR  9/05  . TOENAIL EXCISION     partial  . TONSILLECTOMY  1972    Family History  Problem Relation Age of Onset  . Heart attack Father   . Dementia Father     Lewy body dementia  . Arthritis Mother   . Cancer Paternal Aunt     jaw ca  . Coronary artery disease Paternal Grandfather   . Coronary artery disease Maternal Grandfather   . Suicidality Paternal Grandmother   . Breast cancer Paternal Grandmother   . Stroke Maternal Grandmother   . Diabetes Neg Hx   . Hypertension Neg Hx   . Hodgkin's lymphoma Sister   . Cancer Sister   . Celiac disease Paternal Uncle   . Other Sister     precancerous polyps  . Other Paternal Uncle     mesothelioma  . Breast cancer Other     Social History   Social History  . Marital status: Married    Spouse name: N/A  . Number of children: 2  . Years of education: N/A   Occupational History  . Supervisor at [employer]  . 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 Smokeless tobacco: Never Used  . Alcohol use No  . Drug use: No  . Sexual activity: Not on file   Other Topics Concern  . Not on file   Social History Narrative  . No narrative on file   Review of Systems  Constitutional: Positive for fatigue. Negative for unexpected weight change.       Energy levels have dropped  No regular exercise for a year Wears seat belt Weight stable--careful with eating  HENT: Positive for tinnitus. Negative for dental problem and hearing loss.        Keeps up with dentist Will have rare trouble swallowing breakfast---not in a long time  Eyes: Positive for visual disturbance.       Some change in light sensitivity-- and peripheral changes. Work up negative by Dr Ellin Mayhew  Respiratory: Positive for chest tightness and shortness of breath. Negative for cough.        Had attack after being bit by bees (he does keep them)-- some chest tightness and SOB---resolved with benedryl (now has better equipment)  Cardiovascular: Negative  for chest pain and palpitations.       Slight left leg swelling  Gastrointestinal: Negative for nausea and vomiting.       Some constipation--- ?from the shots. Now is getting better again No heartburn  Endocrine: Negative for polydipsia and polyuria.  Genitourinary: Negative for difficulty urinating and frequency.       Some increased nocturia-- 3-4 per night now No sig ED now  Musculoskeletal: Positive for arthralgias and back pain. Negative for joint swelling.       Uses ibuprofen 400mg  prn (not often)  Skin: Positive for rash.  Allergic/Immunologic: Positive for environmental allergies. Negative for immunocompromised state.       Uses 1/4 zyrtec at night  Neurological: Positive for headaches. Negative for dizziness, syncope and light-headedness.  Hematological: Negative for adenopathy. Bruises/bleeds easily.  Psychiatric/Behavioral:  Negative for dysphoric mood. The patient is not nervous/anxious.        Frequent awakening but usually sleeps okay overall       Objective:   Physical Exam  Constitutional: He is oriented to person, place, and time. He appears well-developed and well-nourished. No distress.  HENT:  Head: Normocephalic and atraumatic.  Right Ear: External ear normal.  Left Ear: External ear normal.  Mouth/Throat: Oropharynx is clear and moist. No oropharyngeal exudate.  Eyes: Conjunctivae are normal. Pupils are equal, round, and reactive to light.  Neck: Normal range of motion. Neck supple. No thyromegaly present.  Cardiovascular: Normal rate, regular rhythm, normal heart sounds and intact distal pulses.  Exam reveals no gallop.   No murmur heard. Pulmonary/Chest: Effort normal and breath sounds normal. No respiratory distress. He has no wheezes. He has no rales.  Abdominal: Soft. There is no tenderness.  Musculoskeletal: He exhibits no edema or tenderness.  Mildly dilated veins in upper left calf (and some lower)  Lymphadenopathy:    He has no cervical adenopathy.  Neurological: He is alert and oriented to person, place, and time.  Skin: No rash noted. No erythema.  Psychiatric: He has a normal mood and affect. His behavior is normal.          Assessment & Plan:

## 2015-11-08 ENCOUNTER — Other Ambulatory Visit: Payer: Self-pay | Admitting: Internal Medicine

## 2015-11-08 DIAGNOSIS — R946 Abnormal results of thyroid function studies: Secondary | ICD-10-CM

## 2015-11-08 LAB — ALLERGEN HYMENOPTERA PANEL
Honey Bee IgE: 14.6 kU/L — ABNORMAL HIGH
Paper Wasp IgE: 1.81 kU/L — ABNORMAL HIGH
White Hornet IgE: 0.36 kU/L — ABNORMAL HIGH
Yellow Hornet IgE: 0.14 kU/L — ABNORMAL HIGH
Yellow Jacket IgE: 0.79 kU/L — ABNORMAL HIGH

## 2015-11-09 ENCOUNTER — Other Ambulatory Visit: Payer: Self-pay | Admitting: Internal Medicine

## 2015-11-09 DIAGNOSIS — T63481D Toxic effect of venom of other arthropod, accidental (unintentional), subsequent encounter: Secondary | ICD-10-CM

## 2015-11-09 MED ORDER — EPINEPHRINE 0.3 MG/0.3ML IJ SOAJ: 0.3000 mg | Freq: Once | INTRAMUSCULAR | 1 refills | Status: DC | PRN

## 2015-11-09 NOTE — Addendum Note (Signed)
Addended by: Pilar Grammes on: 11/09/2015 02:22 PM   Modules accepted: Orders

## 2015-11-16 ENCOUNTER — Other Ambulatory Visit (INDEPENDENT_AMBULATORY_CARE_PROVIDER_SITE_OTHER): Payer: Managed Care, Other (non HMO)

## 2015-11-16 ENCOUNTER — Other Ambulatory Visit: Payer: Self-pay

## 2015-11-16 ENCOUNTER — Other Ambulatory Visit: Payer: Self-pay | Admitting: Internal Medicine

## 2015-11-16 DIAGNOSIS — R946 Abnormal results of thyroid function studies: Secondary | ICD-10-CM | POA: Diagnosis not present

## 2015-11-16 DIAGNOSIS — E039 Hypothyroidism, unspecified: Secondary | ICD-10-CM

## 2015-11-16 LAB — TSH: TSH: 1.63 u[IU]/mL (ref 0.35–4.50)

## 2015-11-16 LAB — T4, FREE: Free T4: 0.52 ng/dL — ABNORMAL LOW (ref 0.60–1.60)

## 2015-11-16 MED ORDER — LEVOTHYROXINE SODIUM 25 MCG PO TABS: 25.0000 ug | ORAL_TABLET | Freq: Every day | ORAL | 1 refills | Status: DC

## 2015-11-16 NOTE — Telephone Encounter (Signed)
Per lab results, pt is to start levothyroxine 50mcg. He said to send it to [facility].

## 2015-12-11 ENCOUNTER — Telehealth: Payer: Self-pay

## 2015-12-11 NOTE — Telephone Encounter (Signed)
Pt wants to know since pt only has the one refill on the levothyroxine that was started in 10/2015 will pt have to continue med. Advised pt he is to have repeat labs 01/01/16 and at that time levothyroxine dosage may need to be adjusted or not depending on lab report. Pt voiced understanding and nothing further needed.

## 2016-01-01 ENCOUNTER — Other Ambulatory Visit (INDEPENDENT_AMBULATORY_CARE_PROVIDER_SITE_OTHER): Payer: Managed Care, Other (non HMO)

## 2016-01-01 DIAGNOSIS — E039 Hypothyroidism, unspecified: Secondary | ICD-10-CM

## 2016-01-01 LAB — T4, FREE: Free T4: 0.71 ng/dL (ref 0.60–1.60)

## 2016-01-01 LAB — TSH: TSH: 0.33 u[IU]/mL — ABNORMAL LOW (ref 0.35–4.50)

## 2016-01-07 ENCOUNTER — Other Ambulatory Visit: Payer: Self-pay | Admitting: Internal Medicine

## 2016-01-07 ENCOUNTER — Encounter: Payer: Self-pay | Admitting: Internal Medicine

## 2016-01-07 MED ORDER — LEVOTHYROXINE SODIUM 25 MCG PO TABS: 25.0000 ug | ORAL_TABLET | Freq: Every day | ORAL | 11 refills | Status: DC

## 2016-06-16 ENCOUNTER — Other Ambulatory Visit: Payer: Self-pay | Admitting: Orthopedic Surgery

## 2016-06-17 ENCOUNTER — Other Ambulatory Visit: Payer: Self-pay | Admitting: Orthopedic Surgery

## 2016-06-17 DIAGNOSIS — M5126 Other intervertebral disc displacement, lumbar region: Secondary | ICD-10-CM

## 2016-06-17 DIAGNOSIS — M5416 Radiculopathy, lumbar region: Secondary | ICD-10-CM

## 2016-06-17 DIAGNOSIS — M5442 Lumbago with sciatica, left side: Secondary | ICD-10-CM

## 2016-06-17 DIAGNOSIS — M5136 Other intervertebral disc degeneration, lumbar region: Secondary | ICD-10-CM

## 2016-06-28 ENCOUNTER — Ambulatory Visit
Admission: RE | Admit: 2016-06-28 | Discharge: 2016-06-28 | Disposition: A | Discharge status: Home / Self Care | Payer: Managed Care, Other (non HMO) | Source: Ambulatory Visit | Attending: Orthopedic Surgery | Admitting: Orthopedic Surgery

## 2016-06-28 DIAGNOSIS — M5126 Other intervertebral disc displacement, lumbar region: Secondary | ICD-10-CM

## 2016-06-28 DIAGNOSIS — M5442 Lumbago with sciatica, left side: Secondary | ICD-10-CM

## 2016-06-28 DIAGNOSIS — M51369 Other intervertebral disc degeneration, lumbar region without mention of lumbar back pain or lower extremity pain: Secondary | ICD-10-CM

## 2016-06-28 DIAGNOSIS — M5136 Other intervertebral disc degeneration, lumbar region: Secondary | ICD-10-CM

## 2016-06-28 DIAGNOSIS — M5416 Radiculopathy, lumbar region: Secondary | ICD-10-CM

## 2016-09-23 ENCOUNTER — Other Ambulatory Visit: Payer: Self-pay | Admitting: Neurological Surgery

## 2016-09-23 DIAGNOSIS — D497 Neoplasm of unspecified behavior of endocrine glands and other parts of nervous system: Secondary | ICD-10-CM

## 2016-10-01 ENCOUNTER — Other Ambulatory Visit: Payer: Self-pay

## 2016-10-03 ENCOUNTER — Ambulatory Visit (INDEPENDENT_AMBULATORY_CARE_PROVIDER_SITE_OTHER): Payer: PRIVATE HEALTH INSURANCE | Admitting: Internal Medicine

## 2016-10-03 ENCOUNTER — Encounter: Payer: Self-pay | Admitting: Internal Medicine

## 2016-10-03 VITALS — BP 122/82 | HR 65 | Temp 97.8°F | Ht 69.0 in | Wt 186.0 lb

## 2016-10-03 DIAGNOSIS — Z0000 Encounter for general adult medical examination without abnormal findings: Secondary | ICD-10-CM | POA: Diagnosis not present

## 2016-10-03 DIAGNOSIS — E039 Hypothyroidism, unspecified: Secondary | ICD-10-CM | POA: Diagnosis not present

## 2016-10-03 NOTE — Assessment & Plan Note (Addendum)
Low dose Levels fine within a year---will defer blood work

## 2016-10-03 NOTE — Assessment & Plan Note (Signed)
Healthy but recovering from his back surgery Hopes to get more active again Colon due 4/19 Will defer PSA  Chest rash not helped by TAC Would try fluconazole or selsun if recurs
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
## 2016-10-24 ENCOUNTER — Other Ambulatory Visit: Payer: Self-pay | Admitting: Neurological Surgery

## 2016-10-24 DIAGNOSIS — G8929 Other chronic pain: Secondary | ICD-10-CM

## 2016-10-24 DIAGNOSIS — M545 Low back pain, unspecified: Secondary | ICD-10-CM

## 2016-11-04 ENCOUNTER — Other Ambulatory Visit: Payer: Self-pay | Admitting: Neurological Surgery

## 2016-11-04 ENCOUNTER — Ambulatory Visit
Admission: RE | Admit: 2016-11-04 | Discharge: 2016-11-04 | Disposition: A | Discharge status: Home / Self Care | Payer: PRIVATE HEALTH INSURANCE | Source: Ambulatory Visit | Attending: Neurological Surgery | Admitting: Neurological Surgery

## 2016-11-04 DIAGNOSIS — M545 Low back pain, unspecified: Secondary | ICD-10-CM

## 2016-11-04 DIAGNOSIS — G8929 Other chronic pain: Secondary | ICD-10-CM

## 2016-11-04 MED ORDER — METHYLPREDNISOLONE ACETATE 40 MG/ML INJ SUSP (RADIOLOG
120.0000 mg | Freq: Once | INTRAMUSCULAR | Status: AC
  Administered 2016-11-04: 120 mg via INTRA_ARTICULAR

## 2016-11-04 MED ORDER — IOPAMIDOL (ISOVUE-M 200) INJECTION 41%
1.0000 mL | Freq: Once | INTRAMUSCULAR | Status: AC
  Administered 2016-11-04: 1 mL via INTRA_ARTICULAR

## 2016-11-04 NOTE — Discharge Instructions (Signed)

## 2016-11-14 ENCOUNTER — Encounter: Payer: Managed Care, Other (non HMO) | Admitting: Internal Medicine

## 2016-12-17 ENCOUNTER — Ambulatory Visit
Admission: RE | Admit: 2016-12-17 | Discharge: 2016-12-17 | Disposition: A | Discharge status: Home / Self Care | Payer: PRIVATE HEALTH INSURANCE | Source: Ambulatory Visit | Attending: Neurological Surgery | Admitting: Neurological Surgery

## 2016-12-17 DIAGNOSIS — D497 Neoplasm of unspecified behavior of endocrine glands and other parts of nervous system: Secondary | ICD-10-CM

## 2016-12-17 MED ORDER — GADOBENATE DIMEGLUMINE 529 MG/ML IV SOLN: 17.0000 mL | Freq: Once | INTRAVENOUS | Status: DC | PRN

## 2016-12-29 ENCOUNTER — Telehealth: Payer: Self-pay | Admitting: Internal Medicine

## 2016-12-29 NOTE — Telephone Encounter (Signed)
Copied from [place]. Topic: Quick Communication - See Telephone Encounter >> Dec 29, 2016  2:47 PM Arletha Grippe wrote: CRM for notification. See Telephone encounter for:   12/29/16. Pt would like to have medication called in for cough and sinus issue that he has had for last 5 weeks.  I offreed appt, but he wanted to see if he could get something called in, [facility] [place] Pt cb number is [phone]

## 2016-12-30 ENCOUNTER — Ambulatory Visit: Payer: Self-pay

## 2016-12-30 NOTE — Telephone Encounter (Signed)
Has cough x 4-5 weeks. See triage note.

## 2016-12-30 NOTE — Telephone Encounter (Signed)
  Reason for Disposition . [1] Continuous (nonstop) coughing interferes with work or school AND [2] no improvement using cough treatment per Care Advice  Answer Assessment - Initial Assessment Questions 1. ONSET: "When did the cough begin?"      4-5 weeks ago 2. SEVERITY: "How bad is the cough today?"      :Pretty severe" 3. RESPIRATORY DISTRESS: "Describe your breathing."      No shortness of breath 4. FEVER: "Do you have a fever?" If so, ask: "What is your temperature, how was it measured, and when did it start?"     No 5. SPUTUM: "Describe the color of your sputum" (clear, white, yellow, green)     Yellow 6. HEMOPTYSIS: "Are you coughing up any blood?" If so ask: "How much?" (flecks, streaks, tablespoons, etc.)     No 7. CARDIAC HISTORY: "Do you have any history of heart disease?" (e.g., heart attack, congestive heart failure)      No 8. LUNG HISTORY: "Do you have any history of lung disease?"  (e.g., pulmonary embolus, asthma, emphysema)     No 9. PE RISK FACTORS: "Do you have a history of blood clots?" (or: recent major surgery, recent prolonged travel, bedridden )     No 10. OTHER SYMPTOMS: "Do you have any other symptoms?" (e.g., runny nose, wheezing, chest pain)        Some wheezing, yellow discharge from nose 11. PREGNANCY: "Is there any chance you are pregnant?" "When was your last menstrual period?"       No 12. TRAVEL: "Have you traveled out of the country in the last month?" (e.g., travel history, exposures)       N0  Protocols used: [place] Taking Mucinex. Offered appointment for 12/31/16. Refuses - "I think I'll wait a couple more days - I'm feeling a little better."

## 2017-01-02 ENCOUNTER — Other Ambulatory Visit: Payer: Self-pay | Admitting: Neurological Surgery

## 2017-01-02 DIAGNOSIS — M5489 Other dorsalgia: Secondary | ICD-10-CM

## 2017-01-19 ENCOUNTER — Ambulatory Visit
Admission: RE | Admit: 2017-01-19 | Discharge: 2017-01-19 | Disposition: A | Discharge status: Home / Self Care | Payer: PRIVATE HEALTH INSURANCE | Source: Ambulatory Visit | Attending: Neurological Surgery | Admitting: Neurological Surgery

## 2017-01-19 ENCOUNTER — Other Ambulatory Visit: Payer: Self-pay | Admitting: Neurological Surgery

## 2017-01-19 DIAGNOSIS — M5489 Other dorsalgia: Secondary | ICD-10-CM

## 2017-01-19 DIAGNOSIS — M549 Dorsalgia, unspecified: Secondary | ICD-10-CM

## 2017-01-19 MED ORDER — METHYLPREDNISOLONE ACETATE 40 MG/ML INJ SUSP (RADIOLOG
120.0000 mg | Freq: Once | INTRAMUSCULAR | Status: AC
  Administered 2017-01-19: 120 mg via INTRA_ARTICULAR

## 2017-01-19 MED ORDER — IOPAMIDOL (ISOVUE-M 200) INJECTION 41%
1.0000 mL | Freq: Once | INTRAMUSCULAR | Status: AC
  Administered 2017-01-19: 1 mL via INTRA_ARTICULAR

## 2017-01-30 ENCOUNTER — Other Ambulatory Visit: Payer: Self-pay | Admitting: Internal Medicine

## 2017-01-30 NOTE — Telephone Encounter (Signed)
Approved: okay to refill for a year 

## 2017-01-30 NOTE — Telephone Encounter (Signed)
Last CPE 09/2016. Last lab 12/2015. pls advise

## 2017-03-04 ENCOUNTER — Telehealth: Payer: Self-pay | Admitting: *Deleted

## 2017-03-04 NOTE — Telephone Encounter (Signed)
Copied from [place] [id number]. Topic: General - Other >> Mar 04, 2017  4:20 PM [name], NT wrote: Patient states his back has been hurting for over a year now and he can not get any doctor to help him and relieve this pain. Pt is requesting Dr. Silvio Pate or his nurse to contact him with some advise on what he should do and where he needs to be seen at. I offered to make patient appt. He declined at this time.

## 2017-03-05 NOTE — Telephone Encounter (Signed)
Left a message to call the office.  PEC, please relay Dr Alla German message if he calls back and let us know what he says. Thanks.

## 2017-03-05 NOTE — Telephone Encounter (Signed)
He has seen pain doctors and I see a note from the neurosurgeon from 2 months ago that states his injections didn't help but the MRI looks okay. We probably need to discuss empiric medication for his pain---but I would want to see him in the office and discuss alternatives

## 2017-03-06 NOTE — Telephone Encounter (Signed)
Pt says he called back and spoke to someone here yesterday and they made him an appt with Dr Silvio Pate on 03-17-17.  Nothing was documented.

## 2017-03-17 ENCOUNTER — Encounter: Payer: Self-pay | Admitting: Internal Medicine

## 2017-03-17 ENCOUNTER — Ambulatory Visit (INDEPENDENT_AMBULATORY_CARE_PROVIDER_SITE_OTHER): Payer: PRIVATE HEALTH INSURANCE | Admitting: Internal Medicine

## 2017-03-17 VITALS — BP 128/80 | HR 71 | Temp 98.3°F | Wt 192.0 lb

## 2017-03-17 DIAGNOSIS — G8929 Other chronic pain: Secondary | ICD-10-CM | POA: Diagnosis not present

## 2017-03-17 DIAGNOSIS — M549 Dorsalgia, unspecified: Secondary | ICD-10-CM

## 2017-03-17 DIAGNOSIS — M545 Low back pain: Secondary | ICD-10-CM | POA: Diagnosis not present

## 2017-03-17 NOTE — Patient Instructions (Signed)
Please try acetaminophen arthritis (650mg ) 2-3 times a day to see if that reduces the pain, You can also use ibuprofen 200mg  (2-3 tabs) as needed if the pain is bad. Speak to Dr Aris Lot about getting another back injection.

## 2017-03-17 NOTE — Progress Notes (Signed)
   Subjective:    Patient ID: Willie Griffin, male    DOB: [birth year], 62 y.o.   MRN: [id number]  HPI Here due to ongoing back problems  Around 3 years ago--started with left sciatic pain Injections helped then Then started with pain on the right side Started with Dr Aris Lot at [facility] the tumor and had that removed This didn't resolve the right sided pain Has tried chiropractic and PT--but not helping Has done injections also--- 2nd one did help (lower down), but pain has recurred after 4-6 weeks  No medications for this He can't bend over well---variable Has to be careful picking things up and with transferring positions (sitting to standing, etc)  Issue is the pain Will occasionally take tylenol or motrin--may help some   Review of Systems No leg weakness No loss of bowel or bladder No problems in bed---except occasionally if turning over Has continued exercises for core strengthening    Objective:   Physical Exam  Constitutional: He appears well-developed. No distress.  Musculoskeletal:  Tenderness along upper lumbar spine where surgery was SLR negative bilaterally Back flexion ~75 degrees Hip ROM completely normal  Neurological:  Normal gait and leg strength          Assessment & Plan:

## 2017-03-17 NOTE — Assessment & Plan Note (Signed)
Not really radicular Discussed back strengthening Can consider more injections Discussed analgesics

## 2017-03-24 ENCOUNTER — Other Ambulatory Visit: Payer: Self-pay | Admitting: Neurological Surgery

## 2017-03-24 DIAGNOSIS — M5489 Other dorsalgia: Secondary | ICD-10-CM

## 2017-04-07 ENCOUNTER — Other Ambulatory Visit: Payer: PRIVATE HEALTH INSURANCE

## 2017-04-14 ENCOUNTER — Ambulatory Visit
Admission: RE | Admit: 2017-04-14 | Discharge: 2017-04-14 | Disposition: A | Discharge status: Home / Self Care | Payer: PRIVATE HEALTH INSURANCE | Source: Ambulatory Visit | Attending: Neurological Surgery | Admitting: Neurological Surgery

## 2017-04-14 DIAGNOSIS — M5489 Other dorsalgia: Secondary | ICD-10-CM

## 2017-04-14 MED ORDER — METHYLPREDNISOLONE ACETATE 40 MG/ML INJ SUSP (RADIOLOG
120.0000 mg | Freq: Once | INTRAMUSCULAR | Status: AC
  Administered 2017-04-14: 120 mg via INTRA_ARTICULAR

## 2017-04-14 MED ORDER — IOPAMIDOL (ISOVUE-M 200) INJECTION 41%
1.0000 mL | Freq: Once | INTRAMUSCULAR | Status: AC
  Administered 2017-04-14: 1 mL via INTRA_ARTICULAR

## 2017-04-21 ENCOUNTER — Encounter: Payer: Self-pay | Admitting: Gastroenterology

## 2017-04-24 ENCOUNTER — Other Ambulatory Visit: Payer: Self-pay | Admitting: Neurological Surgery

## 2017-04-24 DIAGNOSIS — D334 Benign neoplasm of spinal cord: Secondary | ICD-10-CM

## 2017-04-28 ENCOUNTER — Encounter: Payer: Self-pay | Admitting: Gastroenterology

## 2017-04-28 ENCOUNTER — Other Ambulatory Visit: Payer: Self-pay | Admitting: Neurological Surgery

## 2017-04-28 DIAGNOSIS — D334 Benign neoplasm of spinal cord: Secondary | ICD-10-CM

## 2017-05-20 ENCOUNTER — Other Ambulatory Visit: Payer: Self-pay | Admitting: Internal Medicine

## 2017-06-05 ENCOUNTER — Other Ambulatory Visit: Payer: Self-pay | Admitting: Neurological Surgery

## 2017-06-05 DIAGNOSIS — M5489 Other dorsalgia: Secondary | ICD-10-CM

## 2017-06-17 ENCOUNTER — Other Ambulatory Visit: Payer: Self-pay | Admitting: Neurological Surgery

## 2017-06-17 ENCOUNTER — Ambulatory Visit
Admission: RE | Admit: 2017-06-17 | Discharge: 2017-06-17 | Disposition: A | Discharge status: Home / Self Care | Payer: PRIVATE HEALTH INSURANCE | Source: Ambulatory Visit | Attending: Neurological Surgery | Admitting: Neurological Surgery

## 2017-06-17 DIAGNOSIS — M5489 Other dorsalgia: Secondary | ICD-10-CM

## 2017-06-23 ENCOUNTER — Ambulatory Visit (AMBULATORY_SURGERY_CENTER): Payer: Self-pay | Admitting: *Deleted

## 2017-06-23 ENCOUNTER — Other Ambulatory Visit: Payer: Self-pay

## 2017-06-23 VITALS — Ht 68.0 in | Wt 183.8 lb

## 2017-06-23 DIAGNOSIS — Z1211 Encounter for screening for malignant neoplasm of colon: Secondary | ICD-10-CM

## 2017-06-23 MED ORDER — PEG 3350-KCL-NA BICARB-NACL 420 G PO SOLR: 4000.0000 mL | Freq: Once | ORAL | 0 refills | Status: AC

## 2017-06-23 NOTE — Progress Notes (Signed)
No egg or soy allergy known to patient  No issues with past sedation with any surgeries  or procedures, no intubation problems  No diet pills per patient No home 02 use per patient  No blood thinners per patient  Pt denies issues with constipation  No A fib or A flutter  EMMI video sent to pt's e mail pt declined   

## 2017-07-01 ENCOUNTER — Encounter: Payer: Self-pay | Admitting: Gastroenterology

## 2017-07-06 ENCOUNTER — Encounter: Payer: Self-pay | Admitting: Gastroenterology

## 2017-07-06 ENCOUNTER — Ambulatory Visit (AMBULATORY_SURGERY_CENTER): Payer: PRIVATE HEALTH INSURANCE | Admitting: Gastroenterology

## 2017-07-06 VITALS — BP 112/66 | HR 82 | Temp 99.3°F | Resp 13 | Ht 68.0 in | Wt 183.0 lb

## 2017-07-06 DIAGNOSIS — K5730 Diverticulosis of large intestine without perforation or abscess without bleeding: Secondary | ICD-10-CM

## 2017-07-06 DIAGNOSIS — K649 Unspecified hemorrhoids: Secondary | ICD-10-CM | POA: Diagnosis not present

## 2017-07-06 DIAGNOSIS — Z1211 Encounter for screening for malignant neoplasm of colon: Secondary | ICD-10-CM | POA: Diagnosis not present

## 2017-07-06 MED ORDER — SODIUM CHLORIDE 0.9 % IV SOLN: 500.0000 mL | Freq: Once | INTRAVENOUS | Status: DC

## 2017-07-06 NOTE — Op Note (Signed)
[facility] Patient Name: Willie Griffin Procedure Date: 07/06/2017 8:09 AM MRN: [id number] Endoscopist: Milus Banister , MD Age: 62 Referring MD:  Date of Birth: 1955/04/19 Gender: Male Account #: [id number] Procedure:                Colonoscopy Indications:              Screening for colorectal malignant neoplasm Medicines:                Monitored Anesthesia Care Procedure:                Pre-Anesthesia Assessment:                           - Prior to the procedure, a History and Physical                            was performed, and patient medications and                            allergies were reviewed. The patient's tolerance of                            previous anesthesia was also reviewed. The risks                            and benefits of the procedure and the sedation                            options and risks were discussed with the patient.                            All questions were answered, and informed consent                            was obtained. Prior Anticoagulants: The patient has                            taken no previous anticoagulant or antiplatelet                            agents. ASA Grade Assessment: II - A patient with                            mild systemic disease. After reviewing the risks                            and benefits, the patient was deemed in                            satisfactory condition to undergo the procedure.                           After obtaining informed consent, the colonoscope  was passed under direct vision. Throughout the                            procedure, the patient's blood pressure, pulse, and                            oxygen saturations were monitored continuously. The                            Colonoscope was introduced through the anus and                            advanced to the the cecum, identified by                            appendiceal orifice and  ileocecal valve. The                            colonoscopy was performed without difficulty. The                            patient tolerated the procedure well. The quality                            of the bowel preparation was good. The ileocecal                            valve, appendiceal orifice, and rectum were                            photographed. Scope In: 8:15:23 AM Scope Out: 8:28:29 AM Scope Withdrawal Time: 0 hours 10 minutes 14 seconds  Total Procedure Duration: 0 hours 13 minutes 6 seconds  Findings:                 Multiple small and large-mouthed diverticula were                            found in the left colon.                           Internal hemorrhoids were found. The hemorrhoids                            were small.                           The exam was otherwise without abnormality on                            direct and retroflexion views. Complications:            No immediate complications. Estimated blood loss:                            None. Estimated Blood Loss:  Estimated blood loss: none. Impression:               - Diverticulosis in the left colon.                           - Small internal hemorrhoids.                           - The examination was otherwise normal on direct                            and retroflexion views.                           - No specimens collected. Recommendation:           - Patient has a contact number available for                            emergencies. The signs and symptoms of potential                            delayed complications were discussed with the                            patient. Return to normal activities tomorrow.                            Written discharge instructions were provided to the                            patient.                           - Resume previous diet.                           - Continue present medications.                           - Repeat colonoscopy in 10  years for screening. Milus Banister, MD 07/06/2017 8:30:24 AM This report has been signed electronically.

## 2017-07-06 NOTE — Patient Instructions (Signed)
*  Handouts given on diverticulosis and hemorrhoids.  YOU HAD AN ENDOSCOPIC PROCEDURE TODAY AT THE [facility]:   Refer to the procedure report that was given to you for any specific questions about what was found during the examination.  If the procedure report does not answer your questions, please call your gastroenterologist to clarify.  If you requested that your care partner not be given the details of your procedure findings, then the procedure report has been included in a sealed envelope for you to review at your convenience later.  YOU SHOULD EXPECT: Some feelings of bloating in the abdomen. Passage of more gas than usual.  Walking can help get rid of the air that was put into your GI tract during the procedure and reduce the bloating. If you had a lower endoscopy (such as a colonoscopy or flexible sigmoidoscopy) you may notice spotting of blood in your stool or on the toilet paper. If you underwent a bowel prep for your procedure, you may not have a normal bowel movement for a few days.  Please Note:  You might notice some irritation and congestion in your nose or some drainage.  This is from the oxygen used during your procedure.  There is no need for concern and it should clear up in a day or so.  SYMPTOMS TO REPORT IMMEDIATELY:   Following lower endoscopy (colonoscopy or flexible sigmoidoscopy):  Excessive amounts of blood in the stool  Significant tenderness or worsening of abdominal pains  Swelling of the abdomen that is new, acute  Fever of 100F or higher   For urgent or emergent issues, a gastroenterologist can be reached at any hour by calling [phone].   DIET:  We do recommend a small meal at first, but then you may proceed to your regular diet.  Drink plenty of fluids but you should avoid alcoholic beverages for 24 hours.  ACTIVITY:  You should plan to take it easy for the rest of today and you should NOT DRIVE or use heavy machinery until tomorrow  (because of the sedation medicines used during the test).    FOLLOW UP: Our staff will call the number listed on your records the next business day following your procedure to check on you and address any questions or concerns that you may have regarding the information given to you following your procedure. If we do not reach you, we will leave a message.  However, if you are feeling well and you are not experiencing any problems, there is no need to return our call.  We will assume that you have returned to your regular daily activities without incident.  If any biopsies were taken you will be contacted by phone or by letter within the next 1-3 weeks.  Please call us at [phone] if you have not heard about the biopsies in 3 weeks.    SIGNATURES/CONFIDENTIALITY: You and/or your care partner have signed paperwork which will be entered into your electronic medical record.  These signatures attest to the fact that that the information above on your After Visit Summary has been reviewed and is understood.  Full responsibility of the confidentiality of this discharge information lies with you and/or your care-partner. 

## 2017-07-06 NOTE — Progress Notes (Signed)
Report given to PACU, vss 

## 2017-07-06 NOTE — Progress Notes (Signed)
Pt's states no medical or surgical changes since previsit or office visit. 

## 2017-07-07 ENCOUNTER — Telehealth: Payer: Self-pay | Admitting: *Deleted

## 2017-07-07 NOTE — Telephone Encounter (Signed)
Santiago Glad, Thanks I agree. Seems unlikely a procedure related complication.   Patty, Can you call him later this afternoon to see how he is doing.  THanks

## 2017-07-07 NOTE — Telephone Encounter (Signed)
Great, thanks

## 2017-07-07 NOTE — Telephone Encounter (Signed)
  Follow up Call-  Call back number 07/06/2017  Post procedure Call Back phone  # [phone]  Permission to leave phone message Yes  Some recent data might be hidden     Patient questions:  Do you have a fever, pain , or abdominal swelling? No. Pain Score  0 *  Have you tolerated food without any problems? Yes.    Have you been able to return to your normal activities? Yes.    Do you have any questions about your discharge instructions: Diet   No. Medications  No. Follow up visit  No.  Do you have questions or concerns about your Care? No.  Actions: * If pain score is 4 or above: No action needed, pain <4. Patient with "head cold". Patient running fever last pm, yet less than 100. Patient with productive cough, patient unaware of color of sputum. No chest pain or dyspnea. Doesn't feel well, yet at work. Discussed with patient that 02 used can give patients a runny stuffy nose.Encouraged plenty of fluids and to monitor temp. Note sent to md for any other suggestions

## 2017-07-07 NOTE — Telephone Encounter (Signed)
The pt states he is feeling better and will follow up with PCP is he does not continue to improve.

## 2017-07-13 ENCOUNTER — Telehealth: Payer: Self-pay | Admitting: Gastroenterology

## 2017-07-13 NOTE — Telephone Encounter (Signed)
Patient reports diarrhea since the colonoscopy last week. He is advised that he should add a fiber supplement and that it may help firm up his stool.  He will call back for any additional questions or concerns.

## 2017-07-13 NOTE — Telephone Encounter (Signed)
Patient states he had a procedure a week ago and now everything "runs right through him." Patient wanting some advice.

## 2017-07-15 ENCOUNTER — Telehealth: Payer: Self-pay | Admitting: Gastroenterology

## 2017-07-15 NOTE — Telephone Encounter (Signed)
Patient calling to report he is still having liquid stools. He has had them since his procedure on 07/06/17. He added fiber to his diet but it has not helped. Reports 7-8 liquid stools today. He is also getting up during the night with diarrhea. Reports weight loss of 10 lbs since procedure. Please, advise.

## 2017-07-16 ENCOUNTER — Other Ambulatory Visit: Payer: Self-pay

## 2017-07-16 ENCOUNTER — Other Ambulatory Visit (INDEPENDENT_AMBULATORY_CARE_PROVIDER_SITE_OTHER): Payer: PRIVATE HEALTH INSURANCE

## 2017-07-16 DIAGNOSIS — R197 Diarrhea, unspecified: Secondary | ICD-10-CM

## 2017-07-16 LAB — BASIC METABOLIC PANEL
BUN: 15 mg/dL (ref 6–23)
CO2: 27 mEq/L (ref 19–32)
Calcium: 8.9 mg/dL (ref 8.4–10.5)
Chloride: 104 mEq/L (ref 96–112)
Creatinine, Ser: 1.07 mg/dL (ref 0.40–1.50)
GFR: 74.83 mL/min (ref >60.00)
Glucose, Bld: 98 mg/dL (ref 70–99)
Potassium: 3.3 mEq/L — ABNORMAL LOW (ref 3.5–5.1)
Sodium: 138 mEq/L (ref 135–145)

## 2017-07-16 LAB — CBC WITH DIFFERENTIAL/PLATELET
Basophils Absolute: 0.1 10*3/uL (ref 0.0–0.1)
Basophils Relative: 0.6 % (ref 0.0–3.0)
Eosinophils Absolute: 0.3 10*3/uL (ref 0.0–0.7)
Eosinophils Relative: 2.3 % (ref 0.0–5.0)
HCT: 42.4 % (ref 39.0–52.0)
Hemoglobin: 14.8 g/dL (ref 13.0–17.0)
Lymphocytes Relative: 15.3 % (ref 12.0–46.0)
Lymphs Abs: 1.8 10*3/uL (ref 0.7–4.0)
MCHC: 35 g/dL (ref 30.0–36.0)
MCV: 88.9 fl (ref 78.0–100.0)
Monocytes Absolute: 1.4 10*3/uL — ABNORMAL HIGH (ref 0.1–1.0)
Monocytes Relative: 12.4 % — ABNORMAL HIGH (ref 3.0–12.0)
Neutro Abs: 8.1 10*3/uL — ABNORMAL HIGH (ref 1.4–7.7)
Neutrophils Relative %: 69.4 % (ref 43.0–77.0)
Platelets: 306 10*3/uL (ref 150.0–400.0)
RBC: 4.77 Mil/uL (ref 4.22–5.81)
RDW: 12.3 % (ref 11.5–15.5)
WBC: 11.6 10*3/uL — ABNORMAL HIGH (ref 4.0–10.5)

## 2017-07-16 NOTE — Telephone Encounter (Signed)
Spoke with pt and he is aware, orders entered in epic.

## 2017-07-16 NOTE — Telephone Encounter (Signed)
He needs labs (stool testing for c. Diff by pcr and by toxin assay; also bmet and cbc). Please advise him to take imodium 1-2 pills twice daily for now.  thanks

## 2017-07-16 NOTE — Addendum Note (Signed)
Addended by: Trenda Moots on: 3/58/2518 09:52 AM   Modules accepted: Orders

## 2017-07-17 ENCOUNTER — Encounter: Payer: Self-pay | Admitting: Family Medicine

## 2017-07-17 ENCOUNTER — Other Ambulatory Visit: Payer: PRIVATE HEALTH INSURANCE

## 2017-07-17 ENCOUNTER — Encounter: Payer: Self-pay | Admitting: *Deleted

## 2017-07-17 ENCOUNTER — Ambulatory Visit (INDEPENDENT_AMBULATORY_CARE_PROVIDER_SITE_OTHER): Payer: PRIVATE HEALTH INSURANCE | Admitting: Family Medicine

## 2017-07-17 VITALS — BP 90/60 | HR 80 | Temp 99.0°F | Ht 68.0 in | Wt 178.5 lb

## 2017-07-17 DIAGNOSIS — J208 Acute bronchitis due to other specified organisms: Secondary | ICD-10-CM

## 2017-07-17 DIAGNOSIS — R197 Diarrhea, unspecified: Secondary | ICD-10-CM

## 2017-07-17 MED ORDER — AZITHROMYCIN 250 MG PO TABS: ORAL_TABLET | ORAL | 0 refills | Status: DC

## 2017-07-17 NOTE — Progress Notes (Signed)
Dr. Frederico Hamman T. Kamyra Schroeck, MD, [facility] [place], [address] Phone: [phone] Fax: [phone]  07/17/2017  Patient: Willie Griffin, MRN: [id number], DOB: [birth year], 62 y.o.  Primary Physician:  [name], MD   Chief Complaint  Patient presents with  . Hoarse  . Cough   Subjective:   Willie Griffin is a 62 y.o. very pleasant male patient who presents with the following:  Coughing a lot and has a dry cough. Coughing since colonoscopy 2 mondays ago. Having some earache, sore throat, congestion.   He is generally not feeling all that well, he has a lot of congestion, he has some nasal drainage and pressure in his head.  He has had some earache and ear pressure.  He has progressively developed a cough as well.  Past Medical History, Surgical History, Social History, Family History, Problem List, Medications, and Allergies have been reviewed and updated if relevant.  Patient Active Problem List   Diagnosis Date Noted  . Chronic back pain 03/17/2017  . Hypothyroidism   . Lumbar radiculopathy 04/24/2015  . Rosacea 10/21/2013  . Routine general medical examination at a health care facility 08/09/2010  . ALLERGIC RHINITIS 03/17/2007  . Peyronie's disease 02/19/2007    Past Medical History:  Diagnosis Date  . Allergy    allergic rhinitis  . GERD (gastroesophageal reflux disease)   . Hypothyroidism   . Peyronie disease     Past Surgical History:  Procedure Laterality Date  . COLONOSCOPY    . INGUINAL HERNIA REPAIR  9/05  . TOENAIL EXCISION     partial  . TONSILLECTOMY  1972  . 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 Marital status: Married    Spouse name: Not on file  . Number of children: 2  . Years of education: Not on file  . Highest education level: Not on file  Occupational History  . 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 Financial resource strain: Not on file  . Food insecurity:    Worry: Not on file    Inability: Not on file  . Transportation needs:    Medical: Not on file    Non-medical: Not on file  Tobacco Use  . 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 Smokeless tobacco: Never Used  Substance and Sexual Activity  . Alcohol use: No  . Drug use: No  . Sexual activity: Not on file  Lifestyle  . Physical activity:    Days per week: Not on file    Minutes per session: Not on file  . Stress: Not on file  Relationships  . Social connections:    Talks on phone: Not on file    Gets together: Not on file    Attends religious service: Not on file    Active member of club or organization: Not on file    Attends meetings of clubs or organizations: Not on file    Relationship status: Not on file  . Intimate partner violence:    Fear of current or ex partner: Not on file    Emotionally abused: Not on file    Physically abused: Not on file    Forced sexual activity: Not on file  Other Topics Concern  . Not on file  Social History Narrative  . Not on file  Family History  Problem Relation Age of Onset  . Heart attack Father   . Dementia Father        Lewy body dementia  . Arthritis Mother   . Coronary artery disease Paternal Grandfather   . Coronary artery disease Maternal Grandfather   . Suicidality Paternal Grandmother   . Breast cancer Paternal Grandmother   . Stroke Maternal Grandmother   . Hodgkin's lymphoma Sister   . Cancer Sister   . Other Sister        precancerous polyps  . Cancer Paternal Aunt        jaw ca  . Celiac disease Paternal Uncle   . Other Paternal Uncle        mesothelioma  . Breast cancer Other   . Diabetes Neg Hx   . Hypertension Neg Hx   . Colon polyps Neg Hx   . Colon cancer Neg Hx     No Known Allergies  Medication list reviewed and updated in full in [place].  ROS: GEN: Acute illness details above GI: Tolerating PO  intake GU: maintaining adequate hydration and urination Pulm: No SOB Interactive and getting along well at home.  Otherwise, ROS is as per the HPI.  Objective:   BP 90/60   Pulse 80   Temp 99 F (37.2 C) (Oral)   Ht 5\' 8"  (1.727 m)   Wt 178 lb 8 oz (81 kg)   SpO2 95%   BMI 27.14 kg/m    GEN: A and O x 3. WDWN. NAD.    ENT: Nose clear, ext NML.  No LAD.  No JVD.  TM's clear. Oropharynx clear.  PULM: Normal WOB, no distress. No crackles, No wheezes, Bilateral rhonchi in upper lung fields CV: RRR, no M/G/R, No rubs, No JVD.   EXT: warm and well-perfused, No c/c/e. PSYCH: Pleasant and conversant.    Laboratory and Imaging Data:  Assessment and Plan:   Acute bronchitis due to other specified organisms  Given pulmonary exam, I am going to treat the patient with antibiotics.  Does have rhonchi ongoing.  Viral bronchitis and atypical pneumonia would be the highest in the differential.  Follow-up: No follow-ups on file.  Meds ordered this encounter  Medications  . azithromycin (ZITHROMAX) 250 MG tablet    Sig: 2 tabs po on day 1, then 1 tab po for 4 days    Dispense:  6 tablet    Refill:  0   Signed,  Kytzia Gienger T. Jaris Kohles, MD   Allergies as of 07/17/2017   No Known Allergies     Medication List        Accurate as of 07/17/17 11:59 PM. Always use your most recent med list.          acetaminophen 650 MG CR tablet Commonly known as:  TYLENOL Take 650 mg by mouth every 8 (eight) hours as needed for pain.   azithromycin 250 MG tablet Commonly known as:  ZITHROMAX 2 tabs po on day 1, then 1 tab po for 4 days   benzonatate 100 MG capsule Commonly known as:  TESSALON Take 100 mg by mouth 3 (three) times daily as needed for cough.   EPINEPHrine 0.3 mg/0.3 mL Soaj injection Commonly known as:  EPI-PEN Inject 0.3 mLs (0.3 mg total) into the muscle once as needed.   levothyroxine 25 MCG tablet Commonly known as:  SYNTHROID, LEVOTHROID TAKE 1 TABLET (25 MCG TOTAL)  BY MOUTH DAILY BEFORE BREAKFAST.   NOREL AD  PO Take 1 tablet by mouth 4 (four) times daily as needed.   PROCTOSOL HC 2.5 % rectal cream Generic drug:  hydrocortisone APPLY RECTALLY 2 TIMES A DAY   SOOLANTRA 1 % Crea Generic drug:  Ivermectin Apply topically daily. to face   triamcinolone cream 0.1 % Commonly known as:  KENALOG Uses as directed

## 2017-07-20 LAB — CLOSTRIDIUM DIFFICILE EIA: C difficile Toxins A+B, EIA: NEGATIVE

## 2017-07-20 LAB — CLOSTRIDIUM DIFFICILE BY PCR: Toxigenic C. Difficile by PCR: NEGATIVE

## 2017-10-20 ENCOUNTER — Ambulatory Visit (INDEPENDENT_AMBULATORY_CARE_PROVIDER_SITE_OTHER): Payer: PRIVATE HEALTH INSURANCE | Admitting: Internal Medicine

## 2017-10-20 ENCOUNTER — Encounter: Payer: Self-pay | Admitting: Internal Medicine

## 2017-10-20 VITALS — BP 110/82 | HR 60 | Temp 97.9°F | Ht 67.75 in | Wt 178.0 lb

## 2017-10-20 DIAGNOSIS — Z125 Encounter for screening for malignant neoplasm of prostate: Secondary | ICD-10-CM

## 2017-10-20 DIAGNOSIS — E039 Hypothyroidism, unspecified: Secondary | ICD-10-CM

## 2017-10-20 DIAGNOSIS — K219 Gastro-esophageal reflux disease without esophagitis: Secondary | ICD-10-CM

## 2017-10-20 DIAGNOSIS — Z23 Encounter for immunization: Secondary | ICD-10-CM | POA: Diagnosis not present

## 2017-10-20 DIAGNOSIS — M5416 Radiculopathy, lumbar region: Secondary | ICD-10-CM | POA: Diagnosis not present

## 2017-10-20 DIAGNOSIS — Z0000 Encounter for general adult medical examination without abnormal findings: Secondary | ICD-10-CM | POA: Diagnosis not present

## 2017-10-20 LAB — CBC
HCT: 45.8 % (ref 39.0–52.0)
Hemoglobin: 15.8 g/dL (ref 13.0–17.0)
MCHC: 34.6 g/dL (ref 30.0–36.0)
MCV: 88.7 fl (ref 78.0–100.0)
Platelets: 299 10*3/uL (ref 150.0–400.0)
RBC: 5.17 Mil/uL (ref 4.22–5.81)
RDW: 12.6 % (ref 11.5–15.5)
WBC: 6 10*3/uL (ref 4.0–10.5)

## 2017-10-20 LAB — COMPREHENSIVE METABOLIC PANEL
ALT: 20 U/L (ref 0–53)
AST: 18 U/L (ref 0–37)
Albumin: 4.3 g/dL (ref 3.5–5.2)
Alkaline Phosphatase: 52 U/L (ref 39–117)
BUN: 23 mg/dL (ref 6–23)
CO2: 30 mEq/L (ref 19–32)
Calcium: 9.4 mg/dL (ref 8.4–10.5)
Chloride: 105 mEq/L (ref 96–112)
Creatinine, Ser: 1.14 mg/dL (ref 0.40–1.50)
GFR: 69.49 mL/min (ref >60.00)
Glucose, Bld: 104 mg/dL — ABNORMAL HIGH (ref 70–99)
Potassium: 4.7 mEq/L (ref 3.5–5.1)
Sodium: 140 mEq/L (ref 135–145)
Total Bilirubin: 0.6 mg/dL (ref 0.2–1.2)
Total Protein: 6.6 g/dL (ref 6.0–8.3)

## 2017-10-20 LAB — T4, FREE: Free T4: 0.7 ng/dL (ref 0.60–1.60)

## 2017-10-20 LAB — PSA: PSA: 2.25 ng/mL (ref 0.10–4.00)

## 2017-10-20 LAB — TSH: TSH: 0.94 u[IU]/mL (ref 0.35–4.50)

## 2017-10-20 MED ORDER — HYDROCORTISONE 2.5 % RE CREA: TOPICAL_CREAM | RECTAL | 1 refills | Status: DC

## 2017-10-20 NOTE — Assessment & Plan Note (Signed)
Chronic problems Discussed trying regular tylenol or considering CBD trial consistently Needs to work on fitness

## 2017-10-20 NOTE — Addendum Note (Signed)
Addended by: Pilar Grammes on: 10/20/2017 09:47 AM   Modules accepted: Orders

## 2017-10-20 NOTE — Assessment & Plan Note (Signed)
Seems euthyroid ?Will check labs ?

## 2017-10-20 NOTE — Assessment & Plan Note (Signed)
Discussed trying PPI trial

## 2017-10-20 NOTE — Assessment & Plan Note (Addendum)
Healthy but needs to work on fitness Will check PSA Just had normal colon Flu vaccine at work Will consider shingrix when available Will update Td

## 2017-10-20 NOTE — Patient Instructions (Signed)
Try over the counter omeprazole 20mg  daily on an empty stomach for 2-4 weeks (to see if it helps the swallowing and throat symptoms). If it does help, you can stop and use intermittently, or reduce it to every other day.

## 2017-10-20 NOTE — Progress Notes (Signed)
Subjective:    Patient ID: Willie Griffin, male    DOB: 09-Jul-1955, 62 y.o.   MRN: [id number]  HPI Here for physical  He is noticing a lot of aches and pains It is "frustrating" Has to wear wrist braces at night---pain in hands, right shoulder  Chronic back pain--got injections which helped a little on the left. Now with different pain on right side that defies clear explanation Uses tylenol arthritis at times--not regular Has tried CBD--but inconsistent (discussed need to be consistent to judge) Not really exercising--since the back surgery  Current Outpatient Medications on File Prior to Visit  Medication Sig Dispense Refill  . acetaminophen (TYLENOL) 650 MG CR tablet Take 650 mg by mouth every 8 (eight) hours as needed for pain.    Marland Kitchen EPINEPHrine 0.3 mg/0.3 mL IJ SOAJ injection Inject 0.3 mLs (0.3 mg total) into the muscle once as needed. 2 Device 1  . levothyroxine (SYNTHROID, LEVOTHROID) 25 MCG tablet TAKE 1 TABLET (25 MCG TOTAL) BY MOUTH DAILY BEFORE BREAKFAST. 30 tablet 11  . PROCTOSOL HC 2.5 % rectal cream APPLY RECTALLY 2 TIMES A DAY 28.35 g 0  . SOOLANTRA 1 % CREA Apply topically daily. to face  5  . triamcinolone cream (KENALOG) 0.1 % Uses as directed 30 g 2   No current facility-administered medications on file prior to visit.     No Known Allergies  Past Medical History:  Diagnosis Date  . Allergy    allergic rhinitis  . GERD (gastroesophageal reflux disease)   . Hypothyroidism   . Peyronie disease     Past Surgical History:  Procedure Laterality Date  . COLONOSCOPY    . INGUINAL HERNIA REPAIR  9/05  . TOENAIL EXCISION     partial  . TONSILLECTOMY  1972  . 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 Heart attack Father   . Dementia Father        Lewy body dementia  . Arthritis Mother   . Coronary artery disease Paternal Grandfather   . Coronary artery disease Maternal Grandfather   . Suicidality Paternal  Grandmother   . Breast cancer Paternal Grandmother   . Stroke Maternal Grandmother   . Hodgkin's lymphoma Sister   . Cancer Sister   . Other Sister        precancerous polyps  . Cancer Paternal Aunt        jaw ca  . Celiac disease Paternal Uncle   . Other Paternal Uncle        mesothelioma  . Breast cancer Other   . Diabetes Neg Hx   . Hypertension Neg Hx   . Colon polyps Neg Hx   . Colon cancer Neg Hx     Social History   Socioeconomic History  . Marital status: Married    Spouse name: Not on file  . Number of children: 2  . Years of education: Not on file  . Highest education level: Not on file  Occupational History  . 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  . Financial resource strain: Not on file  . Food insecurity:    Worry: Not on file    Inability: Not on file  . Transportation needs:    Medical: Not on file    Non-medical: Not on file  Tobacco Use  . 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 Smokeless  tobacco: Never Used  Substance and Sexual Activity  . Alcohol use: No  . Drug use: No  . Sexual activity: Not on file  Lifestyle  . Physical activity:    Days per week: Not on file    Minutes per session: Not on file  . Stress: Not on file  Relationships  . Social connections:    Talks on phone: Not on file    Gets together: Not on file    Attends religious service: Not on file    Active member of club or organization: Not on file    Attends meetings of clubs or organizations: Not on file    Relationship status: Not on file  . Intimate partner violence:    Fear of current or ex partner: Not on file    Emotionally abused: Not on file    Physically abused: Not on file    Forced sexual activity: Not on file  Other Topics Concern  . Not on file  Social History Narrative  . Not on file   Review of Systems  Constitutional: Negative for unexpected weight change.       Energy levels are down some Wears seat belt    HENT: Positive for tinnitus. Negative for dental problem, hearing loss and trouble swallowing.        Keeps up with dentist  Eyes: Negative for visual disturbance.       No diplopia or unilateral vision loss Far vision better--reading worse  Respiratory:       Some nagging cough/throat symptoms  Cardiovascular: Negative for chest pain and palpitations.       Mild left leg swelling from varicosities Support hose in cooler weather  Gastrointestinal: Negative for blood in stool and constipation.       Occ heartburn--drinks milk and it helps. Some feeling of backing up with breakfast. Needs to drink something and then better  Endocrine: Negative for polydipsia and polyuria.  Genitourinary: Negative for difficulty urinating and urgency.       Occasional ED--not really an issue  Musculoskeletal: Positive for arthralgias, back pain and myalgias. Negative for joint swelling.       Right shoulder especially limiting (in addition to back)  Skin: Negative for rash.  Allergic/Immunologic: Positive for environmental allergies. Negative for immunocompromised state.       Not as bad this year---uses cetirizine  Neurological: Negative for dizziness, syncope, light-headedness and headaches.  Hematological: Negative for adenopathy. Bruises/bleeds easily.  Psychiatric/Behavioral: Negative for dysphoric mood and sleep disturbance. The patient is not nervous/anxious.        Objective:   Physical Exam  Constitutional: He is oriented to person, place, and time. He appears well-developed. No distress.  HENT:  Head: Normocephalic and atraumatic.  Right Ear: External ear normal.  Left Ear: External ear normal.  Mouth/Throat: Oropharynx is clear and moist. No oropharyngeal exudate.  Eyes: Pupils are equal, round, and reactive to light. Conjunctivae are normal.  Neck: No thyromegaly present.  Cardiovascular: Normal rate, regular rhythm, normal heart sounds and intact distal pulses. Exam reveals no gallop.   No murmur heard. Respiratory: Effort normal and breath sounds normal. No respiratory distress. He has no wheezes. He has no rales.  GI: Soft. There is no tenderness.  Musculoskeletal: He exhibits no edema or tenderness.  Right low back pain--careful sitting up Normal ROM in right hip Pain along bicipital tendon in right shoulder Varicosities in left calf  Lymphadenopathy:    He has no cervical adenopathy.  Neurological:  He is alert and oriented to person, place, and time.  Skin: No rash noted. No erythema.  Psychiatric: He has a normal mood and affect. His behavior is normal.           Assessment & Plan:

## 2017-12-11 ENCOUNTER — Other Ambulatory Visit: Payer: Self-pay | Admitting: Neurological Surgery

## 2017-12-11 DIAGNOSIS — D334 Benign neoplasm of spinal cord: Secondary | ICD-10-CM

## 2017-12-18 ENCOUNTER — Ambulatory Visit
Admission: RE | Admit: 2017-12-18 | Discharge: 2017-12-18 | Disposition: A | Discharge status: Home / Self Care | Payer: PRIVATE HEALTH INSURANCE | Source: Ambulatory Visit | Attending: Neurological Surgery | Admitting: Neurological Surgery

## 2017-12-18 DIAGNOSIS — D334 Benign neoplasm of spinal cord: Secondary | ICD-10-CM

## 2017-12-18 MED ORDER — GADOBENATE DIMEGLUMINE 529 MG/ML IV SOLN
17.0000 mL | Freq: Once | INTRAVENOUS | Status: AC | PRN
  Administered 2017-12-18: 17 mL via INTRAVENOUS

## 2018-01-14 DIAGNOSIS — M47817 Spondylosis without myelopathy or radiculopathy, lumbosacral region: Secondary | ICD-10-CM | POA: Insufficient documentation

## 2018-02-27 ENCOUNTER — Other Ambulatory Visit: Payer: Self-pay | Admitting: Internal Medicine

## 2018-08-05 ENCOUNTER — Encounter: Payer: Self-pay | Admitting: Internal Medicine

## 2018-10-27 ENCOUNTER — Encounter: Payer: Self-pay | Admitting: Internal Medicine

## 2018-10-27 ENCOUNTER — Ambulatory Visit (INDEPENDENT_AMBULATORY_CARE_PROVIDER_SITE_OTHER): Payer: PRIVATE HEALTH INSURANCE | Admitting: Internal Medicine

## 2018-10-27 ENCOUNTER — Telehealth: Payer: Self-pay

## 2018-10-27 ENCOUNTER — Other Ambulatory Visit: Payer: Self-pay

## 2018-10-27 VITALS — BP 130/80 | HR 60 | Temp 98.7°F | Ht 67.5 in | Wt 179.0 lb

## 2018-10-27 DIAGNOSIS — Z0000 Encounter for general adult medical examination without abnormal findings: Secondary | ICD-10-CM

## 2018-10-27 DIAGNOSIS — E039 Hypothyroidism, unspecified: Secondary | ICD-10-CM | POA: Diagnosis not present

## 2018-10-27 DIAGNOSIS — Z23 Encounter for immunization: Secondary | ICD-10-CM | POA: Diagnosis not present

## 2018-10-27 DIAGNOSIS — M5416 Radiculopathy, lumbar region: Secondary | ICD-10-CM

## 2018-10-27 LAB — LIPID PANEL
Cholesterol: 183 mg/dL (ref 0–200)
HDL: 40.6 mg/dL (ref >39.00)
LDL Cholesterol: 113 mg/dL — ABNORMAL HIGH (ref 0–99)
NonHDL: 142.75
Total CHOL/HDL Ratio: 5
Triglycerides: 151 mg/dL — ABNORMAL HIGH (ref 0.0–149.0)
VLDL: 30.2 mg/dL (ref 0.0–40.0)

## 2018-10-27 LAB — TSH: TSH: 1.15 u[IU]/mL (ref 0.35–4.50)

## 2018-10-27 LAB — COMPREHENSIVE METABOLIC PANEL
ALT: 20 U/L (ref 0–53)
AST: 19 U/L (ref 0–37)
Albumin: 4.4 g/dL (ref 3.5–5.2)
Alkaline Phosphatase: 59 U/L (ref 39–117)
BUN: 22 mg/dL (ref 6–23)
CO2: 27 mEq/L (ref 19–32)
Calcium: 9.6 mg/dL (ref 8.4–10.5)
Chloride: 104 mEq/L (ref 96–112)
Creatinine, Ser: 1.1 mg/dL (ref 0.40–1.50)
GFR: 67.9 mL/min (ref >60.00)
Glucose, Bld: 104 mg/dL — ABNORMAL HIGH (ref 70–99)
Potassium: 4.5 mEq/L (ref 3.5–5.1)
Sodium: 140 mEq/L (ref 135–145)
Total Bilirubin: 0.7 mg/dL (ref 0.2–1.2)
Total Protein: 6.6 g/dL (ref 6.0–8.3)

## 2018-10-27 LAB — CBC
HCT: 44.7 % (ref 39.0–52.0)
Hemoglobin: 15.5 g/dL (ref 13.0–17.0)
MCHC: 34.6 g/dL (ref 30.0–36.0)
MCV: 91.6 fl (ref 78.0–100.0)
Platelets: 282 10*3/uL (ref 150.0–400.0)
RBC: 4.88 Mil/uL (ref 4.22–5.81)
RDW: 12.3 % (ref 11.5–15.5)
WBC: 6.5 10*3/uL (ref 4.0–10.5)

## 2018-10-27 LAB — T4, FREE: Free T4: 0.81 ng/dL (ref 0.60–1.60)

## 2018-10-27 MED ORDER — HYDROCORTISONE 2.5 % EX CREA: TOPICAL_CREAM | CUTANEOUS | 1 refills | Status: DC

## 2018-10-27 MED ORDER — GABAPENTIN 300 MG PO CAPS: 300.0000 mg | ORAL_CAPSULE | Freq: Every day | ORAL | 11 refills | Status: DC

## 2018-10-27 MED ORDER — EPINEPHRINE 0.3 MG/0.3ML IJ SOAJ: 0.3000 mg | Freq: Once | INTRAMUSCULAR | 1 refills | Status: DC | PRN

## 2018-10-27 NOTE — Addendum Note (Signed)
Addended by: Pilar Grammes on: 10/27/2018 12:48 PM   Modules accepted: Orders

## 2018-10-27 NOTE — Assessment & Plan Note (Signed)
Healthy Colon due 2029 Will defer PSA to next year Discussed fitness Flu vaccine and shingrix today

## 2018-10-27 NOTE — Assessment & Plan Note (Signed)
Ongoing pain and disability Minimal help from analgesics Will try gabapentin

## 2018-10-27 NOTE — Telephone Encounter (Signed)
[facility] Night - Client Nonclinical Telephone Record [facility] Client [place] Night - Client Client Site [place] Physician Viviana Simpler - MD Contact Type Call Who Is Calling Patient / Member / Family / Caregiver Caller Name [place] Phone Number [phone] Patient Name Willie Griffin Patient DOB [birth year] Call Type Message Only Information Provided Reason for Call Request for General Office Information Initial Comment Caller is running about 10 min late to appt. Additional Comment Call Closed By: Silvano Rusk Transaction Date/Time: 10/27/2018 7:51:17 AM (ET)

## 2018-10-27 NOTE — Progress Notes (Signed)
Subjective:    Patient ID: Willie Griffin, male    DOB: 1955-10-08, 63 y.o.   MRN: [id number]  HPI Here for physical  Still having back pain Lots of walking/bending over at work, etc Uses tylenol 3-4 times a day--and occasionally ibuprofen. This does help but not much Does find it limiting--feels it is sciatica--both sides at times No real effect on his sleep  Wife left him--unexpected  Has not been working full time since COVID No financial loss though  Current Outpatient Medications on File Prior to Visit  Medication Sig Dispense Refill  . acetaminophen (TYLENOL) 650 MG CR tablet Take 650 mg by mouth every 8 (eight) hours as needed for pain.    . hydrocortisone (ANUSOL-HC) 2.5 % rectal cream Place 1 application rectally 2 (two) times daily.    Marland Kitchen levothyroxine (SYNTHROID, LEVOTHROID) 25 MCG tablet TAKE 1 TABLET (25 MCG TOTAL) BY MOUTH DAILY BEFORE BREAKFAST. 90 tablet 3  . SOOLANTRA 1 % CREA Apply topically daily. to face  5  . triamcinolone cream (KENALOG) 0.1 % Uses as directed 30 g 2   No current facility-administered medications on file prior to visit.     Allergies  Allergen Reactions  . Bee Venom     Past Medical History:  Diagnosis Date  . Allergy    allergic rhinitis  . GERD (gastroesophageal reflux disease)   . Hypothyroidism   . Peyronie disease     Past Surgical History:  Procedure Laterality Date  . COLONOSCOPY    . INGUINAL HERNIA REPAIR  9/05  . MOHS SURGERY     nasal SCC----[facility]  . TOENAIL EXCISION     partial  . TONSILLECTOMY  1972  . 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 Heart attack Father   . Dementia Father        Lewy body dementia  . Arthritis Mother   . Coronary artery disease Paternal Grandfather   . Coronary artery disease Maternal Grandfather   . Suicidality Paternal Grandmother   . Breast cancer Paternal Grandmother   . Stroke Maternal Grandmother   . Hodgkin's lymphoma Sister    . Cancer Sister   . Other Sister        precancerous polyps  . Cancer Paternal Aunt        jaw ca  . Celiac disease Paternal Uncle   . Other Paternal Uncle        mesothelioma  . Breast cancer Other   . Diabetes Neg Hx   . Hypertension Neg Hx   . Colon polyps Neg Hx   . Colon cancer Neg Hx     Social History   Socioeconomic History  . Marital status: Legally Separated    Spouse name: Not on file  . Number of children: 2  . Years of education: Not on file  . Highest education level: Not on file  Occupational History  . 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  . Financial resource strain: Not on file  . Food insecurity    Worry: Not on file    Inability: Not on file  . Transportation needs    Medical: Not on file    Non-medical: Not on file  Tobacco Use  . 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 Smokeless tobacco: Never Used  Substance and Sexual Activity  . Alcohol use: No  .  Drug use: No  . Sexual activity: Not on file  Lifestyle  . Physical activity    Days per week: Not on file    Minutes per session: Not on file  . Stress: Not on file  Relationships  . Social Herbalist on phone: Not on file    Gets together: Not on file    Attends religious service: Not on file    Active member of club or organization: Not on file    Attends meetings of clubs or organizations: Not on file    Relationship status: Not on file  . Intimate partner violence    Fear of current or ex partner: Not on file    Emotionally abused: Not on file    Physically abused: Not on file    Forced sexual activity: Not on file  Other Topics Concern  . Not on file  Social History Narrative  . Not on file   Review of Systems  Constitutional: Negative for unexpected weight change.       Wears seat belt  HENT: Negative for dental problem, hearing loss and trouble swallowing.        Gets some leakage and crust in right ear Rare tinnitus  Keeps up with dentist  Eyes: Negative for visual disturbance.       No diplopia or unilateral vision loss  Respiratory: Positive for cough. Negative for shortness of breath.        Throat irritation still  Cardiovascular: Negative for chest pain and palpitations.       Some left leg swelling if on feet all day---varicose veins  Gastrointestinal: Negative for abdominal pain, blood in stool and constipation.       Does have heartburn--milk helps. Acid reducing meds didn't help  Endocrine: Negative for polydipsia and polyuria.  Genitourinary: Negative for difficulty urinating and urgency.       Some ED---no sex now due to separation Has bent penis since hernia operation--?Peyronnie's  Musculoskeletal: Positive for back pain.  Skin:       Will be going to Dr Romona Curls [facility] on nose New rough patch left temple  Allergic/Immunologic: Positive for environmental allergies. Negative for immunocompromised state.       Rarely uses the zyrtec  Neurological: Negative for dizziness, syncope and light-headedness.       Rare tension headaches  Hematological: Negative for adenopathy. Bruises/bleeds easily.  Psychiatric/Behavioral: Negative for dysphoric mood. The patient is not nervous/anxious.        Mild sleep issues at times       Objective:   Physical Exam  Constitutional: He is oriented to person, place, and time. He appears well-developed. No distress.  HENT:  Head: Normocephalic and atraumatic.  Right Ear: External ear normal.  Left Ear: External ear normal.  Mouth/Throat: Oropharynx is clear and moist. No oropharyngeal exudate.  Eyes: Pupils are equal, round, and reactive to light. Conjunctivae are normal.  Neck: No thyromegaly present.  Cardiovascular: Normal rate, regular rhythm, normal heart sounds and intact distal pulses. Exam reveals no gallop.  No murmur heard. Respiratory: Effort normal and breath sounds normal. No respiratory distress. He has no wheezes. He has no rales.   GI: Soft. There is no abdominal tenderness.  Musculoskeletal:        General: No tenderness or edema.     Comments: Has to move slowly due to back  Lymphadenopathy:    He has no cervical adenopathy.  Neurological: He is alert and oriented to  person, place, and time.  Skin: No rash noted. No erythema.  Psychiatric: He has a normal mood and affect. His behavior is normal.           Assessment & Plan:

## 2018-10-27 NOTE — Telephone Encounter (Signed)
Per chart review pts office visit is in progress.

## 2018-10-27 NOTE — Assessment & Plan Note (Signed)
Seems euthyroid

## 2018-12-29 ENCOUNTER — Ambulatory Visit: Payer: PRIVATE HEALTH INSURANCE

## 2019-01-13 ENCOUNTER — Other Ambulatory Visit: Payer: Self-pay

## 2019-01-13 ENCOUNTER — Ambulatory Visit (INDEPENDENT_AMBULATORY_CARE_PROVIDER_SITE_OTHER): Payer: PRIVATE HEALTH INSURANCE

## 2019-01-13 DIAGNOSIS — Z23 Encounter for immunization: Secondary | ICD-10-CM

## 2019-03-05 ENCOUNTER — Other Ambulatory Visit: Payer: Self-pay | Admitting: Internal Medicine

## 2019-06-21 ENCOUNTER — Other Ambulatory Visit: Payer: Self-pay | Admitting: Internal Medicine

## 2019-11-08 ENCOUNTER — Encounter: Payer: PRIVATE HEALTH INSURANCE | Admitting: Internal Medicine

## 2019-11-23 ENCOUNTER — Ambulatory Visit (INDEPENDENT_AMBULATORY_CARE_PROVIDER_SITE_OTHER): Payer: PRIVATE HEALTH INSURANCE | Admitting: Internal Medicine

## 2019-11-23 ENCOUNTER — Encounter: Payer: Self-pay | Admitting: Internal Medicine

## 2019-11-23 ENCOUNTER — Other Ambulatory Visit: Payer: Self-pay

## 2019-11-23 ENCOUNTER — Ambulatory Visit (INDEPENDENT_AMBULATORY_CARE_PROVIDER_SITE_OTHER)
Admission: RE | Admit: 2019-11-23 | Discharge: 2019-11-23 | Disposition: A | Discharge status: Home / Self Care | Payer: PRIVATE HEALTH INSURANCE | Source: Ambulatory Visit | Attending: Internal Medicine | Admitting: Internal Medicine

## 2019-11-23 VITALS — BP 112/82 | HR 73 | Temp 98.1°F | Ht 67.75 in | Wt 186.0 lb

## 2019-11-23 DIAGNOSIS — K219 Gastro-esophageal reflux disease without esophagitis: Secondary | ICD-10-CM | POA: Diagnosis not present

## 2019-11-23 DIAGNOSIS — R0600 Dyspnea, unspecified: Secondary | ICD-10-CM

## 2019-11-23 DIAGNOSIS — Z125 Encounter for screening for malignant neoplasm of prostate: Secondary | ICD-10-CM

## 2019-11-23 DIAGNOSIS — M549 Dorsalgia, unspecified: Secondary | ICD-10-CM

## 2019-11-23 DIAGNOSIS — G8929 Other chronic pain: Secondary | ICD-10-CM

## 2019-11-23 DIAGNOSIS — E039 Hypothyroidism, unspecified: Secondary | ICD-10-CM | POA: Diagnosis not present

## 2019-11-23 DIAGNOSIS — Z0000 Encounter for general adult medical examination without abnormal findings: Secondary | ICD-10-CM | POA: Diagnosis not present

## 2019-11-23 DIAGNOSIS — R0609 Other forms of dyspnea: Secondary | ICD-10-CM | POA: Insufficient documentation

## 2019-11-23 LAB — COMPREHENSIVE METABOLIC PANEL
ALT: 21 U/L (ref 0–53)
AST: 19 U/L (ref 0–37)
Albumin: 4.2 g/dL (ref 3.5–5.2)
Alkaline Phosphatase: 57 U/L (ref 39–117)
BUN: 16 mg/dL (ref 6–23)
CO2: 30 mEq/L (ref 19–32)
Calcium: 8.9 mg/dL (ref 8.4–10.5)
Chloride: 102 mEq/L (ref 96–112)
Creatinine, Ser: 1.08 mg/dL (ref 0.40–1.50)
GFR: 73.42 mL/min (ref >60.00)
Glucose, Bld: 105 mg/dL — ABNORMAL HIGH (ref 70–99)
Potassium: 4.3 mEq/L (ref 3.5–5.1)
Sodium: 138 mEq/L (ref 135–145)
Total Bilirubin: 0.9 mg/dL (ref 0.2–1.2)
Total Protein: 6.1 g/dL (ref 6.0–8.3)

## 2019-11-23 LAB — CBC
HCT: 46 % (ref 39.0–52.0)
Hemoglobin: 15.9 g/dL (ref 13.0–17.0)
MCHC: 34.7 g/dL (ref 30.0–36.0)
MCV: 89.8 fl (ref 78.0–100.0)
Platelets: 272 10*3/uL (ref 150.0–400.0)
RBC: 5.12 Mil/uL (ref 4.22–5.81)
RDW: 12.4 % (ref 11.5–15.5)
WBC: 6.4 10*3/uL (ref 4.0–10.5)

## 2019-11-23 LAB — LIPID PANEL
Cholesterol: 166 mg/dL (ref 0–200)
HDL: 42.7 mg/dL (ref >39.00)
LDL Cholesterol: 104 mg/dL — ABNORMAL HIGH (ref 0–99)
NonHDL: 123.2
Total CHOL/HDL Ratio: 4
Triglycerides: 94 mg/dL (ref 0.0–149.0)
VLDL: 18.8 mg/dL (ref 0.0–40.0)

## 2019-11-23 LAB — PSA: PSA: 1.7 ng/mL (ref 0.10–4.00)

## 2019-11-23 LAB — T4, FREE: Free T4: 0.63 ng/dL (ref 0.60–1.60)

## 2019-11-23 LAB — SEDIMENTATION RATE: Sed Rate: 6 mm/hr (ref 0–20)

## 2019-11-23 LAB — TSH: TSH: 1.24 u[IU]/mL (ref 0.35–4.50)

## 2019-11-23 MED ORDER — HYDROCORTISONE 2.5 % EX CREA: TOPICAL_CREAM | CUTANEOUS | 5 refills | Status: DC

## 2019-11-23 MED ORDER — OMEPRAZOLE 20 MG PO CPDR: 20.0000 mg | DELAYED_RELEASE_CAPSULE | Freq: Every day | ORAL | 3 refills | Status: DC

## 2019-11-23 NOTE — Patient Instructions (Addendum)
Please try a fiber supplement like metamucil or fibercon. You could also try 1 imodium after your first stool of the day--if you are going out. If your bowel issues persist, I can set you up with the GI specialist. Please start the omeprazole daily on an empty stomach. If your heartburn and throat symptoms calm down, after 2 months you can reduce it (like to every other day or 3 days per week).

## 2019-11-23 NOTE — Assessment & Plan Note (Signed)
Will check labs on levothyroxine 

## 2019-11-23 NOTE — Assessment & Plan Note (Signed)
Better with tumeric supplement

## 2019-11-23 NOTE — Assessment & Plan Note (Addendum)
Sounds like a classic anginal picture Will check EKG/CXR Probably for cardiology evaluation  CXR looks normal--will await overread EKG shows sinus bradycardia at 58. Normal axis and intervals. No hypertrophy or ischemic changes. No comparison  Will proceed with cardiology evaluation

## 2019-11-23 NOTE — Assessment & Plan Note (Signed)
Seems healthy but concern about the DOE Colon due 2029---but would push up if ongoing bowel issues Will check PSA Discussed exercise He wants to hold off on flu vaccine

## 2019-11-23 NOTE — Progress Notes (Signed)
Subjective:    Patient ID: Willie Griffin, male    DOB: 10-11-1955, 64 y.o.   MRN: [id number]  HPI Here for physical This visit occurred during the SARS-CoV-2 public health emergency.  Safety protocols were in place, including screening questions prior to the visit, additional usage of staff PPE, and extensive cleaning of exam room while observing appropriate contact time as indicated for disinfecting solutions.   Some concerns --"I can't do like I used to" Will get winded "to the point of passing out" with any physical work Both at work and at home No clear chest pain---but ?some pressure Gets better with rest fairly quickly  No regular exercise---though back is better Takes "natural stuff" now. Naproxen did take away the pain completely---but he switched to tumeric based supplement Did have bad 2 week stretch with sciatica---better now (aleve does help)  Bowels not right for several weeks Have been loose but not frank diarrhea No abdominal pain but has urgency. No blood Formerly went 3 times per day---now closer to 6 times  Is retiring in January Still separated  Current Outpatient Medications on File Prior to Visit  Medication Sig Dispense Refill  . acetaminophen (TYLENOL) 650 MG CR tablet Take 650 mg by mouth every 8 (eight) hours as needed for pain.    Marland Kitchen EPINEPHrine 0.3 mg/0.3 mL IJ SOAJ injection Inject 0.3 mLs (0.3 mg total) into the muscle once as needed for up to 1 dose. 2 each 1  . fluticasone (FLONASE) 50 MCG/ACT nasal spray Place into both nostrils daily as needed for allergies or rhinitis.    Marland Kitchen levothyroxine (SYNTHROID) 25 MCG tablet TAKE 1 TABLET (25 MCG TOTAL) BY MOUTH DAILY BEFORE BREAKFAST. 90 tablet 3  . Magnesium 500 MG CAPS Take by mouth.    . SOOLANTRA 1 % CREA Apply topically daily. to face  5  . triamcinolone cream (KENALOG) 0.1 % Uses as directed 30 g 2   No current facility-administered medications on file prior to visit.    Allergies  Allergen  Reactions  . Bee Venom     Past Medical History:  Diagnosis Date  . Allergy    allergic rhinitis  . GERD (gastroesophageal reflux disease)   . Hypothyroidism   . Peyronie disease     Past Surgical History:  Procedure Laterality Date  . COLONOSCOPY    . INGUINAL HERNIA REPAIR  9/05  . MOHS SURGERY     nasal SCC----[facility]  . TOENAIL EXCISION     partial  . TONSILLECTOMY  1972  . 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 Heart attack Father   . Dementia Father        Lewy body dementia  . Arthritis Mother   . Coronary artery disease Paternal Grandfather   . Coronary artery disease Maternal Grandfather   . Suicidality Paternal Grandmother   . Breast cancer Paternal Grandmother   . Stroke Maternal Grandmother   . Hodgkin's lymphoma Sister   . Cancer Sister   . Other Sister        precancerous polyps  . Cancer Paternal Aunt        jaw ca  . Celiac disease Paternal Uncle   . Other Paternal Uncle        mesothelioma  . Breast cancer Other   . Diabetes Neg Hx   . Hypertension Neg Hx   . Colon polyps Neg Hx   .  Colon cancer Neg Hx     Social History   Socioeconomic History  . Marital status: Legally Separated    Spouse name: Not on file  . Number of children: 2  . Years of education: Not on file  . Highest education level: Not on file  Occupational History  . 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 Smokeless tobacco: Never Used  Substance and Sexual Activity  . Alcohol use: No  . Drug use: No  . Sexual activity: Not on file  Other Topics Concern  . Not on file  Social History Narrative  . Not on file   Social Determinants of Health   Financial Resource Strain:   . Difficulty of Paying Living Expenses: Not on file  Food Insecurity:   . Worried About Charity fundraiser in the Last Year: Not on file  . Ran Out of Food in the Last  Year: Not on file  Transportation Needs:   . Lack of Transportation (Medical): Not on file  . Lack of Transportation (Non-Medical): Not on file  Physical Activity:   . Days of Exercise per Week: Not on file  . Minutes of Exercise per Session: Not on file  Stress:   . Feeling of Stress : Not on file  Social Connections:   . Frequency of Communication with Friends and Family: Not on file  . Frequency of Social Gatherings with Friends and Family: Not on file  . Attends Religious Services: Not on file  . Active Member of Clubs or Organizations: Not on file  . Attends Archivist Meetings: Not on file  . Marital Status: Not on file  Intimate Partner Violence:   . Fear of Current or Ex-Partner: Not on file  . Emotionally Abused: Not on file  . Physically Abused: Not on file  . Sexually Abused: Not on file   Review of Systems  Constitutional: Negative for fatigue and unexpected weight change.       Wears seat belt  HENT: Positive for tinnitus. Negative for dental problem and hearing loss.   Eyes: Negative for visual disturbance.       No diplopia or unilateral vision loss Some decline in vision  Respiratory: Positive for shortness of breath. Negative for chest tightness.        Has chronic cough--like something in throat  Cardiovascular: Negative for palpitations.       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  Genitourinary: Positive for difficulty urinating.       Slight dribbling Nocturia x 1 generally No sex recently  Musculoskeletal: Positive for back pain. Negative for joint swelling.       Scattered joint pains better on the supplement  Skin: Negative for rash.       Goes to derm tomorrow--a few spots to be checked  Allergic/Immunologic: Positive for environmental allergies. Negative for immunocompromised state.       Uses flonase  sporadically --but stays congested  Neurological: Positive for light-headedness. Negative for dizziness, syncope and headaches.       Some numbness in left 3rd and 4th fingers--better when he pulls out (pops) his fingers  Hematological: Negative for adenopathy. Bruises/bleeds easily.  Psychiatric/Behavioral: Negative for dysphoric mood and sleep disturbance. The patient is not nervous/anxious.  Objective:   Physical Exam Constitutional:      Appearance: Normal appearance.  HENT:     Right Ear: Tympanic membrane, ear canal and external ear normal.     Left Ear: Tympanic membrane, ear canal and external ear normal.     Mouth/Throat:     Pharynx: No oropharyngeal exudate or posterior oropharyngeal erythema.  Eyes:     Conjunctiva/sclera: Conjunctivae normal.     Pupils: Pupils are equal, round, and reactive to light.  Cardiovascular:     Rate and Rhythm: Normal rate and regular rhythm.     Pulses: Normal pulses.     Heart sounds: No murmur heard.  No gallop.   Pulmonary:     Effort: Pulmonary effort is normal.     Breath sounds: Normal breath sounds. No wheezing or rales.  Abdominal:     Palpations: Abdomen is soft.     Tenderness: There is no abdominal tenderness.  Musculoskeletal:     Cervical back: Neck supple.     Right lower leg: No edema.     Left lower leg: No edema.  Lymphadenopathy:     Cervical: No cervical adenopathy.  Skin:    General: Skin is warm.     Findings: No rash.  Neurological:     General: No focal deficit present.     Mental Status: He is alert and oriented to person, place, and time.  Psychiatric:        Mood and Affect: Mood normal.        Behavior: Behavior normal.            Assessment & Plan:

## 2019-11-23 NOTE — Assessment & Plan Note (Signed)
May be related to his persistent throat symptoms Will try omeprazole

## 2019-12-16 ENCOUNTER — Encounter: Payer: Self-pay | Admitting: Cardiology

## 2019-12-16 ENCOUNTER — Other Ambulatory Visit: Payer: Self-pay

## 2019-12-16 ENCOUNTER — Ambulatory Visit: Payer: PRIVATE HEALTH INSURANCE | Admitting: Cardiology

## 2019-12-16 VITALS — BP 140/76 | HR 61 | Ht 67.5 in | Wt 188.2 lb

## 2019-12-16 DIAGNOSIS — R0600 Dyspnea, unspecified: Secondary | ICD-10-CM

## 2019-12-16 DIAGNOSIS — R072 Precordial pain: Secondary | ICD-10-CM | POA: Diagnosis not present

## 2019-12-16 DIAGNOSIS — R0609 Other forms of dyspnea: Secondary | ICD-10-CM

## 2019-12-16 MED ORDER — METOPROLOL TARTRATE 100 MG PO TABS: 100.0000 mg | ORAL_TABLET | Freq: Once | ORAL | 0 refills | Status: DC

## 2019-12-16 NOTE — Patient Instructions (Signed)
Medication Instructions:  Your physician recommends that you continue on your current medications as directed. Please refer to the Current Medication list given to you today.  *If you need a refill on your cardiac medications before your next appointment, please call your pharmacy*   Lab Work: Your physician recommends that you return for lab work in: as soon as your CTA is scheduled.  If you have labs (blood work) drawn today and your tests are completely normal, you will receive your results only by: Marland Kitchen MyChart Message (if you have MyChart) OR . A paper copy in the mail If you have any lab test that is abnormal or we need to change your treatment, we will call you to review the results.   Testing/Procedures:  1.  Your physician has requested that you have an echocardiogram. Echocardiography is a painless test that uses sound waves to create images of your heart. It provides your doctor with information about the size and shape of your heart and how well your heart's chambers and valves are working. This procedure takes approximately one hour. There are no restrictions for this procedure.  2.  Your physician has requested that you have cardiac CT. Cardiac computed tomography (CT) is a painless test that uses an x-ray machine to take clear, detailed pictures of your heart.   Your cardiac CT will be scheduled at:  [facility] [address] [place], [place] [address] [phone]  Please arrive 15 mins early for check-in and test prep.   Please follow these instructions carefully (unless otherwise directed):   Hold all erectile dysfunction medications at least 3 days (72 hrs) prior to test.   On the Night Before the Test: . Be sure to Drink plenty of water. . Do not consume any caffeinated/decaffeinated beverages or chocolate 12 hours prior to your test. . Do not take any antihistamines 12 hours prior to your test.   On the Day of  the Test: . Drink plenty of water. Do not drink any water within one hour of the test. . Do not eat any food 4 hours prior to the test. . You may take your regular medications prior to the test.  . Take metoprolol (Lopressor) two hours prior to test. This was sent to your pharmacy.   After the Test: . Drink plenty of water. . After receiving IV contrast, you may experience a mild flushed feeling. This is normal. . On occasion, you may experience a mild rash up to 24 hours after the test. This is not dangerous. If this occurs, you can take Benadryl 25 mg and increase your fluid intake. . If you experience trouble breathing, this can be serious. If it is severe call 911 IMMEDIATELY. If it is mild, please call our office. . If you take any of these medications: Glipizide/Metformin, Avandament, Glucavance, please do not take 48 hours after completing test unless otherwise instructed.   Once we have confirmed authorization from your insurance company, we will call you to set up a date and time for your test. Based on how quickly your insurance processes prior authorizations requests, please allow up to 4 weeks to be contacted for scheduling your Cardiac CT appointment. Be advised that routine Cardiac CT appointments could be scheduled as many as 8 weeks after your provider has ordered it.  For non-scheduling related questions, please contact the cardiac imaging nurse navigator should you have any questions/concerns: Marchia Bond, Cardiac Imaging Nurse Navigator [place], Interim Cardiac  Imaging Nurse [place] [facility] Direct Office Dial: [phone]   For scheduling needs, including cancellations and rescheduling, please call [place], [phone] (temporary number).    Follow-Up: At [facility], you and your health needs are our priority.  As part of our continuing mission to provide you with exceptional heart care, we have created designated Provider Care  Teams.  These Care Teams include your primary Cardiologist (physician) and Advanced Practice Providers (APPs -  Physician Assistants and Nurse Practitioners) who all work together to provide you with the care you need, when you need it.  We recommend signing up for the patient portal called "MyChart".  Sign up information is provided on this After Visit Summary.  MyChart is used to connect with patients for Virtual Visits (Telemedicine).  Patients are able to view lab/test results, encounter notes, upcoming appointments, etc.  Non-urgent messages can be sent to your provider as well.   To learn more about what you can do with MyChart, go to [email or web address].    Your next appointment:   Follow up after Echo and CTA   The format for your next appointment:   In Person  Provider:   Kate Sable, MD   Other Instructions

## 2019-12-16 NOTE — Progress Notes (Signed)
Cardiology Office Note:    Date:  12/16/2019   ID:  CALI CUARTAS, DOB 10-26-1955, MRN [id number]  PCP:  [name], MD  [facility] Cardiologist:  Kate Sable, MD  [place] Electrophysiologist:  None   Referring MD: [name], MD   Chief Complaint  Patient presents with  . New Patient (Initial Visit)    Ref by Dr. Silvio Pate for chest pressure on exertion. Pt. c/o chest pressure/ lightheaded with position changes and shortness of breath on exertion.      Curlee Bogan Barona is a 64 y.o. male who is being seen today for the evaluation of chest pain at the request of [name], MD.   History of Present Illness:    JABIN TAPP is a 64 y.o. male with a hx of GERD, hypothyroidism who presents due to chest pain and shortness of breath.  Patient states having exertional shortness of breath and chest pressure over the past 5 months.  Symptoms typically occur when performing activities such as shoveling.  Chest pressure is relieved with resting.  Rates pressure as 2 out of 10 in severity when intercourse.  Shortness of breath also improves with resting.  He denies any personal history of heart disease.  States his father had about 11 stents placed, unsure of atrial for stent.  Mom had stents placed around age 47.  Denies smoking.  Past Medical History:  Diagnosis Date  . Allergy    allergic rhinitis  . GERD (gastroesophageal reflux disease)   . Hypothyroidism   . Peyronie disease     Past Surgical History:  Procedure Laterality Date  . COLONOSCOPY    . INGUINAL HERNIA REPAIR  9/05  . MOHS SURGERY     nasal SCC----[facility]  . TOENAIL EXCISION     partial  . TONSILLECTOMY  1972  . 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     Allergies:   Bee venom and Other   Social History   Socioeconomic History  .  Marital status: Legally Separated    Spouse name: Not on file  . Number of children: 2  . Years of education: Not on file  . Highest education level: Not on file  Occupational History  . 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 Smokeless tobacco: Never Used  Vaping Use  . Vaping Use: Never used  Substance and Sexual Activity  . Alcohol use: No  . Drug use: No  . Sexual activity: Not on file  Other Topics Concern  . Not on file  Social History Narrative  . Not on file   Social Determinants of Health   Financial Resource Strain:   . Difficulty of Paying Living Expenses: Not on file  Food Insecurity:   . Worried About Charity fundraiser in the Last Year: Not on file  . Ran Out of Food in the Last Year: Not on file  Transportation Needs:   . Lack of Transportation (Medical): Not on file  . Lack of Transportation (Non-Medical): Not on file  Physical Activity:   . Days of Exercise per Week: Not on file  . Minutes of Exercise per Session: Not on file  Stress:   . Feeling of Stress : Not on file  Social Connections:   . Frequency of Communication with Friends and Family: Not on file  . Frequency of Social Gatherings with Friends and Family: Not on file  . Attends Religious Services: Not on file  . Active Member of Clubs or Organizations: Not on file  . Attends Archivist Meetings: Not on file  . Marital Status: Not on file     Family History: The patient's family history includes Arthritis in his mother; Breast cancer in his paternal grandmother and another family member; Cancer in his paternal aunt and sister; Celiac disease in his paternal uncle; Coronary artery disease in his maternal grandfather and paternal grandfather; Dementia in his father; Heart attack in his father and mother; Heart disease in his father; Hodgkin's lymphoma in his sister; Other in his paternal uncle and sister;  Stroke in his maternal grandmother; Suicidality in his paternal grandmother. There is no history of Diabetes, Hypertension, Colon polyps, or Colon cancer.  ROS:   Please see the history of present illness.     All other systems reviewed and are negative.  EKGs/Labs/Other Studies Reviewed:    The following studies were reviewed today:   EKG:  EKG is  ordered today.  The ekg ordered today demonstrates normal sinus rhythm, normal ECG.  Recent Labs: 11/23/2019: ALT 21; BUN 16; Creatinine, Ser 1.08; Hemoglobin 15.9; Platelets 272.0; Potassium 4.3; Sodium 138; TSH 1.24  Recent Lipid Panel    Component Value Date/Time   CHOL 166 11/23/2019 0831   TRIG 94.0 11/23/2019 0831   HDL 42.70 11/23/2019 0831   CHOLHDL 4 11/23/2019 0831   VLDL 18.8 11/23/2019 0831   LDLCALC 104 (H) 11/23/2019 0831     Risk Assessment/Calculations:      Physical Exam:    VS:  BP 140/76 (BP Location: Right Arm, Patient Position: Sitting, Cuff Size: Normal)   Pulse 61   Ht 5' 7.5" (1.715 m)   Wt 188 lb 4 oz (85.4 kg)   SpO2 97%   BMI 29.05 kg/m     Wt Readings from Last 3 Encounters:  12/16/19 188 lb 4 oz (85.4 kg)  11/23/19 186 lb (84.4 kg)  10/27/18 179 lb (81.2 kg)     GEN:  Well nourished, well developed in no acute distress HEENT: Normal NECK: No JVD; No carotid bruits LYMPHATICS: No lymphadenopathy CARDIAC: RRR, no murmurs, rubs, gallops RESPIRATORY:  Clear to auscultation without rales, wheezing or rhonchi  ABDOMEN: Soft, non-tender, non-distended MUSCULOSKELETAL:  No edema; No deformity  SKIN: Warm and dry NEUROLOGIC:  Alert and oriented x 3 PSYCHIATRIC:  Normal affect   ASSESSMENT:    1. Precordial pain   2. Dyspnea on exertion    PLAN:    In order of problems listed above:  1. Patient with symptoms of chest pressure and shortness of breath consistent with angina pectoris.  Risk factors include age and family history of CAD.  Get echocardiogram, get coronary CTA to evaluate  presence of CAD. 2. Shortness of breath with exertion, echo as above.  Follow up after echo and coronary CTA.    Shared Decision Making/Informed Consent       Medication Adjustments/Labs and Tests Ordered: Current medicines are reviewed at length with the patient today.  Concerns regarding medicines are outlined above.  Orders Placed This Encounter  Procedures  . CT CORONARY MORPH W/CTA COR W/SCORE W/CA W/CM &/OR WO/CM  . CT CORONARY FRACTIONAL FLOW RESERVE DATA PREP  . CT CORONARY FRACTIONAL FLOW RESERVE FLUID ANALYSIS  .  EKG 12-Lead  . ECHOCARDIOGRAM COMPLETE   Meds ordered this encounter  Medications  . metoprolol tartrate (LOPRESSOR) 100 MG tablet    Sig: Take 1 tablet (100 mg total) by mouth once for 1 dose. Take 2 hours prior to your CT scan.    Dispense:  1 tablet    Refill:  0    Patient Instructions  Medication Instructions:  Your physician recommends that you continue on your current medications as directed. Please refer to the Current Medication list given to you today.  *If you need a refill on your cardiac medications before your next appointment, please call your pharmacy*   Lab Work: Your physician recommends that you return for lab work in: as soon as your CTA is scheduled.  If you have labs (blood work) drawn today and your tests are completely normal, you will receive your results only by: Marland Kitchen MyChart Message (if you have MyChart) OR . A paper copy in the mail If you have any lab test that is abnormal or we need to change your treatment, we will call you to review the results.   Testing/Procedures:  1.  Your physician has requested that you have an echocardiogram. Echocardiography is a painless test that uses sound waves to create images of your heart. It provides your doctor with information about the size and shape of your heart and how well your heart's chambers and valves are working. This procedure takes approximately one hour. There are no  restrictions for this procedure.  2.  Your physician has requested that you have cardiac CT. Cardiac computed tomography (CT) is a painless test that uses an x-ray machine to take clear, detailed pictures of your heart.   Your cardiac CT will be scheduled at:  [facility] [address] [place], [place] [address] [phone]  Please arrive 15 mins early for check-in and test prep.   Please follow these instructions carefully (unless otherwise directed):   Hold all erectile dysfunction medications at least 3 days (72 hrs) prior to test.   On the Night Before the Test: . Be sure to Drink plenty of water. . Do not consume any caffeinated/decaffeinated beverages or chocolate 12 hours prior to your test. . Do not take any antihistamines 12 hours prior to your test.   On the Day of the Test: . Drink plenty of water. Do not drink any water within one hour of the test. . Do not eat any food 4 hours prior to the test. . You may take your regular medications prior to the test.  . Take metoprolol (Lopressor) two hours prior to test. This was sent to your pharmacy.   After the Test: . Drink plenty of water. . After receiving IV contrast, you may experience a mild flushed feeling. This is normal. . On occasion, you may experience a mild rash up to 24 hours after the test. This is not dangerous. If this occurs, you can take Benadryl 25 mg and increase your fluid intake. . If you experience trouble breathing, this can be serious. If it is severe call 911 IMMEDIATELY. If it is mild, please call our office. . If you take any of these medications: Glipizide/Metformin, Avandament, Glucavance, please do not take 48 hours after completing test unless otherwise instructed.   Once we have confirmed authorization from your insurance company, we will call you to set up a date and time for your test. Based on how quickly your insurance processes prior  authorizations requests, please allow up to 4 weeks to be contacted for scheduling your Cardiac CT appointment. Be advised that routine Cardiac CT appointments could be scheduled as many as 8 weeks after your provider has ordered it.  For non-scheduling related questions, please contact the cardiac imaging nurse navigator should you have any questions/concerns: Marchia Bond, Cardiac Imaging Nurse Navigator Burley Saver, Interim Cardiac Imaging Nurse [place] [facility] Direct Office Dial: [phone]   For scheduling needs, including cancellations and rescheduling, please call [place], [phone] (temporary number).    Follow-Up: At [facility], you and your health needs are our priority.  As part of our continuing mission to provide you with exceptional heart care, we have created designated Provider Care Teams.  These Care Teams include your primary Cardiologist (physician) and Advanced Practice Providers (APPs -  Physician Assistants and Nurse Practitioners) who all work together to provide you with the care you need, when you need it.  We recommend signing up for the patient portal called "MyChart".  Sign up information is provided on this After Visit Summary.  MyChart is used to connect with patients for Virtual Visits (Telemedicine).  Patients are able to view lab/test results, encounter notes, upcoming appointments, etc.  Non-urgent messages can be sent to your provider as well.   To learn more about what you can do with MyChart, go to [email or web address].    Your next appointment:   Follow up after Echo and CTA   The format for your next appointment:   In Person  Provider:   Kate Sable, MD   Other Instructions       Signed, Kate Sable, MD  12/16/2019 12:49 PM    [place]

## 2020-01-11 ENCOUNTER — Ambulatory Visit (INDEPENDENT_AMBULATORY_CARE_PROVIDER_SITE_OTHER): Payer: PRIVATE HEALTH INSURANCE

## 2020-01-11 ENCOUNTER — Other Ambulatory Visit: Payer: Self-pay

## 2020-01-11 DIAGNOSIS — R072 Precordial pain: Secondary | ICD-10-CM | POA: Diagnosis not present

## 2020-01-11 LAB — ECHOCARDIOGRAM COMPLETE
AR max vel: 2.85 cm2
AV Area VTI: 3 cm2
AV Area mean vel: 2.62 cm2
AV Mean grad: 2 mmHg
AV Peak grad: 4 mmHg
Ao pk vel: 1 m/s
Area-P 1/2: 4.19 cm2
Calc EF: 53.3 %
S' Lateral: 3.1 cm
Single Plane A2C EF: 53.5 %
Single Plane A4C EF: 55 %

## 2020-01-23 ENCOUNTER — Ambulatory Visit: Payer: PRIVATE HEALTH INSURANCE | Admitting: Cardiology

## 2020-02-02 ENCOUNTER — Telehealth: Payer: Self-pay

## 2020-02-02 DIAGNOSIS — R072 Precordial pain: Secondary | ICD-10-CM

## 2020-02-02 NOTE — Telephone Encounter (Signed)
Scheduled

## 2020-02-02 NOTE — Telephone Encounter (Signed)
Called patient and informed him of Dr. Merita Norton recommendation to have a [employer]. Reviewed with  patient detailed instructions and sent him a copy through his MyChart. Patient verbalized understanding and agreed with plan.  Will send to scheduling to schedule Myoview and follow up appointment with Dr. Azucena Cecil. (patient stated he needs this done prior to 02/27/20)

## 2020-02-02 NOTE — Telephone Encounter (Signed)
-----   Message from Debbe Odea, MD sent at 02/01/2020  6:33 PM EST ----- Okay to obtain [facility]  Thank you ----- Message ----- From: Gibson Ramp, RN Sent: 01/31/2020   3:14 PM EST To: Florina Ou, Debbe Odea, MD  Thank you Marchelle Folks!   ----- Message ----- From: Florina Ou Sent: 01/31/2020   2:50 PM EST To: Gibson Ramp, RN  Willie Griffin,  [employer] has denied the request for the CCTA. They would like him to have a stress test prior to the approval for the CCTA.   The medical director said that if he had high troponin levels they would bypass the stress test otherwise it's denied for being not medically necessary.   Thank you so much,  Marchelle Folks  ----- Message ----- From: Gibson Ramp, RN Sent: 01/19/2020  12:14 PM EST To: Florina Ou  No worries! We have rescheduled his follow up for now. And I am leaving the office soon until Monday.  Have a [place] Christmas and thanks for all of your help!  Rechy Bost [place] RN    ----- Message ----- From: Florina Ou Sent: 01/19/2020  10:23 AM EST To: Gibson Ramp, RN  Good morning.  I called yesterday and it was still in medical director review. I left a message for the nurse to call me back regarding the case. I will update when I speak with her.   [employer] has become pretty strict on their approval for CCTAs without a stress test. I will call back here in a few.  Thank you,  Marchelle Folks  ----- Message ----- From: Gibson Ramp, RN Sent: 01/19/2020   9:42 AM EST To: Florina Ou  Any update with this patient?   ----- Message ----- From: Florina Ou Sent: 01/12/2020   3:44 PM EST To: Jefferey Pica, RN, Lorrin Jackson, #  Good afternoon,  Auth is still pending w/ yesterday's echo report. It's now in Medical Director review. [employer] was waiting on the report before approving or denying.  Thank you,  Marchelle Folks  ----- Message ----- From:  Jefferey Pica, RN Sent: 01/12/2020   3:16 PM EST To: Lowella Dell, RN, #  I called this patient and spoke with him regarding an echo report- he states he has not been contacted about his Cardiac CT- he was told last week this was still pending insurance approval.   Just checking on the status again as he is due to follow up with Dr. Azucena Cecil on 01/23/20, just trying to assess if we might need to move this.   Thanks!

## 2020-02-24 ENCOUNTER — Encounter: Payer: Self-pay | Admitting: Cardiology

## 2020-02-24 ENCOUNTER — Other Ambulatory Visit: Payer: Self-pay

## 2020-02-24 ENCOUNTER — Ambulatory Visit: Payer: PRIVATE HEALTH INSURANCE | Admitting: Cardiology

## 2020-02-24 VITALS — BP 120/84 | HR 81 | Ht 68.0 in | Wt 194.0 lb

## 2020-02-24 DIAGNOSIS — R072 Precordial pain: Secondary | ICD-10-CM | POA: Diagnosis not present

## 2020-02-24 DIAGNOSIS — E7800 Pure hypercholesterolemia, unspecified: Secondary | ICD-10-CM | POA: Diagnosis not present

## 2020-02-24 NOTE — Patient Instructions (Signed)
Medication Instructions:  Your physician recommends that you continue on your current medications as directed. Please refer to the Current Medication list given to you today.  *If you need a refill on your cardiac medications before your next appointment, please call your pharmacy*   Lab Work: None Ordered    Testing/Procedures:  We will order CT coronary calcium score  $99 at our [facility] in [place]  Please call Colletta [place] at [phone] to schedule   [place], [place] [address]   Follow-Up: At [facility], you and your health needs are our priority.  As part of our continuing mission to provide you with exceptional heart care, we have created designated Provider Care Teams.  These Care Teams include your primary Cardiologist (physician) and Advanced Practice Providers (APPs -  Physician Assistants and Nurse Practitioners) who all work together to provide you with the care you need, when you need it.  We recommend signing up for the patient portal called "MyChart".  Sign up information is provided on this After Visit Summary.  MyChart is used to connect with patients for Virtual Visits (Telemedicine).  Patients are able to view lab/test results, encounter notes, upcoming appointments, etc.  Non-urgent messages can be sent to your provider as well.   To learn more about what you can do with MyChart, go to [email or web address].    Your next appointment:   1 month(s)  The format for your next appointment:   In Person  Provider:   Kate Sable, MD   Other Instructions

## 2020-02-24 NOTE — Progress Notes (Signed)
Cardiology Office Note:    Date:  02/24/2020   ID:  Willie Griffin, DOB 01-14-1956, MRN [id number]  PCP:  [name], MD  [facility] Cardiologist:  Kate Sable, MD  [place] Electrophysiologist:  None   Referring MD: [name], MD   Chief Complaint  Patient presents with  . Follow-up    Patient did not have myoview performed due to cost of test. Patient reports elevated blood pressure readings.     History of Present Illness:    Willie Griffin is a 65 y.o. male with a hx of GERD, hypothyroidism who presents for follow-up.  He was last seen due to chest pain and shortness of breath.  Echocardiogram and Lexiscan Myoview were ordered to evaluate presence of cardiac etiology.  Echo was performed, he declined Myoview due to cost of test.  The test will cost him over $4000.  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  This has limited his mobility in the past.  He is working on getting back to exercising.   Past Medical History:  Diagnosis Date  . Allergy    allergic rhinitis  . GERD (gastroesophageal reflux disease)   . Hypothyroidism   . Peyronie disease     Past Surgical History:  Procedure Laterality Date  . COLONOSCOPY    . INGUINAL HERNIA REPAIR  9/05  . MOHS SURGERY     nasal SCC----[facility]  . TOENAIL EXCISION     partial  . TONSILLECTOMY  1972  . 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 EPINEPHrine 0.3 mg/0.3 mL IJ SOAJ injection Inject 0.3 mLs (0.3 mg total) into the muscle once as needed for up to 1 dose.  . hydrocortisone 2.5 % cream Use rectally twice daily as neeed  . levothyroxine (SYNTHROID) 25 MCG tablet TAKE 1 TABLET (25 MCG TOTAL) BY MOUTH DAILY BEFORE BREAKFAST.  . Magnesium 500 MG CAPS Take by mouth.  . naproxen sodium (ALEVE) 220 MG tablet Take 220 mg by mouth as needed.  Marland Kitchen omeprazole (PRILOSEC) 20 MG capsule Take 1 capsule (20 mg total) by mouth daily.   Marland Kitchen OVER THE COUNTER MEDICATION Take 2 capsules by mouth daily. Synoviox includes MSM, turmeric, and hyaluronic acid  . SOOLANTRA 1 % CREA Apply topically daily. to face  . triamcinolone cream (KENALOG) 0.1 % Uses as directed     Allergies:   Bee venom and Other   Social History   Socioeconomic History  . Marital status: Legally Separated    Spouse name: Not on file  . Number of children: 2  . Years of education: Not on file  . Highest education level: Not on file  Occupational History  . 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 Smokeless tobacco: Never Used  Vaping Use  . Vaping Use: Never used  Substance and Sexual Activity  . Alcohol use: No  . Drug use: No  . Sexual activity: Not on file  Other Topics Concern  . Not on file  Social History Narrative  . Not on file   Social Determinants of Health   Financial Resource Strain: Not on file  Food Insecurity: Not on file  Transportation Needs: Not on file  Physical Activity: Not on file  Stress: Not on file  Social Connections: Not on file  Family History: The patient's family history includes Arthritis in his mother; Breast cancer in his paternal grandmother and another family member; Cancer in his paternal aunt and sister; Celiac disease in his paternal uncle; Coronary artery disease in his maternal grandfather and paternal grandfather; Dementia in his father; Heart attack in his father and mother; Heart disease in his father; Hodgkin's lymphoma in his sister; Other in his paternal uncle and sister; Stroke in his maternal grandmother; Suicidality in his paternal grandmother. There is no history of Diabetes, Hypertension, Colon polyps, or Colon cancer.  ROS:   Please see the history of present illness.     All other systems reviewed and are negative.  EKGs/Labs/Other Studies Reviewed:    The following studies were reviewed  today:   EKG:  EKG not  ordered today.  Recent Labs: 11/23/2019: ALT 21; BUN 16; Creatinine, Ser 1.08; Hemoglobin 15.9; Platelets 272.0; Potassium 4.3; Sodium 138; TSH 1.24  Recent Lipid Panel    Component Value Date/Time   CHOL 166 11/23/2019 0831   TRIG 94.0 11/23/2019 0831   HDL 42.70 11/23/2019 0831   CHOLHDL 4 11/23/2019 0831   VLDL 18.8 11/23/2019 0831   LDLCALC 104 (H) 11/23/2019 0831     Risk Assessment/Calculations:      Physical Exam:    VS:  BP 120/84 (BP Location: Left Arm, Patient Position: Sitting, Cuff Size: Large)   Pulse 81   Ht 5\' 8"  (1.727 m)   Wt 194 lb (88 kg)   SpO2 99%   BMI 29.50 kg/m     Wt Readings from Last 3 Encounters:  02/24/20 194 lb (88 kg)  12/16/19 188 lb 4 oz (85.4 kg)  11/23/19 186 lb (84.4 kg)     GEN:  Well nourished, well developed in no acute distress HEENT: Normal NECK: No JVD; No carotid bruits LYMPHATICS: No lymphadenopathy CARDIAC: RRR, no murmurs, rubs, gallops RESPIRATORY:  Clear to auscultation without rales, wheezing or rhonchi  ABDOMEN: Soft, non-tender, non-distended MUSCULOSKELETAL:  No edema; No deformity  SKIN: Warm and dry NEUROLOGIC:  Alert and oriented x 3 PSYCHIATRIC:  Normal affect   ASSESSMENT:    1. Precordial pain   2. Pure hypercholesterolemia    PLAN:    In order of problems listed above:  1. Patient with symptoms of chest pressure and shortness of breath.  Echocardiogram 12/2019 showed normal systolic and diastolic function, EF 55 to 60%.  Stress test not performed due to cost issues for patient.  Due to chronic back issues and sciatica, will not be able to perform exercise stress testing.  Will obtain a coronary calcium scan for risk prognostication. 2. Hyperlipidemia, 10-year ASCVD risk 9.5%.  Patient is in statin benefit group.  Patient will like to try some exercising with walking, eating healthy which I think is reasonable at this point.  Will review calcium scan as above for additional  recommendations.  Follow up after coronary calcium score.    Shared Decision Making/Informed Consent       Medication Adjustments/Labs and Tests Ordered: Current medicines are reviewed at length with the patient today.  Concerns regarding medicines are outlined above.  Orders Placed This Encounter  Procedures  . CT CARDIAC SCORING (SELF PAY ONLY)   No orders of the defined types were placed in this encounter.   Patient Instructions  Medication Instructions:  Your physician recommends that you continue on your current medications as directed. Please refer to the Current Medication list given to you today.  *If you  need a refill on your cardiac medications before your next appointment, please call your pharmacy*   Lab Work: None Ordered    Testing/Procedures:  We will order CT coronary calcium score  $99 at our [facility] in [place]  Please call Colletta [place] at [phone] to schedule   [place], [place] [address]   Follow-Up: At [facility], you and your health needs are our priority.  As part of our continuing mission to provide you with exceptional heart care, we have created designated Provider Care Teams.  These Care Teams include your primary Cardiologist (physician) and Advanced Practice Providers (APPs -  Physician Assistants and Nurse Practitioners) who all work together to provide you with the care you need, when you need it.  We recommend signing up for the patient portal called "MyChart".  Sign up information is provided on this After Visit Summary.  MyChart is used to connect with patients for Virtual Visits (Telemedicine).  Patients are able to view lab/test results, encounter notes, upcoming appointments, etc.  Non-urgent messages can be sent to your provider as well.   To learn more about what you can do with MyChart, go to [email or web address].    Your next appointment:   1  month(s)  The format for your next appointment:   In Person  Provider:   Kate Sable, MD   Other Instructions      Signed, Kate Sable, MD  02/24/2020 12:45 PM    [place]

## 2020-02-27 ENCOUNTER — Ambulatory Visit
Admission: RE | Admit: 2020-02-27 | Discharge: 2020-02-27 | Disposition: A | Discharge status: Home / Self Care | Payer: PRIVATE HEALTH INSURANCE | Source: Ambulatory Visit | Attending: Cardiology | Admitting: Cardiology

## 2020-02-27 ENCOUNTER — Other Ambulatory Visit: Payer: Self-pay

## 2020-02-27 DIAGNOSIS — R072 Precordial pain: Secondary | ICD-10-CM | POA: Diagnosis not present

## 2020-02-28 ENCOUNTER — Telehealth: Payer: Self-pay

## 2020-02-28 DIAGNOSIS — E785 Hyperlipidemia, unspecified: Secondary | ICD-10-CM

## 2020-02-28 MED ORDER — ASPIRIN EC 81 MG PO TBEC: 81.0000 mg | DELAYED_RELEASE_TABLET | Freq: Every day | ORAL | 3 refills | Status: AC

## 2020-02-28 NOTE — Telephone Encounter (Signed)
Called patient and relayed the below copied and pasted result note from Dr. Garen Lah.  Patient stated that he is agreeable to starting a baby aspirin but that he is not ready to try a statin drug at this time. He stated that he would like to start exercising as he has not been able to for awhile because of his back but now is free to do so. He also stated that he would make some diet changes.    Patient has a calcium score of 112. Recommend starting low-dose/baby aspirin 81 mg daily. Cholesterol levels were elevated, calcifications noted in the coronary arteries. Also recommend patient start a statin, Lipitor 40 mg daily if patient is agreeable. Thank you   I informed him that I would pass on his response to Dr. Garen Lah and inquire about getting a fasting lipid drawn in 3 months.

## 2020-03-01 NOTE — Telephone Encounter (Signed)
Patient agreed that he would get a fasting lipid in 3 months.

## 2020-03-04 ENCOUNTER — Other Ambulatory Visit: Payer: Self-pay | Admitting: Internal Medicine

## 2020-03-06 ENCOUNTER — Telehealth: Payer: Self-pay

## 2020-03-06 NOTE — Telephone Encounter (Signed)
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 Please give patient a call when form has been complete and is ready to be picked up.

## 2020-03-07 NOTE — Telephone Encounter (Signed)
Spoke to pt. He is going to find out if the TB skin test is mandatory. If so, he will call back to schedule. He is UTD on tetanus. He is pretty sure he got the Hep B series when he worked for the city of [place], but cannot get those records. I have the form at my desk in a folder "Waiting on Pt"

## 2020-03-20 ENCOUNTER — Other Ambulatory Visit: Payer: Self-pay

## 2020-03-20 ENCOUNTER — Ambulatory Visit (INDEPENDENT_AMBULATORY_CARE_PROVIDER_SITE_OTHER): Payer: PRIVATE HEALTH INSURANCE

## 2020-03-20 DIAGNOSIS — Z111 Encounter for screening for respiratory tuberculosis: Secondary | ICD-10-CM

## 2020-03-20 NOTE — Progress Notes (Signed)
Per orders of Dr. Viviana Simpler, injection of PPD skin test given by Lurlean Nanny in Left arm  Pt is aware return 2/24 after 3:05pm or 2/25 before 3:05pm  Patient tolerated injection well.

## 2020-03-22 LAB — TB SKIN TEST
Induration: 0 mm
TB Skin Test: NEGATIVE

## 2020-03-23 ENCOUNTER — Other Ambulatory Visit: Payer: Self-pay

## 2020-03-23 ENCOUNTER — Encounter: Payer: Self-pay | Admitting: Cardiology

## 2020-03-23 ENCOUNTER — Ambulatory Visit: Payer: PRIVATE HEALTH INSURANCE | Admitting: Cardiology

## 2020-03-23 VITALS — BP 112/76 | HR 73 | Ht 68.0 in | Wt 189.0 lb

## 2020-03-23 DIAGNOSIS — I2510 Atherosclerotic heart disease of native coronary artery without angina pectoris: Secondary | ICD-10-CM

## 2020-03-23 DIAGNOSIS — R079 Chest pain, unspecified: Secondary | ICD-10-CM | POA: Diagnosis not present

## 2020-03-23 DIAGNOSIS — I2584 Coronary atherosclerosis due to calcified coronary lesion: Secondary | ICD-10-CM

## 2020-03-23 DIAGNOSIS — E7800 Pure hypercholesterolemia, unspecified: Secondary | ICD-10-CM | POA: Diagnosis not present

## 2020-03-23 NOTE — Progress Notes (Signed)
Cardiology Office Note:    Date:  03/23/2020   ID:  [name], DOB 1955/02/04, MRN [id number]  PCP:  [name], MD  [name] Cardiologist:  Kate Sable, MD  [place] Electrophysiologist:  None   Referring MD: [name], MD   Chief Complaint  Patient presents with  . Follow-up    1 month     History of Present Illness:    Willie Griffin is a 65 y.o. male with a hx of hyperlipidemia, GERD, hypothyroidism who presents for follow-up.    He was previously seen due to chest pain and shortness of breath.  Stress test was ordered but patient did not perform due to high cost.  Not able to exercise due to chronic back pain and sciatica.  Calcium score was ordered for risk prognostication.  He states feeling much better since last visit.  Denies any chest pain or shortness of breath.  He has started walking, also is committed to eating low-cholesterol diet.  Prior notes Echocardiogram 12/2019 showed normal systolic and diastolic function.    Past Medical History:  Diagnosis Date  . Allergy    allergic rhinitis  . GERD (gastroesophageal reflux disease)   . Hypothyroidism   . Peyronie disease     Past Surgical History:  Procedure Laterality Date  . COLONOSCOPY    . INGUINAL HERNIA REPAIR  9/05  . MOHS SURGERY     nasal SCC----[facility]  . TOENAIL EXCISION     partial  . TONSILLECTOMY  1972  . 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 aspirin EC 81 MG tablet Take 1 tablet (81 mg total) by mouth daily. Swallow whole.  . Cyanocobalamin (VITAMIN B-12 PO) Take 2 Doses by mouth daily.  Marland Kitchen EPINEPHrine 0.3 mg/0.3 mL IJ SOAJ injection Inject 0.3 mLs (0.3 mg total) into the muscle once as needed for up to 1 dose.  . hydrocortisone 2.5 % cream Use rectally twice daily as neeed  . levothyroxine (SYNTHROID) 25 MCG tablet TAKE 1 TABLET (25 MCG TOTAL) BY MOUTH DAILY BEFORE BREAKFAST.  . Magnesium 500 MG CAPS  Take by mouth.  . naproxen sodium (ALEVE) 220 MG tablet Take 220 mg by mouth as needed.  Marland Kitchen omeprazole (PRILOSEC) 20 MG capsule Take 1 capsule (20 mg total) by mouth daily.  Marland Kitchen OVER THE COUNTER MEDICATION Take 2 capsules by mouth daily. Synoviox includes MSM, turmeric, and hyaluronic acid  . SOOLANTRA 1 % CREA Apply topically daily. to face  . triamcinolone cream (KENALOG) 0.1 % Uses as directed     Allergies:   Bee venom and Other   Social History   Socioeconomic History  . Marital status: Legally Separated    Spouse name: Not on file  . Number of children: 2  . Years of education: Not on file  . Highest education level: Not on file  Occupational History  . 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 Smokeless tobacco: Never Used  Vaping Use  . Vaping Use: Never used  Substance and Sexual Activity  . Alcohol use: No  . Drug use: No  . Sexual activity: Not on file  Other Topics Concern  . Not on file  Social History Narrative  . Not on file   Social Determinants of Health   Financial Resource Strain: Not on  file  Food Insecurity: Not on file  Transportation Needs: Not on file  Physical Activity: Not on file  Stress: Not on file  Social Connections: Not on file     Family History: The patient's family history includes Arthritis in his mother; Breast cancer in his paternal grandmother and another family member; Cancer in his paternal aunt and sister; Celiac disease in his paternal uncle; Coronary artery disease in his maternal grandfather and paternal grandfather; Dementia in his father; Heart attack in his father and mother; Heart disease in his father; Hodgkin's lymphoma in his sister; Other in his paternal uncle and sister; Stroke in his maternal grandmother; Suicidality in his paternal grandmother. There is no history of Diabetes, Hypertension, Colon polyps, or Colon cancer.  ROS:   Please see  the history of present illness.     All other systems reviewed and are negative.  EKGs/Labs/Other Studies Reviewed:    The following studies were reviewed today:   EKG:  EKG not  ordered today.  Recent Labs: 11/23/2019: ALT 21; BUN 16; Creatinine, Ser 1.08; Hemoglobin 15.9; Platelets 272.0; Potassium 4.3; Sodium 138; TSH 1.24  Recent Lipid Panel    Component Value Date/Time   CHOL 166 11/23/2019 0831   TRIG 94.0 11/23/2019 0831   HDL 42.70 11/23/2019 0831   CHOLHDL 4 11/23/2019 0831   VLDL 18.8 11/23/2019 0831   LDLCALC 104 (H) 11/23/2019 0831     Risk Assessment/Calculations:      Physical Exam:    VS:  BP 112/76   Pulse 73   Ht 5\' 8"  (1.727 m)   Wt 189 lb (85.7 kg)   BMI 28.74 kg/m     Wt Readings from Last 3 Encounters:  03/23/20 189 lb (85.7 kg)  02/24/20 194 lb (88 kg)  12/16/19 188 lb 4 oz (85.4 kg)     GEN:  Well nourished, well developed in no acute distress HEENT: Normal NECK: No JVD; No carotid bruits LYMPHATICS: No lymphadenopathy CARDIAC: RRR, no murmurs, rubs, gallops RESPIRATORY:  Clear to auscultation without rales, wheezing or rhonchi  ABDOMEN: Soft, non-tender, non-distended MUSCULOSKELETAL:  No edema; No deformity  SKIN: Warm and dry NEUROLOGIC:  Alert and oriented x 3 PSYCHIATRIC:  Normal affect   ASSESSMENT:    1. Chest pain of uncertain etiology   2. Coronary artery calcification   3. Pure hypercholesterolemia    PLAN:    In order of problems listed above:  1. History of chest pain, risk factors hyperlipidemia..  Echocardiogram 12/2019 showed normal systolic and diastolic function, EF 55 to 60%.  Stress test not performed due to cost issues for patient.  He states he feels better, denies any recurrent chest discomfort. 2. Coronary calcium scan showed a calcium score of 112, involving all 3 arteries.  Aspirin 81 mg daily and statin recommended.  Patient okay with taking aspirin, will like to hold off on taking cholesterol for  now. 3. Hyperlipidemia, 10-year ASCVD risk 9.5%.  Patient is in statin benefit group.  Also recommended statin in light of elevated coronary calcium score.  Patient will try exercising, low-cholesterol diet.  Plan for repeat fasting lipid profile in 6 months.  Follow-up in 6 months.    Shared Decision Making/Informed Consent       Medication Adjustments/Labs and Tests Ordered: Current medicines are reviewed at length with the patient today.  Concerns regarding medicines are outlined above.  Orders Placed This Encounter  Procedures  . Lipid panel   No orders of the defined  types were placed in this encounter.   Patient Instructions   Medication Instructions:  Your physician recommends that you continue on your current medications as directed. Please refer to the Current Medication list given to you today.  *If you need a refill on your cardiac medications before your next appointment, please call your pharmacy*   Lab Work:  Your physician recommends that you return for a FASTING lipid profile: just prior to your next follow up in 6 months.  - You will need to be fasting. Please do not have anything to eat or drink after midnight the morning you have the lab work. You may only have water or black coffee with no cream or sugar. - Please go to the [facility]. You will check in at the front desk to the right as you walk into the atrium. Valet Parking is offered if needed. - No appointment needed. You may go any day between 7 am and 6 pm.   Testing/Procedures: None ordered   Follow-Up: At [facility], you and your health needs are our priority.  As part of our continuing mission to provide you with exceptional heart care, we have created designated Provider Care Teams.  These Care Teams include your primary Cardiologist (physician) and Advanced Practice Providers (APPs -  Physician Assistants and Nurse Practitioners) who all work together to provide you with the care you  need, when you need it.  We recommend signing up for the patient portal called "MyChart".  Sign up information is provided on this After Visit Summary.  MyChart is used to connect with patients for Virtual Visits (Telemedicine).  Patients are able to view lab/test results, encounter notes, upcoming appointments, etc.  Non-urgent messages can be sent to your provider as well.   To learn more about what you can do with MyChart, go to [email or web address].    Your next appointment:   6 month(s)  The format for your next appointment:   In Person  Provider:   Kate Sable, MD   Other Instructions    Preventing High Cholesterol Cholesterol is a white, waxy substance similar to fat that the human body needs to help build cells. The liver makes all the cholesterol that a person's body needs. Having high cholesterol (hypercholesterolemia) increases your risk for heart disease and stroke. Extra or excess cholesterol comes from the food that you eat. High cholesterol can often be prevented with diet and lifestyle changes. If you already have high cholesterol, you can control it with diet, lifestyle changes, and medicines. How can high cholesterol affect me? If you have high cholesterol, fatty deposits (plaques) may build up on the walls of your blood vessels. The blood vessels that carry blood away from your heart are called arteries. Plaques make the arteries narrower and stiffer. This in turn can:  Restrict or block blood flow and cause blood clots to form.  Increase your risk for heart attack and stroke. What can increase my risk for high cholesterol? This condition is more likely to develop in people who:  Eat foods that are high in saturated fat or cholesterol. Saturated fat is mostly found in foods that come from animal sources.  Are overweight.  Are not getting enough exercise.  Have a family history of high cholesterol (familial hypercholesterolemia). What actions can I  take to prevent this? Nutrition  Eat less saturated fat.  Avoid trans fats (partially hydrogenated oils). These are often found in margarine and in some baked goods, fried foods, and  snacks bought in packages.  Avoid precooked or cured meat, such as bacon, sausages, or meat loaves.  Avoid foods and drinks that have added sugars.  Eat more fruits, vegetables, and whole grains.  Choose healthy sources of protein, such as fish, poultry, lean cuts of red meat, beans, peas, lentils, and nuts.  Choose healthy sources of fat, such as: ? Nuts. ? Vegetable oils, especially olive oil. ? Fish that have healthy fats, such as omega-3 fatty acids. These fish include mackerel or salmon.   Lifestyle  Lose weight if you are overweight. Maintaining a healthy body mass index (BMI) can help prevent or control high cholesterol. It can also lower your risk for diabetes and high blood pressure. Ask your health care provider to help you with a diet and exercise plan to lose weight safely.  Do not use any products that contain nicotine or tobacco, such as cigarettes, e-cigarettes, and chewing tobacco. If you need help quitting, ask your health care provider. Alcohol use  Do not drink alcohol if: ? Your health care provider tells you not to drink. ? You are pregnant, may be pregnant, or are planning to become pregnant.  If you drink alcohol: ? Limit how much you use to:  0-1 drink a day for women.  0-2 drinks a day for men. ? Be aware of how much alcohol is in your drink. In the [place] one drink equals one 12 oz bottle of beer (355 mL), one 5 oz glass of wine (148 mL), or one 1 oz glass of hard liquor (44 mL). Activity  Get enough exercise. Do exercises as told by your health care provider.  Each week, do at least 150 minutes of exercise that takes a medium level of effort (moderate-intensity exercise). This kind of exercise: ? Makes your heart beat faster while allowing you to still be able to  talk. ? Can be done in short sessions several times a day or longer sessions a few times a week. For example, on 5 days each week, you could walk fast or ride your bike 3 times a day for 10 minutes each time.   Medicines  Your health care provider may recommend medicines to help lower cholesterol. This may be a medicine to lower the amount of cholesterol that your liver makes. You may need medicine if: ? Diet and lifestyle changes have not lowered your cholesterol enough. ? You have high cholesterol and other risk factors for heart disease or stroke.  Take over-the-counter and prescription medicines only as told by your health care provider. General information  Manage your risk factors for high cholesterol. Talk with your health care provider about all your risk factors and how to lower your risk.  Manage other conditions that you have, such as diabetes or high blood pressure (hypertension).  Have blood tests to check your cholesterol levels at regular points in time as told by your health care provider.  Keep all follow-up visits as told by your health care provider. This is important. Where to find more information  American Heart Association: www.heart.org  National Heart, Lung, and Blood Institute: https://wilson-eaton.com/ Summary  High cholesterol increases your risk for heart disease and stroke. By keeping your cholesterol level low, you can reduce your risk for these conditions.  High cholesterol can often be prevented with diet and lifestyle changes.  Work with your health care provider to manage your risk factors, and have your blood tested regularly. This information is not intended to replace advice given  to you by your health care provider. Make sure you discuss any questions you have with your health care provider. Document Revised: 10/26/2018 Document Reviewed: 10/26/2018 Elsevier Patient Education  2021 [place].      Signed, Kate Sable, MD  03/23/2020 12:27  PM    [place]

## 2020-03-23 NOTE — Patient Instructions (Signed)
Medication Instructions:  Your physician recommends that you continue on your current medications as directed. Please refer to the Current Medication list given to you today.  *If you need a refill on your cardiac medications before your next appointment, please call your pharmacy*   Lab Work:  Your physician recommends that you return for a FASTING lipid profile: just prior to your next follow up in 6 months.  - You will need to be fasting. Please do not have anything to eat or drink after midnight the morning you have the lab work. You may only have water or black coffee with no cream or sugar. - Please go to the [facility]. You will check in at the front desk to the right as you walk into the atrium. Valet Parking is offered if needed. - No appointment needed. You may go any day between 7 am and 6 pm.   Testing/Procedures: None ordered   Follow-Up: At [facility], you and your health needs are our priority.  As part of our continuing mission to provide you with exceptional heart care, we have created designated Provider Care Teams.  These Care Teams include your primary Cardiologist (physician) and Advanced Practice Providers (APPs -  Physician Assistants and Nurse Practitioners) who all work together to provide you with the care you need, when you need it.  We recommend signing up for the patient portal called "MyChart".  Sign up information is provided on this After Visit Summary.  MyChart is used to connect with patients for Virtual Visits (Telemedicine).  Patients are able to view lab/test results, encounter notes, upcoming appointments, etc.  Non-urgent messages can be sent to your provider as well.   To learn more about what you can do with MyChart, go to [email or web address].    Your next appointment:   6 month(s)  The format for your next appointment:   In Person  Provider:   Kate Sable, MD   Other Instructions    Preventing High  Cholesterol Cholesterol is a white, waxy substance similar to fat that the human body needs to help build cells. The liver makes all the cholesterol that a person's body needs. Having high cholesterol (hypercholesterolemia) increases your risk for heart disease and stroke. Extra or excess cholesterol comes from the food that you eat. High cholesterol can often be prevented with diet and lifestyle changes. If you already have high cholesterol, you can control it with diet, lifestyle changes, and medicines. How can high cholesterol affect me? If you have high cholesterol, fatty deposits (plaques) may build up on the walls of your blood vessels. The blood vessels that carry blood away from your heart are called arteries. Plaques make the arteries narrower and stiffer. This in turn can:  Restrict or block blood flow and cause blood clots to form.  Increase your risk for heart attack and stroke. What can increase my risk for high cholesterol? This condition is more likely to develop in people who:  Eat foods that are high in saturated fat or cholesterol. Saturated fat is mostly found in foods that come from animal sources.  Are overweight.  Are not getting enough exercise.  Have a family history of high cholesterol (familial hypercholesterolemia). What actions can I take to prevent this? Nutrition  Eat less saturated fat.  Avoid trans fats (partially hydrogenated oils). These are often found in margarine and in some baked goods, fried foods, and snacks bought in packages.  Avoid precooked or cured meat, such as bacon,  sausages, or meat loaves.  Avoid foods and drinks that have added sugars.  Eat more fruits, vegetables, and whole grains.  Choose healthy sources of protein, such as fish, poultry, lean cuts of red meat, beans, peas, lentils, and nuts.  Choose healthy sources of fat, such as: ? Nuts. ? Vegetable oils, especially olive oil. ? Fish that have healthy fats, such as omega-3  fatty acids. These fish include mackerel or salmon.   Lifestyle  Lose weight if you are overweight. Maintaining a healthy body mass index (BMI) can help prevent or control high cholesterol. It can also lower your risk for diabetes and high blood pressure. Ask your health care provider to help you with a diet and exercise plan to lose weight safely.  Do not use any products that contain nicotine or tobacco, such as cigarettes, e-cigarettes, and chewing tobacco. If you need help quitting, ask your health care provider. Alcohol use  Do not drink alcohol if: ? Your health care provider tells you not to drink. ? You are pregnant, may be pregnant, or are planning to become pregnant.  If you drink alcohol: ? Limit how much you use to:  0-1 drink a day for women.  0-2 drinks a day for men. ? Be aware of how much alcohol is in your drink. In the [place] one drink equals one 12 oz bottle of beer (355 mL), one 5 oz glass of wine (148 mL), or one 1 oz glass of hard liquor (44 mL). Activity  Get enough exercise. Do exercises as told by your health care provider.  Each week, do at least 150 minutes of exercise that takes a medium level of effort (moderate-intensity exercise). This kind of exercise: ? Makes your heart beat faster while allowing you to still be able to talk. ? Can be done in short sessions several times a day or longer sessions a few times a week. For example, on 5 days each week, you could walk fast or ride your bike 3 times a day for 10 minutes each time.   Medicines  Your health care provider may recommend medicines to help lower cholesterol. This may be a medicine to lower the amount of cholesterol that your liver makes. You may need medicine if: ? Diet and lifestyle changes have not lowered your cholesterol enough. ? You have high cholesterol and other risk factors for heart disease or stroke.  Take over-the-counter and prescription medicines only as told by your health care  provider. General information  Manage your risk factors for high cholesterol. Talk with your health care provider about all your risk factors and how to lower your risk.  Manage other conditions that you have, such as diabetes or high blood pressure (hypertension).  Have blood tests to check your cholesterol levels at regular points in time as told by your health care provider.  Keep all follow-up visits as told by your health care provider. This is important. Where to find more information  American Heart Association: www.heart.org  National Heart, Lung, and Blood Institute: https://wilson-eaton.com/ Summary  High cholesterol increases your risk for heart disease and stroke. By keeping your cholesterol level low, you can reduce your risk for these conditions.  High cholesterol can often be prevented with diet and lifestyle changes.  Work with your health care provider to manage your risk factors, and have your blood tested regularly. This information is not intended to replace advice given to you by your health care provider. Make sure you discuss any  questions you have with your health care provider. Document Revised: 10/26/2018 Document Reviewed: 10/26/2018 Elsevier Patient Education  [place].

## 2020-08-06 ENCOUNTER — Telehealth: Payer: Self-pay | Admitting: Internal Medicine

## 2020-08-06 NOTE — Telephone Encounter (Signed)
Did he have a new pharmacy? Did you put the new info in Epic in case something comes up?

## 2020-08-06 NOTE — Telephone Encounter (Signed)
Patient call in stated that he has new [employer] . Did not want  any Issues getting his medication

## 2020-08-07 NOTE — Telephone Encounter (Signed)
Patient did not change his Pharmacy. Insurance has been updated in [employer]

## 2020-09-12 ENCOUNTER — Other Ambulatory Visit
Admission: RE | Admit: 2020-09-12 | Discharge: 2020-09-12 | Disposition: A | Discharge status: Home / Self Care | Payer: BC Managed Care – PPO | Attending: Cardiology | Admitting: Cardiology

## 2020-09-12 DIAGNOSIS — E7800 Pure hypercholesterolemia, unspecified: Secondary | ICD-10-CM | POA: Diagnosis not present

## 2020-09-12 LAB — LIPID PANEL
Cholesterol: 160 mg/dL (ref 0–200)
HDL: 37 mg/dL — ABNORMAL LOW (ref >40)
LDL Cholesterol: 104 mg/dL — ABNORMAL HIGH (ref 0–99)
Total CHOL/HDL Ratio: 4.3 RATIO
Triglycerides: 96 mg/dL (ref <150)
VLDL: 19 mg/dL (ref 0–40)

## 2020-09-13 ENCOUNTER — Telehealth: Payer: Self-pay

## 2020-09-13 NOTE — Telephone Encounter (Signed)
-----   Message from Kate Sable, MD sent at 09/12/2020  5:34 PM EDT ----- LDL cholesterol elevated, similar to prior.  Recommend statin/Lipitor 40 mg daily if patient is agreeable.  Continue low-cholesterol diet and exercise.

## 2020-09-13 NOTE — Telephone Encounter (Signed)
Called and spoke with patient and gave him the following result note:  LDL cholesterol elevated, similar to prior.  Recommend statin/Lipitor 40 mg daily if patient is agreeable.  Continue low-cholesterol diet and exercise.  Patient LDL was 104, he is unwilling to start a statin at this time. Would like to give more effort to diet and exercise before starting a statin. He would like to cancel follow up at this time (which was primarily to review cholesterol).  Will route to [facility] for new recommended follow up.

## 2020-09-13 NOTE — Telephone Encounter (Signed)
Sent the following response to patient through [place] and cancelled follow up appointment.  Kate Sable, MD  Kavin Leech, RN Caller: Unspecified (Today,  3:48 PM)   Noted.  Have patient follow-up as needed.  Okay to cancel follow-up appointment as per patient request.

## 2020-09-17 ENCOUNTER — Other Ambulatory Visit: Payer: Self-pay

## 2020-09-17 NOTE — Progress Notes (Signed)
Opened in error

## 2020-09-21 ENCOUNTER — Ambulatory Visit: Payer: BC Managed Care – PPO | Admitting: Cardiology

## 2020-11-23 ENCOUNTER — Ambulatory Visit (INDEPENDENT_AMBULATORY_CARE_PROVIDER_SITE_OTHER): Payer: BC Managed Care – PPO | Admitting: Internal Medicine

## 2020-11-23 ENCOUNTER — Encounter: Payer: Self-pay | Admitting: Internal Medicine

## 2020-11-23 ENCOUNTER — Other Ambulatory Visit: Payer: Self-pay

## 2020-11-23 VITALS — BP 120/84 | HR 59 | Temp 98.0°F | Ht 67.5 in | Wt 178.0 lb

## 2020-11-23 DIAGNOSIS — X32XXXA Exposure to sunlight, initial encounter: Secondary | ICD-10-CM | POA: Diagnosis not present

## 2020-11-23 DIAGNOSIS — E039 Hypothyroidism, unspecified: Secondary | ICD-10-CM | POA: Diagnosis not present

## 2020-11-23 DIAGNOSIS — I2510 Atherosclerotic heart disease of native coronary artery without angina pectoris: Secondary | ICD-10-CM | POA: Insufficient documentation

## 2020-11-23 DIAGNOSIS — L718 Other rosacea: Secondary | ICD-10-CM | POA: Diagnosis not present

## 2020-11-23 DIAGNOSIS — K625 Hemorrhage of anus and rectum: Secondary | ICD-10-CM | POA: Diagnosis not present

## 2020-11-23 DIAGNOSIS — I2584 Coronary atherosclerosis due to calcified coronary lesion: Secondary | ICD-10-CM

## 2020-11-23 DIAGNOSIS — Z125 Encounter for screening for malignant neoplasm of prostate: Secondary | ICD-10-CM | POA: Diagnosis not present

## 2020-11-23 DIAGNOSIS — K219 Gastro-esophageal reflux disease without esophagitis: Secondary | ICD-10-CM | POA: Diagnosis not present

## 2020-11-23 DIAGNOSIS — Z0000 Encounter for general adult medical examination without abnormal findings: Secondary | ICD-10-CM | POA: Diagnosis not present

## 2020-11-23 DIAGNOSIS — Z85828 Personal history of other malignant neoplasm of skin: Secondary | ICD-10-CM | POA: Diagnosis not present

## 2020-11-23 DIAGNOSIS — L821 Other seborrheic keratosis: Secondary | ICD-10-CM | POA: Diagnosis not present

## 2020-11-23 LAB — T4, FREE: Free T4: 0.8 ng/dL (ref 0.60–1.60)

## 2020-11-23 LAB — COMPREHENSIVE METABOLIC PANEL
ALT: 15 U/L (ref 0–53)
AST: 16 U/L (ref 0–37)
Albumin: 4.2 g/dL (ref 3.5–5.2)
Alkaline Phosphatase: 60 U/L (ref 39–117)
BUN: 20 mg/dL (ref 6–23)
CO2: 29 mEq/L (ref 19–32)
Calcium: 9 mg/dL (ref 8.4–10.5)
Chloride: 105 mEq/L (ref 96–112)
Creatinine, Ser: 1.06 mg/dL (ref 0.40–1.50)
GFR: 74.56 mL/min (ref >60.00)
Glucose, Bld: 93 mg/dL (ref 70–99)
Potassium: 4 mEq/L (ref 3.5–5.1)
Sodium: 141 mEq/L (ref 135–145)
Total Bilirubin: 0.7 mg/dL (ref 0.2–1.2)
Total Protein: 6.4 g/dL (ref 6.0–8.3)

## 2020-11-23 LAB — CBC
HCT: 43.1 % (ref 39.0–52.0)
Hemoglobin: 14.6 g/dL (ref 13.0–17.0)
MCHC: 33.8 g/dL (ref 30.0–36.0)
MCV: 90.1 fl (ref 78.0–100.0)
Platelets: 262 10*3/uL (ref 150.0–400.0)
RBC: 4.78 Mil/uL (ref 4.22–5.81)
RDW: 12.4 % (ref 11.5–15.5)
WBC: 6.2 10*3/uL (ref 4.0–10.5)

## 2020-11-23 LAB — LIPID PANEL
Cholesterol: 146 mg/dL (ref 0–200)
HDL: 39.9 mg/dL (ref >39.00)
LDL Cholesterol: 87 mg/dL (ref 0–99)
NonHDL: 105.64
Total CHOL/HDL Ratio: 4
Triglycerides: 91 mg/dL (ref 0.0–149.0)
VLDL: 18.2 mg/dL (ref 0.0–40.0)

## 2020-11-23 LAB — PSA: PSA: 2.5 ng/mL (ref 0.10–4.00)

## 2020-11-23 LAB — TSH: TSH: 1.37 u[IU]/mL (ref 0.35–5.50)

## 2020-11-23 MED ORDER — HYDROCORTISONE 2.5 % EX CREA: TOPICAL_CREAM | CUTANEOUS | 5 refills | Status: DC

## 2020-11-23 NOTE — Assessment & Plan Note (Signed)
Quiet on every other day omeprazole

## 2020-11-23 NOTE — Progress Notes (Signed)
Subjective:    Patient ID: Willie Griffin, male    DOB: Mar 08, 1955, 65 y.o.   MRN: [id number]  HPI Here for physical This visit occurred during the SARS-CoV-2 public health emergency.  Safety protocols were in place, including screening questions prior to the visit, additional usage of staff PPE, and extensive cleaning of exam room while observing appropriate contact time as indicated for disinfecting solutions.   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 Swallow whole. 90 tablet 3   Cyanocobalamin (VITAMIN B-12 PO) Take 2 Doses by mouth daily.     EPINEPHrine 0.3 mg/0.3 mL IJ SOAJ injection Inject 0.3 mLs (0.3 mg total) into the muscle once as needed for up to 1 dose. 2 each 1   hydrocortisone 2.5 % cream Use rectally twice daily as neeed 30 g 5   levothyroxine (SYNTHROID) 25 MCG tablet TAKE 1 TABLET (25 MCG TOTAL) BY MOUTH DAILY BEFORE BREAKFAST. 90 tablet 2   Magnesium 500 MG CAPS Take by mouth.     naproxen sodium (ALEVE) 220 MG tablet Take 220 mg by mouth as needed.     omeprazole (PRILOSEC) 20 MG capsule Take 1 capsule (20 mg total) by mouth daily. 90 capsule 3   OVER THE COUNTER MEDICATION Take 2 capsules by mouth daily. Synoviox includes MSM, turmeric, and hyaluronic acid     SOOLANTRA 1 % CREA Apply topically daily. to face  5   triamcinolone cream (KENALOG) 0.1 % Uses as directed 30 g 2   No current facility-administered medications on file prior to visit.    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      Family History  Problem Relation Age of Onset   Heart attack Father    Dementia Father        Lewy body dementia   Heart disease Father        stent x 11    Arthritis Mother    Heart attack Mother    Coronary artery disease Paternal Grandfather    Coronary artery disease Maternal Grandfather    Suicidality Paternal Grandmother    Breast cancer Paternal Grandmother    Stroke Maternal Grandmother    Hodgkin's lymphoma Sister    Cancer Sister    Other Sister        precancerous polyps   Cancer Paternal Aunt        jaw ca   Celiac disease Paternal Uncle    Other Paternal Uncle        mesothelioma   Breast cancer Other  Diabetes Neg Hx    Hypertension Neg Hx    Colon polyps Neg Hx    Colon cancer Neg Hx     Social History   Socioeconomic History   Marital status: Legally Separated    Spouse name: Not on file   Number of children: 2   Years of education: Not on file   Highest education level: Not on file  Occupational History   Occupation: Librarian, 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       Wears seat belt  HENT:  Negative for dental problem, hearing loss, tinnitus and trouble swallowing.        Keeps up with the dentist  Eyes:  Negative for visual disturbance.       No diplopia or unilateral vision loss  Respiratory:  Negative for chest tightness and shortness of breath.        Chronic cough--?clearing throat  Cardiovascular:  Negative for chest pain and palpitations.       Mild calf swelling at the end of the day--gone in AM  Gastrointestinal:  Positive for blood in stool.       No heartburn on every other day omeprazole Bowels not regular since retiring---slow, then loose or hard  Endocrine: Negative for polydipsia and polyuria.  Genitourinary:  Negative for difficulty urinating and urgency.       No sex currently  Musculoskeletal:  Positive for back pain. Negative for arthralgias and joint swelling.       Uses tumeric for back pain---rare aleve  Skin:  Negative for rash.  Allergic/Immunologic: Positive for environmental allergies. Negative for immunocompromised state.       Occ zyrtec  Neurological:  Negative for dizziness, syncope, light-headedness and headaches.  Hematological:  Negative for adenopathy. Bruises/bleeds easily.  Psychiatric/Behavioral:  Negative for dysphoric mood and sleep disturbance. The patient is not nervous/anxious.       Objective:   Physical Exam Constitutional:      Appearance: Normal appearance.  HENT:     Right Ear: Tympanic membrane and ear canal normal.     Left Ear: Tympanic membrane and ear canal normal.     Mouth/Throat:     Pharynx: No oropharyngeal exudate or posterior oropharyngeal erythema.  Eyes:     Conjunctiva/sclera: Conjunctivae normal.     Pupils: Pupils are equal, round, and reactive to light.  Cardiovascular:     Rate and Rhythm: Normal rate and regular rhythm.     Pulses: Normal pulses.     Heart sounds:  No murmur heard.   No gallop.  Pulmonary:     Effort: Pulmonary effort is normal.     Breath sounds: Normal breath sounds. No wheezing or rales.  Abdominal:     Palpations:  Abdomen is soft.     Tenderness: There is no abdominal tenderness.  Genitourinary:    Comments: Small non inflamed hemorrhoid and perirectal redness Musculoskeletal:     Cervical back: Neck supple.     Right lower leg: No edema.     Left lower leg: No edema.  Lymphadenopathy:     Cervical: No cervical adenopathy.  Skin:    Findings: No lesion or rash.  Neurological:     General: No focal deficit present.     Mental Status: He is alert and oriented to person, place, and time.  Psychiatric:        Mood and Affect: Mood normal.        Behavior: Behavior normal.           Assessment & Plan:

## 2020-11-23 NOTE — Assessment & Plan Note (Signed)
Seems euthyroid Will recheck labs

## 2020-11-23 NOTE — Assessment & Plan Note (Signed)
Rare and looks like it comes from external source Will check FIT---if positive, has anemia, or bleeding persists----will send to GI

## 2020-11-23 NOTE — Assessment & Plan Note (Signed)
Generally healthy Colon due 2029 unless ongoing bleeding Will check PSA again at his request Flu vaccine today Recommended bivalent COVID--he is unsure Discussed exercise

## 2020-11-23 NOTE — Assessment & Plan Note (Signed)
Intermediate score Is taking aspirin but reluctant about statin

## 2020-12-02 ENCOUNTER — Other Ambulatory Visit: Payer: Self-pay | Admitting: Internal Medicine

## 2020-12-04 ENCOUNTER — Telehealth: Payer: Self-pay | Admitting: Radiology

## 2020-12-04 ENCOUNTER — Other Ambulatory Visit (INDEPENDENT_AMBULATORY_CARE_PROVIDER_SITE_OTHER): Payer: BC Managed Care – PPO

## 2020-12-04 DIAGNOSIS — K625 Hemorrhage of anus and rectum: Secondary | ICD-10-CM | POA: Diagnosis not present

## 2020-12-04 LAB — FECAL OCCULT BLOOD, IMMUNOCHEMICAL: Fecal Occult Bld: POSITIVE — AB

## 2020-12-04 NOTE — Telephone Encounter (Signed)
Result released to him Will work on getting him set up for colonoscopy

## 2020-12-04 NOTE — Telephone Encounter (Signed)
[facility] called a positive ifob, results called to Dr Silvio Pate

## 2020-12-06 ENCOUNTER — Other Ambulatory Visit: Payer: Self-pay | Admitting: Internal Medicine

## 2020-12-06 DIAGNOSIS — R195 Other fecal abnormalities: Secondary | ICD-10-CM

## 2020-12-06 NOTE — Progress Notes (Signed)
Order placed

## 2020-12-11 ENCOUNTER — Encounter: Payer: Self-pay | Admitting: Nurse Practitioner

## 2020-12-11 NOTE — Telephone Encounter (Signed)
Pt has been scheduled for 12/31/20

## 2020-12-11 NOTE — Telephone Encounter (Signed)
Pt called checking on status of his colonoscopy.

## 2020-12-14 ENCOUNTER — Other Ambulatory Visit: Payer: Self-pay | Admitting: Internal Medicine

## 2020-12-15 DIAGNOSIS — Z20822 Contact with and (suspected) exposure to covid-19: Secondary | ICD-10-CM | POA: Diagnosis not present

## 2020-12-15 DIAGNOSIS — J09X1 Influenza due to identified novel influenza A virus with pneumonia: Secondary | ICD-10-CM | POA: Diagnosis not present

## 2020-12-15 DIAGNOSIS — R059 Cough, unspecified: Secondary | ICD-10-CM | POA: Diagnosis not present

## 2020-12-30 NOTE — Progress Notes (Signed)
12/30/2020 Follansbee [id number] 08/24/1955   CHIEF COMPLAINT: Positive FOBT  HISTORY OF PRESENT ILLNESS:  Khamarion Bjelland. Klann is a 65 year old male with a past medical history of hypothyroidism, hyperlipidemia, coronary artery calcifications, pneumonia 11/2020 and GERD. He presents to our office today as referred by Dr. Silvio Pate for further evaluation regarding rectal bleeding. He reports having 4 episodes over the past year when he passes a large amount of soft stool with a moderate amount of bright red blood. No associated abdominal or anorectal pain. No straining. The last episode or rectal bleeding occurred early Nov. 2022. At that time, he passed a soft brown BM x 4 to 5 episodes in the am, and on the last episode passed a moderate amount of bright red blood in the toilet and blood on the toilet tissue. He was seen by his PCP one week later and a FOBT was positive.  He reported using Hydrocortisone daily for at least the past 2 years for hemorrhoid management. His most recent colonoscopy by Dr. Ardis Hughs was 07/06/2017 which showed diverticulosis in the left colon and small internal hemorrhoids, no polyps.  No known family history of colorectal cancer.  Labs 11/23/2020 showed a hemoglobin level of 14.6.  He reported being diagnosed with the flu and pneumonia per urgent care 2 weeks ago for which he was prescribed amoxicillin for 7 days per urgent care provider.  His cough has significantly improved.   CBC Latest Ref Rng & Units 11/23/2020 11/23/2019 10/27/2018  WBC 4.0 - 10.5 K/uL 6.2 6.4 6.5  Hemoglobin 13.0 - 17.0 g/dL 14.6 15.9 15.5  Hematocrit 39.0 - 52.0 % 43.1 46.0 44.7  Platelets 150.0 - 400.0 K/uL 262.0 272.0 282.0    CMP Latest Ref Rng & Units 11/23/2020 11/23/2019 10/27/2018  Glucose 70 - 99 mg/dL 93 105(H) 104(H)  BUN 6 - 23 mg/dL 20 16 22   Creatinine 0.40 - 1.50 mg/dL 1.06 1.08 1.10  Sodium 135 - 145 mEq/L 141 138 140  Potassium 3.5 - 5.1 mEq/L 4.0 4.3 4.5  Chloride 96 - 112  mEq/L 105 102 104  CO2 19 - 32 mEq/L 29 30 27   Calcium 8.4 - 10.5 mg/dL 9.0 8.9 9.6  Total Protein 6.0 - 8.3 g/dL 6.4 6.1 6.6  Total Bilirubin 0.2 - 1.2 mg/dL 0.7 0.9 0.7  Alkaline Phos 39 - 117 U/L 60 57 59  AST 0 - 37 U/L 16 19 19   ALT 0 - 53 U/L 15 21 20     Colonoscopy 07/06/2017 by Dr. Ardis Hughs: - Diverticulosis in the left colon. - Small internal hemorrhoids. - The examination was otherwise normal on direct and retroflexion views. - No specimens collected.  Colonoscopy 05/12/2007: Few left sided diverticula  No polyps   Echocardiogram 12/2019: showed normal systolic and diastolic function, EF 55 to 60%.   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   Social History:  He is separated. Retired. He has two sons, one is overweight. Nonsmoker. No alcohol use. No drug use.   Family History: family history includes Arthritis in his mother; Breast cancer in his paternal grandmother and another family member; Cancer in his paternal aunt and sister; Celiac disease in his paternal uncle; Coronary artery disease in his maternal grandfather and paternal grandfather; Dementia in his father; Heart attack in his father and mother; Heart disease in his father; Hodgkin's lymphoma in his sister; Other in his paternal uncle and sister; Stroke in his maternal grandmother; Suicidality in his paternal grandmother. Father had 11 coronary stents.    Allergies  Allergen Reactions   Bee Venom Other (See Comments)   Other Other (See Comments)      Outpatient Encounter Medications as of 12/31/2020  Medication Sig   aspirin EC 81 MG tablet Take 1 tablet (81 mg total) by mouth daily. Swallow whole.   Cyanocobalamin (VITAMIN B-12 PO) Take 2 Doses by mouth daily.   EPINEPHrine 0.3 mg/0.3 mL IJ SOAJ injection Inject 0.3 mLs (0.3 mg total) into the muscle once as needed for up to 1 dose.   hydrocortisone 2.5 % cream Use rectally twice daily as neeed   levothyroxine (SYNTHROID) 25 MCG tablet TAKE 1 TABLET BY MOUTH DAILY BEFORE BREAKFAST.   Magnesium 500 MG CAPS Take by mouth.   naproxen sodium (ALEVE) 220 MG tablet Take 220 mg by mouth as needed.   omeprazole (PRILOSEC) 20 MG capsule TAKE 1 CAPSULE BY MOUTH EVERY DAY   OVER THE COUNTER MEDICATION Take 2 capsules by mouth daily. Synoviox includes MSM, turmeric, and hyaluronic acid   SOOLANTRA 1 % CREA Apply topically daily. to face   triamcinolone cream (KENALOG) 0.1 % Uses as directed   No facility-administered encounter medications on file as of 12/31/2020.   REVIEW OF SYSTEMS: Gen: Denies fever, sweats or chills. No weight loss.  CV: + Left leg swelling. Denies CP or palpitations.  Resp: + Cough (improving). No shortness of breath of hemoptysis.  GI: See HPI.  No GERD symptoms. GU : Denies urinary burning, blood in urine, increased urinary frequency or incontinence. MS: Lower back pain.  Derm: Denies rash, itchiness, skin lesions or unhealing ulcers. Psych: Denies depression, anxiety, memory loss, suicidal ideation and confusion. Heme: Denies bruising, bleeding. Neuro:  Denies headaches, dizziness or paresthesias. Endo:  Denies any problems with DM, thyroid or adrenal function.   PHYSICAL EXAM: BP 119/80   Pulse 75   Ht 5\' 8"  (1.727 m)   Wt 181 lb 6.4 oz (82.3 kg)   SpO2 98%   BMI 27.58 kg/m   General: 65 year old male in NAD.  Head: Normocephalic and atraumatic. Eyes:   Sclerae non-icteric, conjunctive pink. Ears: Normal auditory acuity. Mouth: Dentition intact. No ulcers or lesions.  Neck: Supple, no lymphadenopathy or thyromegaly.  Lungs: Clear bilaterally to auscultation without wheezes, crackles or rhonchi. Heart: Regular rate and rhythm. No murmur, rub or gallop appreciated.  Abdomen: Soft, nontender, non distended. No masses. No hepatosplenomegaly. Normoactive bowel sounds x 4 quadrants.  Rectal: No external hemorrhoids or fissure. Internal hemorrhoids, left lateral anal hemorrhoid edematous at the anal verge without bleeding.  Melissa CMA present durign exam.  Musculoskeletal: Symmetrical with no gross deformities. Skin: Warm and dry. No rash or lesions on visible extremities. Extremities: No edema. Neurological: Alert oriented x 4, no focal deficits.  Psychological:  Alert and cooperative. Normal mood  and affect.  ASSESSMENT AND PLAN:  [birth year]) 64 year old male with recurrent rectal bleeding which occurs after passing multiple soft bowel movements in the morning, likely hemorrhoidal etiology, low suspicion for diverticular bleed. Since it has been more than 3 yeas since he underwent a colonoscopy, I recommended a diagnostic colonoscopy to rule out any rectal lesion/mass to explain his painless hematochezia. Stable Hg 14.6. + IFOBT test.  -Colonoscopy benefits and risks discussed including risk with sedation, risk of bleeding, perforation and infection (schedule no earlier than Jan. 2023 to allow more time to recover from the flu and pneumonia) -Benefiber 1 tbsp daily as tolerated  -Stop Hydrocortisone cream  -Apply a small amount of Desitin inside the anal opening and to the external anal area tid as needed for anal or hemorrhoidal irritation/bleeding.  -Use Lactaid with each dairy product  -Consider future hemorrhoid banding, to further discuss after colonoscopy completed -Patient to contact our office if his symptoms worsen  2) Patient reported being  diagnosed with influenza and pneumonia per urgent care treated with Amoxicillin x 7 days a few weeks ago     CC:  [name], MD

## 2020-12-31 ENCOUNTER — Ambulatory Visit: Payer: BC Managed Care – PPO | Admitting: Nurse Practitioner

## 2020-12-31 ENCOUNTER — Encounter: Payer: Self-pay | Admitting: Nurse Practitioner

## 2020-12-31 VITALS — BP 119/80 | HR 75 | Ht 68.0 in | Wt 181.4 lb

## 2020-12-31 DIAGNOSIS — K648 Other hemorrhoids: Secondary | ICD-10-CM

## 2020-12-31 DIAGNOSIS — K625 Hemorrhage of anus and rectum: Secondary | ICD-10-CM | POA: Diagnosis not present

## 2020-12-31 DIAGNOSIS — R194 Change in bowel habit: Secondary | ICD-10-CM

## 2020-12-31 NOTE — Patient Instructions (Addendum)
PROCEDURES: You have been scheduled for a colonoscopy. Please follow the written instructions given to you at your visit today. If you use inhalers (even only as needed), please bring them with you on the day of your procedure.  RECOMMENDATIONS: Desitin: Apply a small amount to the external and internal anal area three times a day as needed. Benefiber- 1 tablespoon daily. Miralax- Dissolve one capful in 8 ounces of water and drink before bed as needed. Stop Hydrocortisone cream.' Take Lactaid with each dairy product.  It was great seeing you today! Thank you for entrusting me with your care and choosing [facility].  Noralyn Pick, CRNP  The [facility] providers would like to encourage you to use [facility] to communicate with providers for non-urgent requests or questions.  Due to long hold times on the telephone, sending your provider a message by [facility] may be faster and more efficient way to get a response. Please allow 48 business hours for a response.  Please remember that this is for non-urgent requests/questions.  If you are age 21 or older, your body mass index should be between 23-30. Your Body mass index is 27.58 kg/m. If this is out of the aforementioned range listed, please consider follow up with your Primary Care Provider.  If you are age 66 or younger, your body mass index should be between 19-25. Your Body mass index is 27.58 kg/m. If this is out of the aformentioned range listed, please consider follow up with your Primary Care Provider.

## 2020-12-31 NOTE — Progress Notes (Signed)
I agree with the above note, plan 

## 2021-01-07 DIAGNOSIS — H60399 Other infective otitis externa, unspecified ear: Secondary | ICD-10-CM | POA: Diagnosis not present

## 2021-03-04 ENCOUNTER — Encounter: Payer: BC Managed Care – PPO | Admitting: Gastroenterology

## 2021-03-19 ENCOUNTER — Other Ambulatory Visit: Payer: Self-pay

## 2021-03-19 ENCOUNTER — Encounter: Payer: Self-pay | Admitting: Cardiology

## 2021-03-19 DIAGNOSIS — E785 Hyperlipidemia, unspecified: Secondary | ICD-10-CM

## 2021-03-26 ENCOUNTER — Ambulatory Visit: Payer: BC Managed Care – PPO | Admitting: Cardiology

## 2021-04-08 ENCOUNTER — Encounter: Payer: Self-pay | Admitting: Internal Medicine

## 2021-04-08 ENCOUNTER — Other Ambulatory Visit
Admission: RE | Admit: 2021-04-08 | Discharge: 2021-04-08 | Disposition: A | Discharge status: Home / Self Care | Payer: BC Managed Care – PPO | Attending: Cardiology | Admitting: Cardiology

## 2021-04-08 DIAGNOSIS — E785 Hyperlipidemia, unspecified: Secondary | ICD-10-CM | POA: Insufficient documentation

## 2021-04-08 LAB — LIPID PANEL
Cholesterol: 151 mg/dL (ref 0–200)
HDL: 38 mg/dL — ABNORMAL LOW (ref >40)
LDL Cholesterol: 94 mg/dL (ref 0–99)
Total CHOL/HDL Ratio: 4 RATIO
Triglycerides: 95 mg/dL (ref <150)
VLDL: 19 mg/dL (ref 0–40)

## 2021-04-18 ENCOUNTER — Ambulatory Visit: Payer: BC Managed Care – PPO | Admitting: Cardiology

## 2021-07-12 DIAGNOSIS — M259 Joint disorder, unspecified: Secondary | ICD-10-CM | POA: Diagnosis not present

## 2021-07-12 DIAGNOSIS — M9905 Segmental and somatic dysfunction of pelvic region: Secondary | ICD-10-CM | POA: Diagnosis not present

## 2021-07-12 DIAGNOSIS — M9901 Segmental and somatic dysfunction of cervical region: Secondary | ICD-10-CM | POA: Diagnosis not present

## 2021-07-12 DIAGNOSIS — M9903 Segmental and somatic dysfunction of lumbar region: Secondary | ICD-10-CM | POA: Diagnosis not present

## 2021-10-23 ENCOUNTER — Encounter: Payer: Self-pay | Admitting: Internal Medicine

## 2021-10-23 MED ORDER — SILDENAFIL CITRATE 100 MG PO TABS: 50.0000 mg | ORAL_TABLET | Freq: Every day | ORAL | 5 refills | Status: DC | PRN

## 2021-11-11 MED ORDER — TADALAFIL 20 MG PO TABS: 10.0000 mg | ORAL_TABLET | ORAL | 11 refills | Status: DC | PRN

## 2021-11-18 DIAGNOSIS — H47323 Drusen of optic disc, bilateral: Secondary | ICD-10-CM | POA: Diagnosis not present

## 2021-11-18 DIAGNOSIS — H5213 Myopia, bilateral: Secondary | ICD-10-CM | POA: Diagnosis not present

## 2021-11-18 DIAGNOSIS — H35373 Puckering of macula, bilateral: Secondary | ICD-10-CM | POA: Diagnosis not present

## 2021-11-18 DIAGNOSIS — H43813 Vitreous degeneration, bilateral: Secondary | ICD-10-CM | POA: Diagnosis not present

## 2021-11-30 ENCOUNTER — Other Ambulatory Visit: Payer: Self-pay | Admitting: Internal Medicine

## 2021-12-03 ENCOUNTER — Encounter: Payer: Self-pay | Admitting: Internal Medicine

## 2021-12-03 ENCOUNTER — Ambulatory Visit (INDEPENDENT_AMBULATORY_CARE_PROVIDER_SITE_OTHER): Payer: BC Managed Care – PPO | Admitting: Internal Medicine

## 2021-12-03 ENCOUNTER — Other Ambulatory Visit: Payer: Self-pay | Admitting: Internal Medicine

## 2021-12-03 VITALS — BP 130/76 | HR 66 | Temp 97.7°F | Ht 67.25 in | Wt 172.0 lb

## 2021-12-03 DIAGNOSIS — I2510 Atherosclerotic heart disease of native coronary artery without angina pectoris: Secondary | ICD-10-CM | POA: Diagnosis not present

## 2021-12-03 DIAGNOSIS — E039 Hypothyroidism, unspecified: Secondary | ICD-10-CM | POA: Diagnosis not present

## 2021-12-03 DIAGNOSIS — Z125 Encounter for screening for malignant neoplasm of prostate: Secondary | ICD-10-CM

## 2021-12-03 DIAGNOSIS — I2584 Coronary atherosclerosis due to calcified coronary lesion: Secondary | ICD-10-CM

## 2021-12-03 DIAGNOSIS — Z0000 Encounter for general adult medical examination without abnormal findings: Secondary | ICD-10-CM | POA: Diagnosis not present

## 2021-12-03 DIAGNOSIS — Z23 Encounter for immunization: Secondary | ICD-10-CM | POA: Diagnosis not present

## 2021-12-03 LAB — CBC
HCT: 45.5 % (ref 39.0–52.0)
Hemoglobin: 15.3 g/dL (ref 13.0–17.0)
MCHC: 33.7 g/dL (ref 30.0–36.0)
MCV: 92 fl (ref 78.0–100.0)
Platelets: 257 10*3/uL (ref 150.0–400.0)
RBC: 4.94 Mil/uL (ref 4.22–5.81)
RDW: 12.6 % (ref 11.5–15.5)
WBC: 6.3 10*3/uL (ref 4.0–10.5)

## 2021-12-03 LAB — LIPID PANEL
Cholesterol: 161 mg/dL (ref 0–200)
HDL: 49.5 mg/dL (ref >39.00)
LDL Cholesterol: 99 mg/dL (ref 0–99)
NonHDL: 111.94
Total CHOL/HDL Ratio: 3
Triglycerides: 67 mg/dL (ref 0.0–149.0)
VLDL: 13.4 mg/dL (ref 0.0–40.0)

## 2021-12-03 LAB — COMPREHENSIVE METABOLIC PANEL
ALT: 15 U/L (ref 0–53)
AST: 19 U/L (ref 0–37)
Albumin: 4.4 g/dL (ref 3.5–5.2)
Alkaline Phosphatase: 58 U/L (ref 39–117)
BUN: 18 mg/dL (ref 6–23)
CO2: 30 mEq/L (ref 19–32)
Calcium: 9.3 mg/dL (ref 8.4–10.5)
Chloride: 104 mEq/L (ref 96–112)
Creatinine, Ser: 1.14 mg/dL (ref 0.40–1.50)
GFR: 67.84 mL/min (ref >60.00)
Glucose, Bld: 99 mg/dL (ref 70–99)
Potassium: 4.6 mEq/L (ref 3.5–5.1)
Sodium: 140 mEq/L (ref 135–145)
Total Bilirubin: 0.9 mg/dL (ref 0.2–1.2)
Total Protein: 6.5 g/dL (ref 6.0–8.3)

## 2021-12-03 LAB — PSA: PSA: 2.46 ng/mL (ref 0.10–4.00)

## 2021-12-03 MED ORDER — EPINEPHRINE 0.3 MG/0.3ML IJ SOAJ: 0.3000 mg | Freq: Once | INTRAMUSCULAR | 1 refills | Status: DC | PRN

## 2021-12-03 NOTE — Progress Notes (Signed)
Subjective:    Patient ID: Willie Griffin, male    DOB: 10-26-1955, 66 y.o.   MRN: [id number]  HPI Here for physical  Fairly satisfied with cialis--helped him maintain erection Still trouble with climax New relationship---happy with this Never heard back about his testosterone levels  Rosacea worse---topical treatments Did have oral antibiotic for some time  No chest pain No SOB Not really exercising---but very active with multiple jobs  Current Outpatient Medications on File Prior to Visit  Medication Sig Dispense Refill   aspirin EC 81 MG tablet Take 1 tablet (81 mg total) by mouth daily. Swallow whole. 90 tablet 3   Cyanocobalamin (VITAMIN B-12 PO) Take 2 Doses by mouth daily.     EPINEPHrine 0.3 mg/0.3 mL IJ SOAJ injection Inject 0.3 mLs (0.3 mg total) into the muscle once as needed for up to 1 dose. 2 each 1   hydrocortisone 2.5 % cream Use rectally twice daily as neeed 30 g 5   levothyroxine (SYNTHROID) 25 MCG tablet TAKE 1 TABLET BY MOUTH EVERY DAY BEFORE BREAKFAST 90 tablet 0   Magnesium 500 MG CAPS Take by mouth.     naproxen sodium (ALEVE) 220 MG tablet Take 220 mg by mouth as needed.     omeprazole (PRILOSEC) 20 MG capsule TAKE 1 CAPSULE BY MOUTH EVERY DAY 90 capsule 3   OVER THE COUNTER MEDICATION Take 2 capsules by mouth daily. Synoviox includes MSM, turmeric, and hyaluronic acid     SOOLANTRA 1 % CREA Apply topically daily. to face  5   tadalafil (CIALIS) 20 MG tablet Take 0.5-1 tablets (10-20 mg total) by mouth every other day as needed for erectile dysfunction. 10 tablet 11   No current facility-administered medications on file prior to visit.    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      Family History  Problem Relation Age of Onset   Heart attack Father    Dementia Father        Lewy body dementia   Heart disease Father        stent x 11    Arthritis Mother    Heart attack Mother    Coronary artery disease Paternal Grandfather    Coronary artery disease Maternal Grandfather    Suicidality Paternal Grandmother    Breast cancer Paternal Grandmother    Stroke Maternal Grandmother    Hodgkin's lymphoma Sister    Cancer Sister    Other Sister        precancerous polyps   Cancer Paternal Aunt        jaw ca   Celiac disease Paternal Uncle    Other Paternal Uncle        mesothelioma   Breast cancer Other    Diabetes Neg Hx    Hypertension Neg Hx    Colon polyps Neg Hx    Colon cancer Neg Hx  Social History   Socioeconomic History   Marital status: Divorced    Spouse name: Not on file   Number of children: 2   Years of education: Not on file   Highest education level: Not on file  Occupational History   Occupation: Librarian, 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   Occupation: Driving school bus    Comment: and other part time work  Tobacco Use   Smoking status: Never    Passive exposure: Past   Smokeless tobacco: Never  Vaping Use   Vaping Use: Never used  Substance and Sexual Activity   Alcohol use: No   Drug use: No   Sexual activity: Not on file  Other Topics Concern   Not on file  Social History Narrative   Not on file   Social Determinants of Health   Financial Resource Strain: Not on file  Food Insecurity: Not on file  Transportation Needs: Not on file  Physical Activity: Not on file  Stress: Not on file  Social Connections: Not on file  Intimate Partner Violence: Not on file   Review of Systems  Constitutional:   Negative for fatigue.       Has lost some weight---staying active Wears seat belt  HENT:  Positive for hearing loss. Negative for dental problem.        Rare tinnitus Keeps up with dentist  Eyes:        No diplopia or unilateral vision loss More floaters though and some blurring----recent eye exam okay though  Respiratory:  Negative for shortness of breath.        Constant throat clearing/cough--omeprazole doesn't help this  Cardiovascular:  Negative for chest pain and palpitations.       Chronic left leg swelling  Gastrointestinal:  Negative for blood in stool and constipation.       Heartburn controlled by omeprazole  Endocrine: Negative for polydipsia and polyuria.  Genitourinary:  Negative for difficulty urinating and urgency.  Musculoskeletal:  Positive for back pain. Negative for arthralgias and joint swelling.       Known disc disease---uses natural supplement mostly  Skin:  Negative for rash.       Has some red spots on left calf  Allergic/Immunologic: Positive for environmental allergies. Negative for immunocompromised state.       Rare OTC zyrtec  Neurological:  Negative for dizziness, syncope, light-headedness and headaches.  Hematological:  Negative for adenopathy. Does not bruise/bleed easily.  Psychiatric/Behavioral:  Negative for dysphoric mood and sleep disturbance. The patient is not nervous/anxious.        Objective:   Physical Exam Constitutional:      Appearance: Normal appearance.  HENT:     Head:     Comments: Mild redness in occiput (?seborrhea)    Mouth/Throat:     Pharynx: No oropharyngeal exudate or posterior oropharyngeal erythema.  Eyes:     Conjunctiva/sclera: Conjunctivae normal.     Pupils: Pupils are equal, round, and reactive to light.  Cardiovascular:     Rate and Rhythm: Normal rate and regular rhythm.     Pulses: Normal pulses.     Heart sounds: No murmur heard.    No gallop.  Pulmonary:     Effort: Pulmonary effort is normal.      Breath sounds: Normal breath sounds. No wheezing or rales.  Abdominal:     Palpations: Abdomen is soft.     Tenderness: There is no abdominal tenderness.  Musculoskeletal:  Right lower leg: No edema.     Left lower leg: No edema.     Comments: Superficial varicosities in lower calves  Skin:    Comments: Scaly area with excoriation left lower calf  Neurological:     General: No focal deficit present.     Mental Status: He is alert and oriented to person, place, and time.  Psychiatric:        Mood and Affect: Mood normal.        Behavior: Behavior normal.            Assessment & Plan:

## 2021-12-03 NOTE — Assessment & Plan Note (Signed)
Seems euthyroid  On levothyroxine

## 2021-12-03 NOTE — Assessment & Plan Note (Signed)
No symptoms Cholesterol is good No action

## 2021-12-03 NOTE — Assessment & Plan Note (Signed)
Has not followed through with colon---messaged GI PA to try to get this set up He requests PSA again Prefers no COVID vaccine Flu vaccine today Prevnar 20 when turns 67

## 2021-12-03 NOTE — Addendum Note (Signed)
Addended by: Pilar Grammes on: 12/03/2021 09:49 AM   Modules accepted: Orders

## 2021-12-04 DIAGNOSIS — L718 Other rosacea: Secondary | ICD-10-CM | POA: Diagnosis not present

## 2021-12-04 DIAGNOSIS — D2262 Melanocytic nevi of left upper limb, including shoulder: Secondary | ICD-10-CM | POA: Diagnosis not present

## 2021-12-04 DIAGNOSIS — Z85828 Personal history of other malignant neoplasm of skin: Secondary | ICD-10-CM | POA: Diagnosis not present

## 2021-12-04 DIAGNOSIS — D225 Melanocytic nevi of trunk: Secondary | ICD-10-CM | POA: Diagnosis not present

## 2021-12-04 NOTE — Progress Notes (Signed)
Hi Remo Lipps, can you check with Barbera Setters to verify the best way to schedule this patient's colonoscopy. I saw him almost one year ago and Dr .Ardis Hughs was supervising physician at that time. Please verify if he needs a follow up appt in our office before we can schedule a colonoscopy and which doc do we schedule patient with?   Thanks

## 2021-12-09 ENCOUNTER — Telehealth: Payer: Self-pay

## 2021-12-09 NOTE — Telephone Encounter (Signed)
-----   Message from Noralyn Pick, NP sent at 12/09/2021  7:42 AM EST -----    ----- Message ----- From: Marlon Pel, RN Sent: 12/04/2021   4:50 PM EST To: Gillermina Hu, RN; Noralyn Pick, NP  Unless there is a new medical reason that requires him to be seen then he can be a direct.  Can use the DOD or next up to determine who to schedule with  ----- Message ----- From: Noralyn Pick, NP Sent: 12/04/2021   2:33 PM EST To: Marlon Pel, RN; Gillermina Hu, RN     ----- Message ----- From: [name], MD Sent: 12/03/2021   9:06 AM EST To: Noralyn Pick, NP  He never heard back about the cost of a colonoscopy--but he is going on Medicare and thinks it will be covered better on that. Can someone contact him about setting this up again. Medicare starts Jan 1 Rich

## 2021-12-09 NOTE — Telephone Encounter (Unsigned)
In regard to CC chart that was copied and pasted below: Pt was called and left a message to call back: Pt is  to be scheduled for a Colonoscopy with Dr. Bryan Lemma who was DOD on 12/03/2021 AM when original message was sent:    Message Received: Today Noralyn Pick, NP  Gillermina Hu, RN      Previous Messages  Routed Note  Author: Noralyn Pick, NP Service: Gastroenterology Author Type: Nurse Practitioner  Filed: 12/09/2021  7:44 AM Encounter Date: 12/03/2021 Status: Signed  Editor: Noralyn Pick, NP (Nurse Practitioner)  Remo Lipps, see Shari's note. Pls contact patient and schedule him for a direct colonoscopy as long as he has not experienced any significant changes in his health history since he was last seen in our office. Per Barbera Setters Can schedule colonoscopy  with Doc of Day or next up.      Office Visit for Annual Exam 12/03/2021 [name], MD - [place] at [facility] Diagnoses  Routine [place] (Primary) Coronary artery calcification Hypothyroidism, unspecified type Prostate cancer screening Need for influenza vaccination Orders Signed This Visit  (6)View All Results Orders Pended This Visit  None Progress Notes  Noralyn Pick, NP at 12/03/2021  8:45 AM  Status: Signed  Remo Lipps, see Shari's note. Pls contact patient and schedule him for a direct colonoscopy as long as he has not experienced any significant changes in his health history since he was last seen in our office. Per Barbera Setters Can schedule colonoscopy  with Doc of Day or next up.     Noralyn Pick, NP at 12/03/2021  8:45 AM  Status: Signed  Scarlette Ar, can you check with Barbera Setters to verify the best way to schedule this patient's colonoscopy. I saw him almost one year ago and Dr .Ardis Hughs was supervising physician at that time. Please verify if he needs a follow up appt in our office before we can schedule a  colonoscopy and which doc do we schedule patient with?    Thanks     [name], MD at 12/03/2021  8:45 AM  Status: Signed  Expand All Collapse All    Subjective:      Subjective  Patient ID: Willie Griffin, male    DOB: 31-Oct-1955, 66 y.o.   MRN: [id number]   HPI Here for physical   Fairly satisfied with cialis--helped him maintain erection Still trouble with climax New relationship---happy with this Never heard back about his testosterone levels   Rosacea worse---topical treatments Did have oral antibiotic for some time   No chest pain No SOB Not really exercising---but very active with multiple jobs         Current Outpatient Medications on File Prior to Visit  Medication Sig Dispense Refill   aspirin EC 81 MG tablet Take 1 tablet (81 mg total) by mouth daily. Swallow whole. 90 tablet 3   Cyanocobalamin (VITAMIN B-12 PO) Take 2 Doses by mouth daily.       EPINEPHrine 0.3 mg/0.3 mL IJ SOAJ injection Inject 0.3 mLs (0.3 mg total) into the muscle once as needed for up to 1 dose. 2 each 1   hydrocortisone 2.5 % cream Use rectally twice daily as neeed 30 g 5   levothyroxine (SYNTHROID) 25 MCG tablet TAKE 1 TABLET BY MOUTH EVERY DAY BEFORE BREAKFAST 90 tablet 0   Magnesium 500 MG CAPS Take by mouth.       naproxen sodium (ALEVE)  220 MG tablet Take 220 mg by mouth as needed.       omeprazole (PRILOSEC) 20 MG capsule TAKE 1 CAPSULE BY MOUTH EVERY DAY 90 capsule 3   OVER THE COUNTER MEDICATION Take 2 capsules by mouth daily. Synoviox includes MSM, turmeric, and hyaluronic acid       SOOLANTRA 1 % CREA Apply topically daily. to face   5   tadalafil (CIALIS) 20 MG tablet Take 0.5-1 tablets (10-20 mg total) by mouth every other day as needed for erectile dysfunction. 10 tablet 11    No current facility-administered medications on file prior to visit.          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               Family History  Problem Relation Age of Onset   Heart attack Father     Dementia Father          Lewy body dementia   Heart disease Father          stent x 11    Arthritis Mother     Heart attack Mother     Coronary artery disease Paternal Grandfather     Coronary artery disease Maternal Grandfather     Suicidality Paternal Grandmother     Breast cancer Paternal Grandmother     Stroke Maternal Grandmother     Hodgkin's lymphoma Sister     Cancer Sister     Other Sister          precancerous polyps   Cancer Paternal Aunt          jaw ca   Celiac disease Paternal Uncle     Other Paternal Uncle          mesothelioma   Breast cancer Other     Diabetes Neg Hx     Hypertension Neg Hx     Colon polyps Neg Hx     Colon cancer Neg Hx        Social History         Socioeconomic History   Marital status: Divorced      Spouse name: Not on file   Number of children: 2   Years of education: Not on file   Highest education level: Not on file  Occupational History   Occupation: Librarian, 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   Occupation: Driving school bus      Comment: and other part time work  Tobacco Use   Smoking status: Never      Passive exposure: Past   Smokeless tobacco: Never  Vaping Use   Vaping Use: Never used  Substance and Sexual Activity  Alcohol use: No   Drug use: No   Sexual activity: Not on file  Other Topics Concern   Not on file  Social History Narrative   Not on file    Social Determinants of  Health    Financial Resource Strain: Not on file  Food Insecurity: Not on file  Transportation Needs: Not on file  Physical Activity: Not on file  Stress: Not on file  Social Connections: Not on file  Intimate Partner Violence: Not on file    Review of Systems  Constitutional:  Negative for fatigue.       Has lost some weight---staying active Wears seat belt  HENT:  Positive for hearing loss. Negative for dental problem.        Rare tinnitus Keeps up with dentist  Eyes:        No diplopia or unilateral vision loss More floaters though and some blurring----recent eye exam okay though  Respiratory:  Negative for shortness of breath.        Constant throat clearing/cough--omeprazole doesn't help this  Cardiovascular:  Negative for chest pain and palpitations.       Chronic left leg swelling  Gastrointestinal:  Negative for blood in stool and constipation.       Heartburn controlled by omeprazole  Endocrine: Negative for polydipsia and polyuria.  Genitourinary:  Negative for difficulty urinating and urgency.  Musculoskeletal:  Positive for back pain. Negative for arthralgias and joint swelling.       Known disc disease---uses natural supplement mostly  Skin:  Negative for rash.       Has some red spots on left calf  Allergic/Immunologic: Positive for environmental allergies. Negative for immunocompromised state.       Rare OTC zyrtec  Neurological:  Negative for dizziness, syncope, light-headedness and headaches.  Hematological:  Negative for adenopathy. Does not bruise/bleed easily.  Psychiatric/Behavioral:  Negative for dysphoric mood and sleep disturbance. The patient is not nervous/anxious.           Objective:    Objective  Physical Exam Constitutional:      Appearance: Normal appearance.  HENT:     Head:     Comments: Mild redness in occiput (?seborrhea)    Mouth/Throat:     Pharynx: No oropharyngeal exudate or posterior oropharyngeal erythema.  Eyes:      Conjunctiva/sclera: Conjunctivae normal.     Pupils: Pupils are equal, round, and reactive to light.  Cardiovascular:     Rate and Rhythm: Normal rate and regular rhythm.     Pulses: Normal pulses.     Heart sounds: No murmur heard.    No gallop.  Pulmonary:     Effort: Pulmonary effort is normal.     Breath sounds: Normal breath sounds. No wheezing or rales.  Abdominal:     Palpations: Abdomen is soft.     Tenderness: There is no abdominal tenderness.  Musculoskeletal:     Right lower leg: No edema.     Left lower leg: No edema.     Comments: Superficial varicosities in lower calves  Skin:    Comments: Scaly area with excoriation left lower calf  Neurological:     General: No focal deficit present.     Mental Status: He is alert and oriented to person, place, and time.  Psychiatric:        Mood and Affect: Mood normal.        Behavior: Behavior normal.  Assessment & Plan:

## 2021-12-09 NOTE — Progress Notes (Signed)
Willie Griffin, see Shari's note. Pls contact patient and schedule him for a direct colonoscopy as long as he has not experienced any significant changes in his health history since he was last seen in our office. Per Barbera Setters Can schedule colonoscopy  with Doc of Day or next up.

## 2021-12-10 NOTE — Telephone Encounter (Signed)
Pt was contacted and made aware of Carl Best NP recommendations: Pt was scheduled for a Colonoscopy with Dr Bryan Lemma on 02/06/2022 at 10:00 AM: Pt made aware: Pt was scheduled for a previsit appointment on 01/29/2021 at 10:00 AM: Pt made aware:  Pt verbalized understanding with all questions answered.

## 2022-01-29 ENCOUNTER — Ambulatory Visit (AMBULATORY_SURGERY_CENTER): Payer: BC Managed Care – PPO | Admitting: *Deleted

## 2022-01-29 VITALS — Ht 68.0 in | Wt 175.0 lb

## 2022-01-29 DIAGNOSIS — R198 Other specified symptoms and signs involving the digestive system and abdomen: Secondary | ICD-10-CM

## 2022-01-29 DIAGNOSIS — K625 Hemorrhage of anus and rectum: Secondary | ICD-10-CM

## 2022-01-29 MED ORDER — NA SULFATE-K SULFATE-MG SULF 17.5-3.13-1.6 GM/177ML PO SOLN: 1.0000 | Freq: Once | ORAL | 0 refills | Status: AC

## 2022-01-29 NOTE — Progress Notes (Signed)
No egg or soy allergy known to patient  No issues known to pt with past sedation with any surgeries or procedures Patient denies ever being told they had issues or difficulty with intubation  No FH of Malignant Hyperthermia Pt is not on diet pills Pt is not on  home 02  Pt is not on blood thinners  Pt denies issues with constipation  Pt is not on dialysis Pt denies any upcoming cardiac testing Pt encouraged to use to use Singlecare or Goodrx to reduce cost  Patient's chart reviewed by Osvaldo Angst CNRA prior to previsit and patient appropriate for the [place].  Previsit completed and red dot placed by patient's name on their procedure day (on provider's schedule).  . Pre-visit done by phone Information sent by mail with coupon

## 2022-02-03 ENCOUNTER — Telehealth: Payer: Self-pay | Admitting: Gastroenterology

## 2022-02-03 NOTE — Telephone Encounter (Signed)
PT needs to have prep instructions sent to mychart. Please advise

## 2022-02-03 NOTE — Telephone Encounter (Signed)
Message left for pt.that prep instructions have been resent via my chart,call back # left for any other concerns.

## 2022-02-04 ENCOUNTER — Encounter: Payer: Self-pay | Admitting: Certified Registered Nurse Anesthetist

## 2022-02-05 ENCOUNTER — Encounter: Payer: Self-pay | Admitting: Gastroenterology

## 2022-02-06 ENCOUNTER — Ambulatory Visit (AMBULATORY_SURGERY_CENTER): Payer: Medicare Other | Admitting: Gastroenterology

## 2022-02-06 ENCOUNTER — Encounter: Payer: Self-pay | Admitting: Gastroenterology

## 2022-02-06 VITALS — BP 131/90 | HR 63 | Temp 98.9°F | Resp 16 | Ht 68.0 in | Wt 178.0 lb

## 2022-02-06 DIAGNOSIS — K5730 Diverticulosis of large intestine without perforation or abscess without bleeding: Secondary | ICD-10-CM

## 2022-02-06 DIAGNOSIS — K641 Second degree hemorrhoids: Secondary | ICD-10-CM | POA: Diagnosis not present

## 2022-02-06 DIAGNOSIS — R194 Change in bowel habit: Secondary | ICD-10-CM

## 2022-02-06 DIAGNOSIS — K625 Hemorrhage of anus and rectum: Secondary | ICD-10-CM

## 2022-02-06 DIAGNOSIS — K921 Melena: Secondary | ICD-10-CM | POA: Diagnosis present

## 2022-02-06 DIAGNOSIS — K635 Polyp of colon: Secondary | ICD-10-CM | POA: Diagnosis not present

## 2022-02-06 DIAGNOSIS — D120 Benign neoplasm of cecum: Secondary | ICD-10-CM

## 2022-02-06 MED ORDER — SODIUM CHLORIDE 0.9 % IV SOLN: 500.0000 mL | Freq: Once | INTRAVENOUS | Status: DC

## 2022-02-06 NOTE — Op Note (Signed)
[place] Patient Name: Willie Griffin Procedure Date: 02/06/2022 10:42 AM MRN: [id number] Endoscopist: Gerrit Heck , MD, [id number] Age: 67 Referring MD:  Date of Birth: April 05, 1955 Gender: Male Account #: [id number] Procedure:                Colonoscopy Indications:              Intermittent painless hematochezia, Heme positive                            stool                           Last colonoscopy was 06/2017 and notable for                            diverticulosis, small internal hemorrhoids. Prior                            to that, colonoscopy 04/2007 with left-sided                            diverticulosis, no polyps. Medicines:                Monitored Anesthesia Care Procedure:                Pre-Anesthesia Assessment:                           - Prior to the procedure, a History and Physical                            was performed, and patient medications and                            allergies were reviewed. The patient's tolerance of                            previous anesthesia was also reviewed. The risks                            and benefits of the procedure and the sedation                            options and risks were discussed with the patient.                            All questions were answered, and informed consent                            was obtained. Prior Anticoagulants: The patient has                            taken no anticoagulant or antiplatelet agents. ASA                            Grade Assessment:  II - A patient with mild systemic                            disease. After reviewing the risks and benefits,                            the patient was deemed in satisfactory condition to                            undergo the procedure.                           After obtaining informed consent, the colonoscope                            was passed under direct vision. Throughout the                            procedure,  the patient's blood pressure, pulse, and                            oxygen saturations were monitored continuously. The                            Olympus Scope [id number] was introduced through the                            anus and advanced to the the cecum, identified by                            appendiceal orifice and ileocecal valve. The                            colonoscopy was performed without difficulty. The                            patient tolerated the procedure well. The quality                            of the bowel preparation was good. The ileocecal                            valve, appendiceal orifice, and rectum were                            photographed. Scope In: 10:49:53 AM Scope Out: 11:03:05 AM Scope Withdrawal Time: 0 hours 10 minutes 53 seconds  Total Procedure Duration: 0 hours 13 minutes 12 seconds  Findings:                 The perianal and digital rectal examinations were                            normal.  A 2 mm polyp was found in the cecum. The polyp was                            sessile. The polyp was removed with a cold snare.                            Resection and retrieval were complete. Estimated                            blood loss was minimal.                           Multiple large-mouthed and small-mouthed                            diverticula were found in the sigmoid colon.                           Non-bleeding internal hemorrhoids were found during                            retroflexion. The hemorrhoids were medium-sized and                            Grade II (internal hemorrhoids that prolapse but                            reduce spontaneously). Complications:            No immediate complications. Estimated Blood Loss:     Estimated blood loss was minimal. Impression:               - One 2 mm polyp in the cecum, removed with a cold                            snare. Resected and retrieved.                            - Diverticulosis in the sigmoid colon.                           - Non-bleeding internal hemorrhoids. Recommendation:           - Patient has a contact number available for                            emergencies. The signs and symptoms of potential                            delayed complications were discussed with the                            patient. Return to normal activities tomorrow.                            Written discharge  instructions were provided to the                            patient.                           - Resume previous diet.                           - Continue present medications.                           - Await pathology results.                           - Repeat colonoscopy for surveillance based on                            pathology results.                           - Return to GI clinic PRN.                           - Internal hemorrhoids were noted on this study and                            may be amenable to hemorrhoid band ligation. If you                            are interested in further treatment of these                            hemorrhoids with band ligation, please contact my                            clinic to set up an appointment for evaluation and                            treatment. Gerrit Heck, MD 02/06/2022 11:11:34 AM

## 2022-02-06 NOTE — Progress Notes (Signed)
Report given to PACU, vss 

## 2022-02-06 NOTE — Patient Instructions (Addendum)
Recommendation- Patient has a contact number available for                            emergencies. The signs and symptoms of potential                            delayed complications were discussed with the                            patient. Return to normal activities tomorrow.                            Written discharge instructions were provided to the                            patient.                           - Resume previous diet.                           - Continue present medications.                           - Await pathology results.                           - Repeat colonoscopy for surveillance based on                            pathology results.                           - Return to GI clinic PRN.                           - Internal hemorrhoids were noted on this study and                            may be amenable to hemorrhoid band ligation. If you                            are interested in further treatment of these                            hemorrhoids with band ligation, please contact my                            clinic to set up an appointment for evaluation and                            treatment.  Handouts on polyps, hemorrhoids and diverticulosis given.  YOU HAD AN ENDOSCOPIC PROCEDURE TODAY AT [facility]:   Refer to the procedure report that was given to you for any specific questions about what was found during the examination.  If the procedure report does not answer your questions, please call your gastroenterologist to clarify.  If you requested that your care partner not be given the details of your procedure findings, then the procedure report has been included in a sealed envelope for you to review at your convenience later.  YOU SHOULD EXPECT: Some feelings of bloating in the abdomen. Passage of more gas than usual.  Walking can help get rid of the air that was put into your GI tract during the procedure and reduce the bloating.  If you had a lower endoscopy (such as a colonoscopy or flexible sigmoidoscopy) you may notice spotting of blood in your stool or on the toilet paper. If you underwent a bowel prep for your procedure, you may not have a normal bowel movement for a few days.  Please Note:  You might notice some irritation and congestion in your nose or some drainage.  This is from the oxygen used during your procedure.  There is no need for concern and it should clear up in a day or so.  SYMPTOMS TO REPORT IMMEDIATELY:  Following lower endoscopy (colonoscopy or flexible sigmoidoscopy):  Excessive amounts of blood in the stool  Significant tenderness or worsening of abdominal pains  Swelling of the abdomen that is new, acute  Fever of 100F or higher  For urgent or emergent issues, a gastroenterologist can be reached at any hour by calling [phone]. Do not use MyChart messaging for urgent concerns.    DIET:  We do recommend a small meal at first, but then you may proceed to your regular diet.  Drink plenty of fluids but you should avoid alcoholic beverages for 24 hours.  ACTIVITY:  You should plan to take it easy for the rest of today and you should NOT DRIVE or use heavy machinery until tomorrow (because of the sedation medicines used during the test).    FOLLOW UP: Our staff will call the number listed on your records the next business day following your procedure.  We will call around 7:15- 8:00 am to check on you and address any questions or concerns that you may have regarding the information given to you following your procedure. If we do not reach you, we will leave a message.     If any biopsies were taken you will be contacted by phone or by letter within the next 1-3 weeks.  Please call us at [phone] if you have not heard about the biopsies in 3 weeks.    SIGNATURES/CONFIDENTIALITY: You and/or your care partner have signed paperwork which will be entered into your electronic medical  record.  These signatures attest to the fact that that the information above on your After Visit Summary has been reviewed and is understood.  Full responsibility of the confidentiality of this discharge information lies with you and/or your care-partner.

## 2022-02-06 NOTE — Progress Notes (Signed)
GASTROENTEROLOGY PROCEDURE H&P NOTE   Primary Care Physician: [name], MD    Reason for Procedure:  Colon cancer screening, heme positive stool  Plan:    Colonoscopy  Patient is appropriate for endoscopic procedure(s) in the ambulatory ([place]) setting.  The nature of the procedure, as well as the risks, benefits, and alternatives were carefully and thoroughly reviewed with the patient. Ample time for discussion and questions allowed. The patient understood, was satisfied, and agreed to proceed.     HPI: Willie Griffin is a 67 y.o. male who presents for colonoscopy for colon cancer screening and evaluation of heme positive stool.  He was last seen in the GI clinic on 12/31/2020.  At that time did report 4 episodes of painless BRBPR with FOBT positive in 11/2020.  Exam with internal hemorrhoids and was treated with topical therapy with plan for eventual repeat colonoscopy.  Presents for colonoscopy today for CRC screening, but can also evaluate for etiology of previous bleeding.  Last colonoscopy was 06/2017 and notable for diverticulosis, small internal hemorrhoids.  Prior to that, colonoscopy 04/2007 with left-sided diverticulosis, no polyps.  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 Authorizing Provider  aspirin EC 81 MG tablet Take 1 tablet (81 mg total) by mouth daily. Swallow whole. 02/28/20  Yes Agbor-Etang, Aaron Edelman, MD  Cyanocobalamin (VITAMIN B-12 PO) Take 2 Doses by mouth daily.   Yes [provider]  hydrocortisone 2.5 % cream  Use rectally twice daily as neeed 11/23/20  Yes [name], MD  levothyroxine (SYNTHROID) 25 MCG tablet TAKE 1 TABLET BY MOUTH EVERY DAY BEFORE BREAKFAST 12/02/21  Yes [name], MD  omeprazole (PRILOSEC) 20 MG capsule TAKE 1 CAPSULE BY MOUTH EVERY DAY 12/14/20  Yes [name], MD  OVER THE COUNTER MEDICATION Take 2 capsules by mouth daily. Synoviox includes MSM, turmeric, and hyaluronic acid   Yes [provider]  SOOLANTRA 1 % CREA Apply topically daily. to face 01/15/17  Yes [provider]  EPIPEN 2-PAK 0.3 MG/0.3ML SOAJ injection INJECT 0.3 MG INTRAMUSCULARLY ONCE AS NEEDED FOR UP TO 1 DOSE 12/03/21   [name], MD  Magnesium 500 MG CAPS Take by mouth.    [provider]  naproxen sodium (ALEVE) 220 MG tablet Take 220 mg by mouth as needed.    [provider]  tadalafil (CIALIS) 20 MG tablet Take 0.5-1 tablets (10-20 mg total) by mouth every other day as needed for erectile dysfunction. 11/11/21   [name], MD    Current Outpatient Medications  Medication Sig Dispense Refill   aspirin EC 81 MG tablet Take 1 tablet (81 mg total) by mouth daily. Swallow whole. 90 tablet 3   Cyanocobalamin (VITAMIN B-12 PO) Take 2 Doses by mouth daily.     hydrocortisone 2.5 % cream Use rectally twice daily as neeed 30 g 5   levothyroxine (SYNTHROID) 25 MCG tablet  TAKE 1 TABLET BY MOUTH EVERY DAY BEFORE BREAKFAST 90 tablet 0   omeprazole (PRILOSEC) 20 MG capsule TAKE 1 CAPSULE BY MOUTH EVERY DAY 90 capsule 3   OVER THE COUNTER MEDICATION Take 2 capsules by mouth daily. Synoviox includes MSM, turmeric, and hyaluronic acid     SOOLANTRA 1 % CREA Apply topically daily. to face  5   EPIPEN 2-PAK 0.3 MG/0.3ML SOAJ injection INJECT 0.3 MG INTRAMUSCULARLY ONCE AS NEEDED FOR UP TO 1 DOSE 2 each 1   Magnesium 500 MG CAPS Take by mouth.     naproxen sodium (ALEVE) 220 MG tablet Take 220 mg by mouth as needed.     tadalafil (CIALIS) 20 MG tablet  Take 0.5-1 tablets (10-20 mg total) by mouth every other day as needed for erectile dysfunction. 10 tablet 11   Current Facility-Administered Medications  Medication Dose Route Frequency Provider Last Rate Last Admin   0.9 %  sodium chloride infusion  500 mL Intravenous Once Johanny Segers V, DO        Allergies as of 02/06/2022 - Review Complete 02/06/2022  Allergen Reaction Noted   Bee venom Other (See Comments) 10/27/2018    Family History  Problem Relation Age of Onset   Arthritis Mother    Heart attack Mother    Heart attack Father    Dementia Father        Lewy body dementia   Heart disease Father        stent x 11    Hodgkin's lymphoma Sister    Cancer Sister    Other Sister        precancerous polyps   Cancer Paternal Aunt        jaw ca   Celiac disease Paternal Uncle    Other Paternal Uncle        mesothelioma   Stroke Maternal Grandmother    Coronary artery disease Maternal Grandfather    Suicidality Paternal Grandmother    Breast cancer Paternal Grandmother    Coronary artery disease Paternal Grandfather    Breast cancer Other    Diabetes Neg Hx    Hypertension Neg Hx    Colon polyps Neg Hx    Colon cancer Neg Hx    Rectal cancer Neg Hx    Stomach cancer Neg Hx    Esophageal cancer Neg Hx     Social History   Socioeconomic History   Marital status: Divorced    Spouse name: Not on file   Number of children: 2   Years of education: Not on file   Highest education level: Not on file  Occupational History   Occupation: Librarian, 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   Occupation: Driving school bus    Comment: and other part time work  Tobacco Use   Smoking status: Never    Passive exposure: Past   Smokeless tobacco: Never  Vaping Use   Vaping Use: Never used  Substance and Sexual Activity   Alcohol use: No   Drug use: No   Sexual activity: Not on file  Other Topics Concern   Not on file  Social History Narrative    Not on file   Social Determinants of Health   Financial Resource Strain: Not on file  Food Insecurity: Not on file  Transportation Needs: Not on file  Physical Activity: Not on file  Stress: Not on file  Social Connections: Not on file  Intimate Partner Violence: Not on  file    Physical Exam: Vital signs in last 24 hours: '@BP'$  (!) 111/59   Pulse 62   Temp 98.9 F (37.2 C) (Temporal)   Ht '5\' 8"'$  (1.727 m)   Wt 178 lb (80.7 kg)   SpO2 98%   BMI 27.06 kg/m  GEN: NAD EYE: Sclerae anicteric ENT: MMM CV: Non-tachycardic Pulm: CTA b/l GI: Soft, NT/ND NEURO:  Alert & Oriented x 3   Gerrit Heck, DO [facility]   02/06/2022 10:40 AM

## 2022-02-06 NOTE — Progress Notes (Signed)
VS completed by DT.  Pt's states no medical or surgical changes since previsit or office visit.  

## 2022-02-07 ENCOUNTER — Telehealth: Payer: Self-pay | Admitting: *Deleted

## 2022-02-07 NOTE — Telephone Encounter (Signed)
No answer on  follow up call. Left message.   

## 2022-02-16 ENCOUNTER — Other Ambulatory Visit: Payer: Self-pay | Admitting: Internal Medicine

## 2022-02-17 ENCOUNTER — Encounter: Payer: Self-pay | Admitting: Gastroenterology

## 2022-03-05 ENCOUNTER — Other Ambulatory Visit: Payer: Self-pay | Admitting: Internal Medicine

## 2022-05-08 ENCOUNTER — Encounter: Payer: Self-pay | Admitting: Internal Medicine

## 2022-06-11 IMAGING — CT CT CARDIAC CORONARY ARTERY CALCIUM SCORE
3 series · 14 of 20 positions shown, 16 images · non-contrast
Comparison: 11/23/2019 chest radiograph
COMPARISON: 11/23/2019 chest radiograph

Addendum:
EXAM:
OVER-READ INTERPRETATION  CT CHEST

The following report is an over-read performed by radiologist Dr.
Nadira Flo [REDACTED] on 02/27/2020. This over-read
does not include interpretation of cardiac or coronary anatomy or
pathology. The calcium score interpretation by the cardiologist is
attached.
CLINICAL DATA: Risk stratification
Coronary Calcium Score
TECHNIQUE: The patient was scanned on a Siemens go.Top Scanner. Axial
non-contrast 3 mm slices were carried out through the heart. The
data set was analyzed on a dedicated work station and scored using
the Agatson method.

[Series 2: sa36 calcium scoring 3.00 · axial · 0.38mm/px · z∈[-1129,-1030]mm · 4 of 56 slices shown]
[im 12/56  vessel]
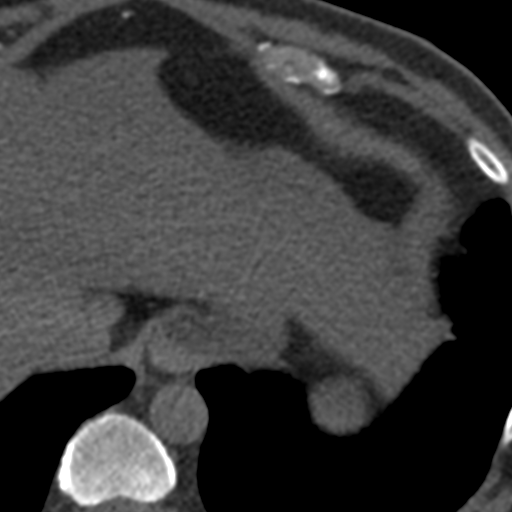
[im 23/56  vessel]
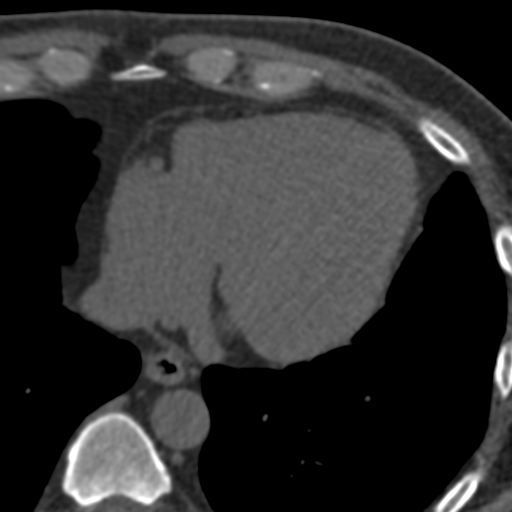
[im 34/56  vessel]
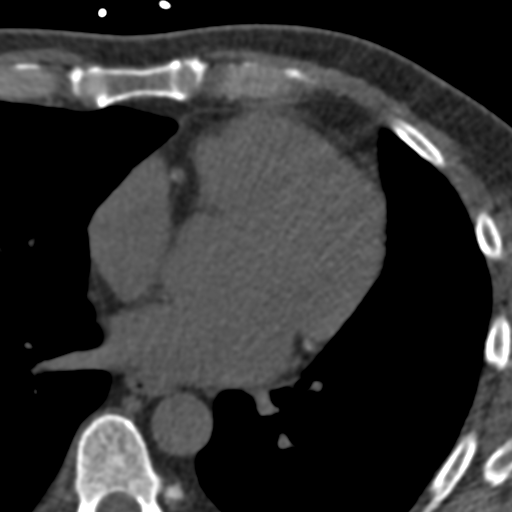
[im 45/56  vessel]
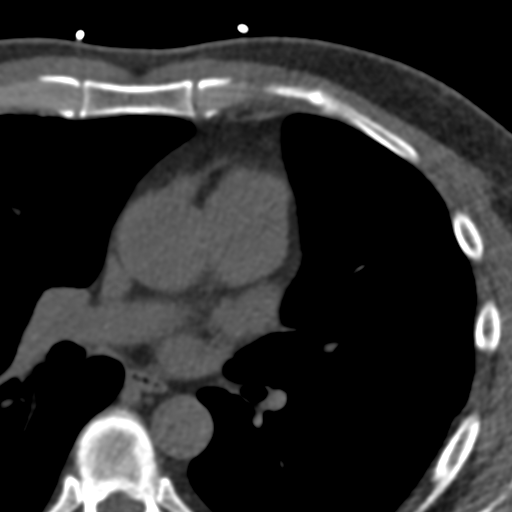

[Series 5: full fov st calcium scoring 3.00 · axial · 0.71mm/px · z∈[-1135,-1027]mm · 5 of 56 slices shown, 7 images]
[im 10/56  vessel]
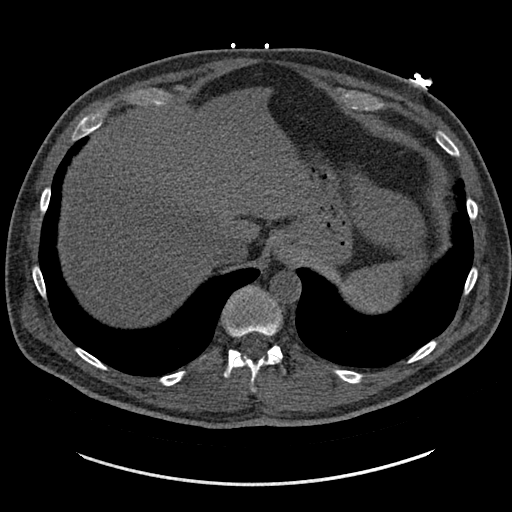
[im 10/56  lung]
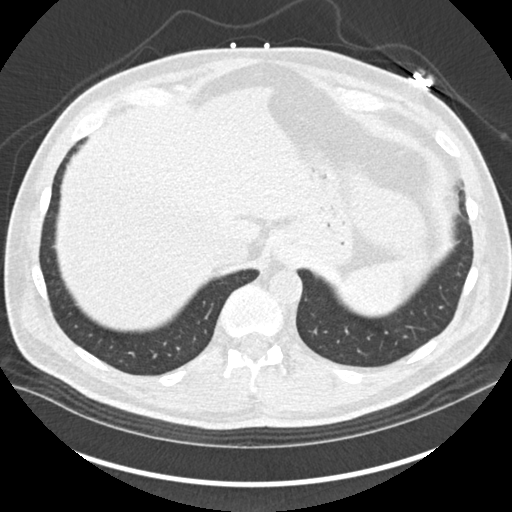
[im 19/56  vessel]
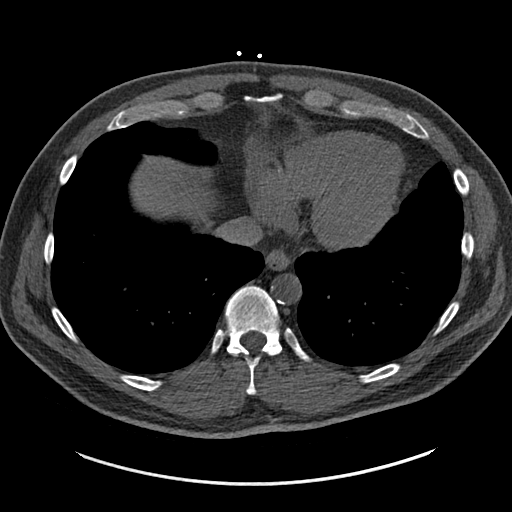
[im 28/56  vessel]
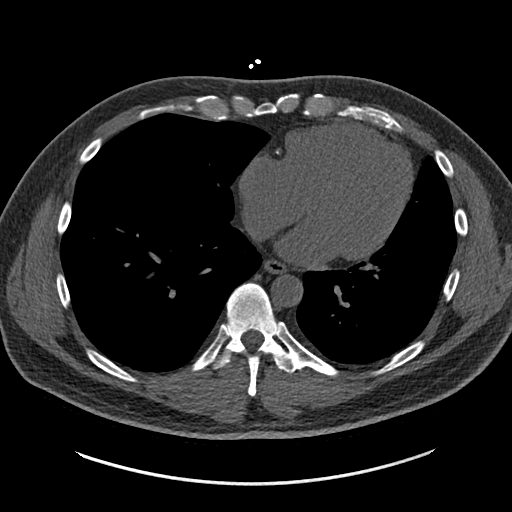
[im 37/56  vessel]
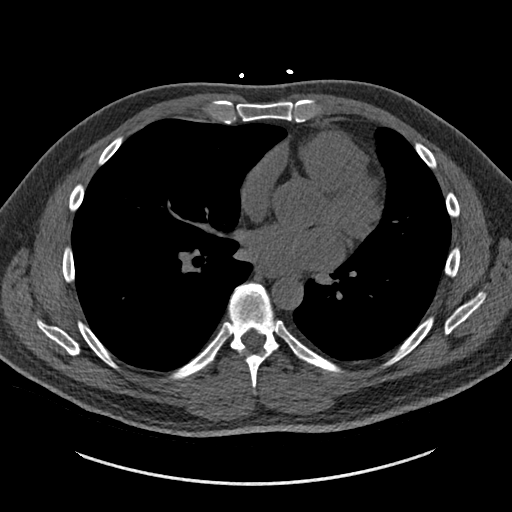
[im 46/56  vessel]
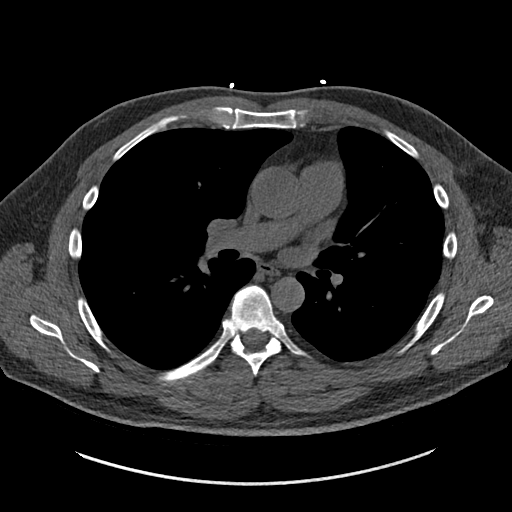
[im 46/56  lung]
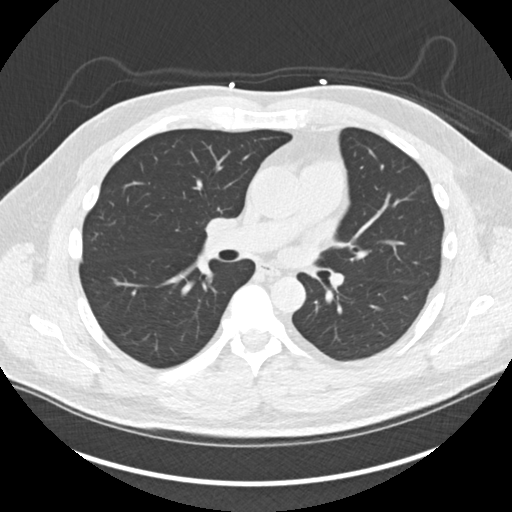

[Series 10: full fov lungs calcium scoring 3.00 ax · axial · 0.71mm/px · z∈[-1135,-1027]mm · 5 of 56 slices shown]
[im 10/56  vessel]
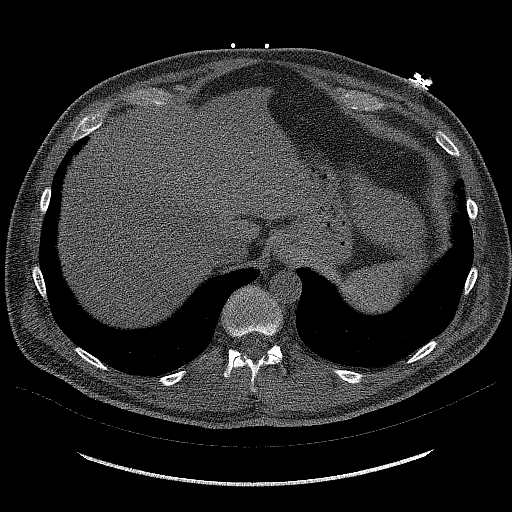
[im 19/56  vessel]
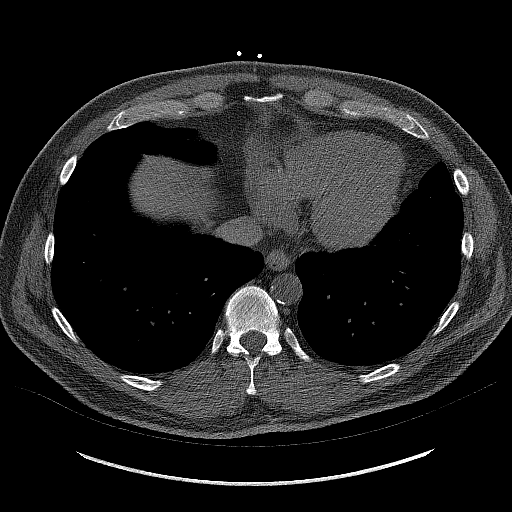
[im 28/56  vessel]
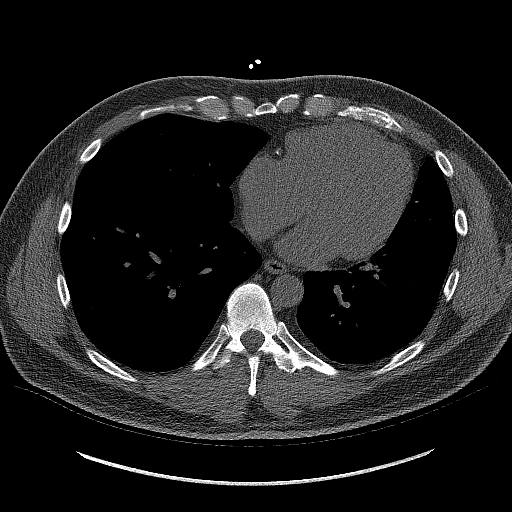
[im 37/56  vessel]
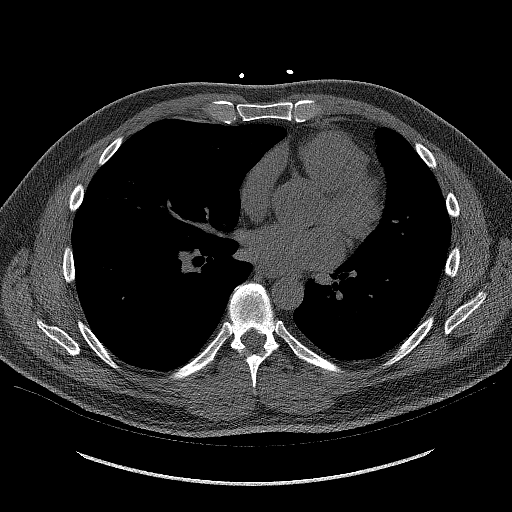
[im 46/56  vessel]
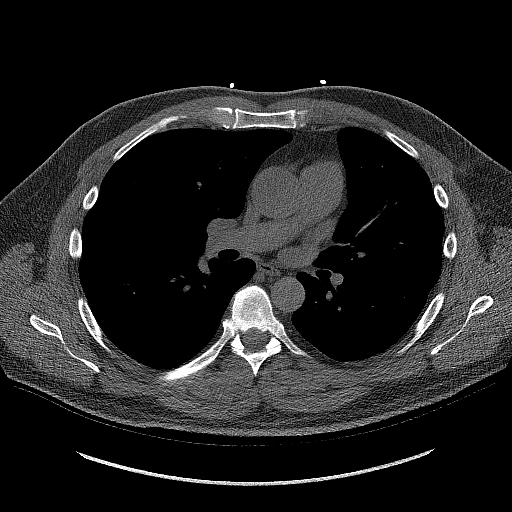

[14 of 20 positions shown; findings below may reference images not displayed]

FINDINGS: Vascular: Aortic atherosclerosis.

Mediastinum/Nodes: No imaged thoracic adenopathy.

Lungs/Pleura: No pleural fluid. 3 mm pleural-based left lower lobe
pulmonary nodule on 37/10.

Upper Abdomen: Moderate hepatic steatosis. Normal imaged portions of
the spleen, stomach.

Musculoskeletal: No acute osseous abnormality.
IMPRESSION: 1.  No acute findings in the imaged extracardiac chest.
2.  Aortic Atherosclerosis (JJUUX-VKR.R).
3. 3 mm left lower lobe pulmonary nodule. No follow-up needed if
patient is low-risk. Non-contrast chest CT can be considered in 12
months if patient is high-risk. This recommendation follows the
consensus statement: Guidelines for Management of Incidental
Pulmonary Nodules Detected on CT Images: From the [HOSPITAL]
4. Hepatic steatosis
FINDINGS: Non-cardiac: See separate report from [REDACTED].

Ascending Aorta: Normal size

Pericardium: Normal

Coronary arteries: Normal origin of left and right coronary
arteries. Distribution of arterial calcifications if present, as
noted below;

LM 0

LAD

LCx

RCA

Total 112
IMPRESSION: 1. Coronary calcium score of 112. This was 62nd percentile for age
and sex matched control.

2.  Recommend Asprin and statin if no contraindications

Anna-Leea Ortju

*** End of Addendum ***
EXAM:
OVER-READ INTERPRETATION  CT CHEST

The following report is an over-read performed by radiologist Dr.
Nadira Flo [REDACTED] on 02/27/2020. This over-read
does not include interpretation of cardiac or coronary anatomy or
pathology. The calcium score interpretation by the cardiologist is
attached.
FINDINGS: Vascular: Aortic atherosclerosis.

Mediastinum/Nodes: No imaged thoracic adenopathy.

Lungs/Pleura: No pleural fluid. 3 mm pleural-based left lower lobe
pulmonary nodule on 37/10.

Upper Abdomen: Moderate hepatic steatosis. Normal imaged portions of
the spleen, stomach.

Musculoskeletal: No acute osseous abnormality.
IMPRESSION: 1.  No acute findings in the imaged extracardiac chest.
2.  Aortic Atherosclerosis (JJUUX-VKR.R).
3. 3 mm left lower lobe pulmonary nodule. No follow-up needed if
patient is low-risk. Non-contrast chest CT can be considered in 12
months if patient is high-risk. This recommendation follows the
consensus statement: Guidelines for Management of Incidental
Pulmonary Nodules Detected on CT Images: From the [HOSPITAL]
4. Hepatic steatosis

## 2022-06-18 ENCOUNTER — Encounter: Payer: Self-pay | Admitting: Cardiology

## 2022-07-11 ENCOUNTER — Ambulatory Visit: Payer: Medicare Other | Attending: Cardiology | Admitting: Cardiology

## 2022-07-11 ENCOUNTER — Encounter: Payer: Self-pay | Admitting: Cardiology

## 2022-07-11 ENCOUNTER — Other Ambulatory Visit
Admission: RE | Admit: 2022-07-11 | Discharge: 2022-07-11 | Disposition: A | Discharge status: Home / Self Care | Payer: Medicare Other | Source: Ambulatory Visit | Attending: Cardiology | Admitting: Cardiology

## 2022-07-11 VITALS — BP 138/62 | HR 64 | Ht 67.0 in | Wt 180.4 lb

## 2022-07-11 DIAGNOSIS — I2510 Atherosclerotic heart disease of native coronary artery without angina pectoris: Secondary | ICD-10-CM | POA: Diagnosis not present

## 2022-07-11 DIAGNOSIS — R072 Precordial pain: Secondary | ICD-10-CM | POA: Diagnosis not present

## 2022-07-11 DIAGNOSIS — M79605 Pain in left leg: Secondary | ICD-10-CM | POA: Diagnosis not present

## 2022-07-11 DIAGNOSIS — E785 Hyperlipidemia, unspecified: Secondary | ICD-10-CM

## 2022-07-11 LAB — BASIC METABOLIC PANEL
Anion gap: 7 (ref 5–15)
BUN: 16 mg/dL (ref 8–23)
CO2: 28 mmol/L (ref 22–32)
Calcium: 8.8 mg/dL — ABNORMAL LOW (ref 8.9–10.3)
Chloride: 105 mmol/L (ref 98–111)
Creatinine, Ser: 1.03 mg/dL (ref 0.61–1.24)
GFR, Estimated: >60 mL/min (ref >60)
Glucose, Bld: 78 mg/dL (ref 70–99)
Potassium: 4 mmol/L (ref 3.5–5.1)
Sodium: 140 mmol/L (ref 135–145)

## 2022-07-11 MED ORDER — METOPROLOL TARTRATE 100 MG PO TABS: 100.0000 mg | ORAL_TABLET | Freq: Once | ORAL | 0 refills | Status: DC

## 2022-07-11 NOTE — Patient Instructions (Signed)
Medication Instructions:   Your physician recommends that you continue on your current medications as directed. Please refer to the Current Medication list given to you today.  *If you need a refill on your cardiac medications before your next appointment, please call your pharmacy*   Lab Work:  Your physician recommends you to go to the medical mall for labs -- BMP  If you have labs (blood work) drawn today and your tests are completely normal, you will receive your results only by: MyChart Message (if you have MyChart) OR A paper copy in the mail If you have any lab test that is abnormal or we need to change your treatment, we will call you to review the results.   Testing/Procedures:    Your cardiac CT will be scheduled at   [facility] [address] [place], [place] [address] [phone]  OR   [facility] [address] [place], [place] [address] [phone]  If scheduled at [facility] or [facility], please arrive 15 mins early for check-in and test prep.   Please follow these instructions carefully (unless otherwise directed):  Hold all erectile dysfunction medications at least 3 days (72 hrs) prior to test. (Ie viagra, cialis, sildenafil, tadalafil, etc) We will administer nitroglycerin during this exam.   On the Night Before the Test: Be sure to Drink plenty of water. Do not consume any caffeinated/decaffeinated beverages or chocolate 12 hours prior to your test. Do not take any antihistamines 12 hours prior to your test.  On the Day of the Test: Drink plenty of water until 1 hour prior to the test. Do not eat any food 1 hour prior to test. You may take your regular medications prior to the test.  Take metoprolol (Lopressor) two hours prior to test.  After the Test: Drink plenty of water. After receiving IV contrast, you may experience  a mild flushed feeling. This is normal. On occasion, you may experience a mild rash up to 24 hours after the test. This is not dangerous. If this occurs, you can take Benadryl 25 mg and increase your fluid intake. If you experience trouble breathing, this can be serious. If it is severe call 911 IMMEDIATELY. If it is mild, please call our office.   For scheduling needs, including cancellations and rescheduling, please call [place], [phone].    Follow-Up: At [facility], you and your health needs are our priority.  As part of our continuing mission to provide you with exceptional heart care, we have created designated Provider Care Teams.  These Care Teams include your primary Cardiologist (physician) and Advanced Practice Providers (APPs -  Physician Assistants and Nurse Practitioners) who all work together to provide you with the care you need, when you need it.  We recommend signing up for the patient portal called "MyChart".  Sign up information is provided on this After Visit Summary.  MyChart is used to connect with patients for Virtual Visits (Telemedicine).  Patients are able to view lab/test results, encounter notes, upcoming appointments, etc.  Non-urgent messages can be sent to your provider as well.   To learn more about what you can do with MyChart, go to [email or web address].    Your next appointment:    After testing  Provider:   You may see Debbe Odea, MD or one of the following Advanced Practice Providers on your designated Care Team:   Nicolasa Ducking, NP Eula Listen, PA-C  Cadence Fransico Michael, PA-C Charlsie Quest, NP

## 2022-07-11 NOTE — Progress Notes (Signed)
Cardiology Office Note:    Date:  07/11/2022   ID:  Willie Griffin, DOB 03/24/1955, MRN [id number]  PCP:  Karie Schwalbe, MD  [facility] Cardiologist:  Debbe Odea, MD  [facility] Electrophysiologist:  None   Referring MD: Karie Schwalbe, MD   Chief Complaint  Patient presents with   Follow-up    Patient reports intermittent chest pain and dyspnea on exertion.      History of Present Illness:    Willie Griffin is a 67 y.o. male with a hx of hyperlipidemia, GERD, coronary calcification, hypothyroidism who presents for follow-up due to chest pain.   Previously seen about 2 years ago with symptoms of chest pain.  Additional workup recommended but his symptoms improved.  Also does not want to perform additional testing due to cost issues.  Over the past several weeks, he has noticed chest pain and shortness of breath with exertion.  Also has left leg pain, tender to palpation with varicose veins.  Hyperlipidemia has been adequately managed with eating healthier.  Prior notes Coronary calcium score 01/2020 was 112, 62nd percentile. Echocardiogram 12/2019 showed normal systolic and diastolic function.    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 Swallow whole.   Cyanocobalamin (VITAMIN B-12 PO) Take 2 Doses by mouth daily.   EPIPEN 2-PAK 0.3 MG/0.3ML SOAJ injection INJECT 0.3 MG INTRAMUSCULARLY ONCE AS NEEDED FOR UP TO 1 DOSE   hydrocortisone 2.5 % cream Use rectally twice daily as neeed    levothyroxine (SYNTHROID) 25 MCG tablet TAKE 1 TABLET BY MOUTH EVERY DAY BEFORE BREAKFAST   metoprolol tartrate (LOPRESSOR) 100 MG tablet Take 1 tablet (100 mg total) by mouth once for 1 dose. TWO HOURS PRIOR TO CARDIAC CT   naproxen sodium (ALEVE) 220 MG tablet Take 220 mg by mouth as needed.   omeprazole (PRILOSEC) 20 MG capsule TAKE 1 CAPSULE BY MOUTH EVERY DAY (Patient taking differently: Take 20 mg by mouth every other day.)   OVER THE COUNTER MEDICATION Take 2 capsules by mouth daily. Synoviox includes MSM, turmeric, and hyaluronic acid   SOOLANTRA 1 % CREA Apply topically daily. to face   tadalafil (CIALIS) 20 MG tablet Take 0.5-1 tablets (10-20 mg total) by mouth every other day as needed for erectile dysfunction.     Allergies:   Bee venom   Social History   Socioeconomic History   Marital status: Divorced    Spouse name: Not on file   Number of children: 2   Years of education: Not on file   Highest education level: Not on file  Occupational History   Occupation: Merchandiser, retail at [employer]    Comment: for the [employer] [place]--retired 1/22   Occupation: Driving school bus    Comment: and other part time work  Tobacco Use   Smoking status: Never    Passive exposure: Past  Smokeless tobacco: Never  Vaping Use   Vaping Use: Never used  Substance and Sexual Activity   Alcohol use: No   Drug use: No   Sexual activity: Not on file  Other Topics Concern   Not on file  Social History Narrative   Not on file   Social Determinants of Health   Financial Resource Strain: Not on file  Food Insecurity: Not on file  Transportation Needs: Not on file  Physical Activity: Not on file  Stress: Not on file  Social Connections: Not on file     Family History: The patient's family history includes Arthritis in his mother; Breast cancer in his paternal grandmother and another family member; Cancer in his paternal aunt and sister; Celiac disease in his paternal uncle; Coronary  artery disease in his maternal grandfather and paternal grandfather; Dementia in his father; Heart attack in his father and mother; Heart disease in his father; Hodgkin's lymphoma in his sister; Other in his paternal uncle and sister; Stroke in his maternal grandmother; Suicidality in his paternal grandmother. There is no history of Diabetes, Hypertension, Colon polyps, Colon cancer, Rectal cancer, Stomach cancer, or Esophageal cancer.  ROS:   Please see the history of present illness.     All other systems reviewed and are negative.  EKGs/Labs/Other Studies Reviewed:    The following studies were reviewed today:   EKG:  EKG is ordered today.  EKG shows normal sinus rhythm, normal ECG.  Recent Labs: 12/03/2021: ALT 15; Hemoglobin 15.3; Platelets 257.0 07/11/2022: BUN 16; Creatinine, Ser 1.03; Potassium 4.0; Sodium 140  Recent Lipid Panel    Component Value Date/Time   CHOL 161 12/03/2021 0957   TRIG 67.0 12/03/2021 0957   HDL 49.50 12/03/2021 0957   CHOLHDL 3 12/03/2021 0957   VLDL 13.4 12/03/2021 0957   LDLCALC 99 12/03/2021 0957     Risk Assessment/Calculations:      Physical Exam:    VS:  BP 138/62 (BP Location: Left Arm, Patient Position: Sitting, Cuff Size: Normal)   Pulse 64   Ht 5\' 7"  (1.702 m)   Wt 180 lb 6.4 oz (81.8 kg)   SpO2 96%   BMI 28.25 kg/m     Wt Readings from Last 3 Encounters:  07/11/22 180 lb 6.4 oz (81.8 kg)  02/06/22 178 lb (80.7 kg)  01/29/22 175 lb (79.4 kg)     GEN:  Well nourished, well developed in no acute distress HEENT: Normal NECK: No JVD; No carotid bruits CARDIAC: RRR, no murmurs, rubs, gallops RESPIRATORY:  Clear to auscultation without rales, wheezing or rhonchi  ABDOMEN: Soft, non-tender, non-distended MUSCULOSKELETAL:  No edema; left lower extremity varicose veins noted SKIN: Warm and dry NEUROLOGIC:  Alert and oriented x 3 PSYCHIATRIC:  Normal affect   ASSESSMENT:    1. Precordial pain   2. Coronary artery disease  involving native heart, unspecified vessel or lesion type, unspecified whether angina present   3. Hyperlipidemia, unspecified hyperlipidemia type   4. Pain of left lower extremity     PLAN:    In order of problems listed above:  Chest pain, symptoms consistent with angina.  Echocardiogram 2021 EF 55 to 60%.  Obtain coronary CT. Coronary calcium scan showed a calcium score of 112, involving all 3 arteries.  Continue aspirin, previously declined statin. Hyperlipidemia, cholesterol controlled with diet. Left lower extremity varicose veins, posterior tibial tender to palpation.  Referral to vein and vascular clinic recommended, patient currently would like to wait.  Follow-up after  cardiac testing   Shared Decision Making/Informed Consent       Medication Adjustments/Labs and Tests Ordered: Current medicines are reviewed at length with the patient today.  Concerns regarding medicines are outlined above.  Orders Placed This Encounter  Procedures   CT CORONARY MORPH W/CTA COR W/SCORE W/CA W/CM &/OR WO/CM   Basic Metabolic Panel (BMET)   Meds ordered this encounter  Medications   metoprolol tartrate (LOPRESSOR) 100 MG tablet    Sig: Take 1 tablet (100 mg total) by mouth once for 1 dose. TWO HOURS PRIOR TO CARDIAC CT    Dispense:  1 tablet    Refill:  0    Patient Instructions  Medication Instructions:   Your physician recommends that you continue on your current medications as directed. Please refer to the Current Medication list given to you today.  *If you need a refill on your cardiac medications before your next appointment, please call your pharmacy*   Lab Work:  Your physician recommends you to go to the medical mall for labs -- BMP  If you have labs (blood work) drawn today and your tests are completely normal, you will receive your results only by: MyChart Message (if you have MyChart) OR A paper copy in the mail If you have any lab test that is abnormal or we  need to change your treatment, we will call you to review the results.   Testing/Procedures:    Your cardiac CT will be scheduled at   [facility] [address] [place], [place] [address] [phone]  OR   [facility] [address] [place], [place] [address] [phone]  If scheduled at [facility] or [facility], please arrive 15 mins early for check-in and test prep.   Please follow these instructions carefully (unless otherwise directed):  Hold all erectile dysfunction medications at least 3 days (72 hrs) prior to test. (Ie viagra, cialis, sildenafil, tadalafil, etc) We will administer nitroglycerin during this exam.   On the Night Before the Test: Be sure to Drink plenty of water. Do not consume any caffeinated/decaffeinated beverages or chocolate 12 hours prior to your test. Do not take any antihistamines 12 hours prior to your test.  On the Day of the Test: Drink plenty of water until 1 hour prior to the test. Do not eat any food 1 hour prior to test. You may take your regular medications prior to the test.  Take metoprolol (Lopressor) two hours prior to test.  After the Test: Drink plenty of water. After receiving IV contrast, you may experience a mild flushed feeling. This is normal. On occasion, you may experience a mild rash up to 24 hours after the test. This is not dangerous. If this occurs, you can take Benadryl 25 mg and increase your fluid intake. If you experience trouble breathing, this can be serious. If it is severe call 911 IMMEDIATELY. If it is mild, please call our office.   For scheduling needs, including cancellations and rescheduling, please call [place], [phone].    Follow-Up: At [facility], you and your health needs are our priority.  As part of our continuing mission to provide you with exceptional  heart care, we have created designated Provider Care Teams.  These Care Teams include your primary Cardiologist (physician) and Advanced Practice Providers (APPs -  Physician Assistants and Nurse Practitioners) who all work together to provide you with the care you need,  when you need it.  We recommend signing up for the patient portal called "MyChart".  Sign up information is provided on this After Visit Summary.  MyChart is used to connect with patients for Virtual Visits (Telemedicine).  Patients are able to view lab/test results, encounter notes, upcoming appointments, etc.  Non-urgent messages can be sent to your provider as well.   To learn more about what you can do with MyChart, go to [email or web address].    Your next appointment:    After testing  Provider:   You may see Debbe Odea, MD or one of the following Advanced Practice Providers on your designated Care Team:   Nicolasa Ducking, NP Eula Listen, PA-C Cadence Fransico Michael, PA-C Charlsie Quest, NP   Signed, Debbe Odea, MD  07/11/2022 12:31 PM    [facility]

## 2022-07-16 ENCOUNTER — Encounter (HOSPITAL_COMMUNITY): Payer: Self-pay

## 2022-07-21 NOTE — Addendum Note (Signed)
Addended by: Guerry Minors on: 07/21/2022 11:22 AM   Modules accepted: Orders

## 2022-08-06 ENCOUNTER — Ambulatory Visit: Payer: BC Managed Care – PPO | Admitting: Cardiology

## 2022-08-12 ENCOUNTER — Encounter (HOSPITAL_COMMUNITY): Payer: Self-pay

## 2022-08-14 ENCOUNTER — Ambulatory Visit
Admission: RE | Admit: 2022-08-14 | Discharge: 2022-08-14 | Disposition: A | Discharge status: Home / Self Care | Payer: Medicare Other | Source: Ambulatory Visit | Attending: Cardiology | Admitting: Cardiology

## 2022-08-14 ENCOUNTER — Other Ambulatory Visit: Payer: Self-pay | Admitting: Cardiology

## 2022-08-14 DIAGNOSIS — I2510 Atherosclerotic heart disease of native coronary artery without angina pectoris: Secondary | ICD-10-CM | POA: Diagnosis not present

## 2022-08-14 DIAGNOSIS — R931 Abnormal findings on diagnostic imaging of heart and coronary circulation: Secondary | ICD-10-CM

## 2022-08-14 DIAGNOSIS — R072 Precordial pain: Secondary | ICD-10-CM | POA: Diagnosis present

## 2022-08-14 MED ORDER — NITROGLYCERIN 0.4 MG SL SUBL
0.8000 mg | SUBLINGUAL_TABLET | Freq: Once | SUBLINGUAL | Status: AC
  Administered 2022-08-14: 0.8 mg via SUBLINGUAL

## 2022-08-14 MED ORDER — IOHEXOL 350 MG/ML SOLN
75.0000 mL | Freq: Once | INTRAVENOUS | Status: AC | PRN
  Administered 2022-08-14: 75 mL via INTRAVENOUS

## 2022-08-14 MED ORDER — SODIUM CHLORIDE 0.9 % IV SOLN: INTRAVENOUS | Status: DC

## 2022-08-14 NOTE — Progress Notes (Signed)

## 2022-08-15 ENCOUNTER — Other Ambulatory Visit
Admission: RE | Admit: 2022-08-15 | Discharge: 2022-08-15 | Disposition: A | Discharge status: Home / Self Care | Payer: Medicare Other | Source: Ambulatory Visit | Attending: Cardiology | Admitting: Cardiology

## 2022-08-15 ENCOUNTER — Ambulatory Visit: Payer: Medicare Other | Attending: Cardiology | Admitting: Cardiology

## 2022-08-15 ENCOUNTER — Encounter: Payer: Self-pay | Admitting: Cardiology

## 2022-08-15 VITALS — BP 142/62 | HR 58 | Ht 67.5 in | Wt 183.0 lb

## 2022-08-15 DIAGNOSIS — E785 Hyperlipidemia, unspecified: Secondary | ICD-10-CM | POA: Diagnosis present

## 2022-08-15 DIAGNOSIS — I2510 Atherosclerotic heart disease of native coronary artery without angina pectoris: Secondary | ICD-10-CM | POA: Insufficient documentation

## 2022-08-15 LAB — BASIC METABOLIC PANEL
Anion gap: 6 (ref 5–15)
BUN: 20 mg/dL (ref 8–23)
CO2: 26 mmol/L (ref 22–32)
Calcium: 8.5 mg/dL — ABNORMAL LOW (ref 8.9–10.3)
Chloride: 104 mmol/L (ref 98–111)
Creatinine, Ser: 1.04 mg/dL (ref 0.61–1.24)
GFR, Estimated: >60 mL/min (ref >60)
Glucose, Bld: 117 mg/dL — ABNORMAL HIGH (ref 70–99)
Potassium: 4 mmol/L (ref 3.5–5.1)
Sodium: 136 mmol/L (ref 135–145)

## 2022-08-15 LAB — CBC
HCT: 43.3 % (ref 39.0–52.0)
Hemoglobin: 15.5 g/dL (ref 13.0–17.0)
MCH: 31.8 pg (ref 26.0–34.0)
MCHC: 35.8 g/dL (ref 30.0–36.0)
MCV: 88.7 fL (ref 80.0–100.0)
Platelets: 253 10*3/uL (ref 150–400)
RBC: 4.88 MIL/uL (ref 4.22–5.81)
RDW: 11.7 % (ref 11.5–15.5)
WBC: 8.1 10*3/uL (ref 4.0–10.5)
nRBC: 0 % (ref 0.0–0.2)

## 2022-08-15 MED ORDER — ATORVASTATIN CALCIUM 20 MG PO TABS: 20.0000 mg | ORAL_TABLET | Freq: Every day | ORAL | 3 refills | Status: DC

## 2022-08-15 NOTE — Progress Notes (Signed)
Cardiology Office Note:    Date:  08/15/2022   ID:  Willie Griffin, DOB 1955/08/09, MRN [id number]  PCP:  Karie Schwalbe, MD  [facility] Cardiologist:  Debbe Odea, MD  [facility] Electrophysiologist:  None   Referring MD: Karie Schwalbe, MD   Chief Complaint  Patient presents with   Follow-up    Discuss cardiac test results.    History of Present Illness:    Willie Griffin is a 67 y.o. male with a hx of hyperlipidemia, GERD, coronary calcification, hypothyroidism who presents for follow-up   Patient was last seen due to symptoms of chest pain.  Coronary CT was obtained to evaluate CAD.  Coronary CTA performed yesterday showed significant stenosis in the proximal left circumflex.  Patient is still hesitant regarding starting statin.  No new concerns today.  Prior notes Coronary calcium score 01/2020 was 112, 62nd percentile. Echocardiogram 12/2019 showed normal systolic and diastolic function.    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 Swallow whole.   atorvastatin (LIPITOR) 20 MG tablet Take 1 tablet (20 mg total) by mouth daily.   Cyanocobalamin (VITAMIN B-12 PO) Take 2 Doses by mouth daily.   EPIPEN 2-PAK 0.3 MG/0.3ML SOAJ injection INJECT 0.3 MG INTRAMUSCULARLY ONCE AS NEEDED FOR UP TO 1 DOSE   hydrocortisone 2.5 % cream Use rectally twice daily as neeed   levothyroxine (SYNTHROID) 25 MCG tablet TAKE 1 TABLET BY MOUTH EVERY DAY BEFORE BREAKFAST   naproxen sodium (ALEVE)  220 MG tablet Take 220 mg by mouth as needed.   omeprazole (PRILOSEC) 20 MG capsule TAKE 1 CAPSULE BY MOUTH EVERY DAY (Patient taking differently: Take 20 mg by mouth every other day.)   OVER THE COUNTER MEDICATION Take 2 capsules by mouth daily. Synoviox includes MSM, turmeric, and hyaluronic acid   SOOLANTRA 1 % CREA Apply topically daily. to face   tadalafil (CIALIS) 20 MG tablet Take 0.5-1 tablets (10-20 mg total) by mouth every other day as needed for erectile dysfunction.     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   Occupation: Driving school bus    Comment: and other part time work  Tobacco Use   Smoking status: Never    Passive exposure: Past   Smokeless tobacco: Never  Vaping Use   Vaping status: Never Used  Substance and Sexual Activity   Alcohol use: No   Drug use: No   Sexual activity: Not on file  Other Topics Concern   Not on file  Social  History Narrative   Not on file   Social Determinants of Health   Financial Resource Strain: Not on file  Food Insecurity: Not on file  Transportation Needs: Not on file  Physical Activity: Not on file  Stress: Not on file  Social Connections: Not on file     Family History: The patient's family history includes Arthritis in his mother; Breast cancer in his paternal grandmother and another family member; Cancer in his paternal aunt and sister; Celiac disease in his paternal uncle; Coronary artery disease in his maternal grandfather and paternal grandfather; Dementia in his father; Heart attack in his father and mother; Heart disease in his father; Hodgkin's lymphoma in his sister; Other in his paternal uncle and sister; Stroke in his  maternal grandmother; Suicidality in his paternal grandmother. There is no history of Diabetes, Hypertension, Colon polyps, Colon cancer, Rectal cancer, Stomach cancer, or Esophageal cancer.  ROS:   Please see the history of present illness.     All other systems reviewed and are negative.  EKGs/Labs/Other Studies Reviewed:    The following studies were reviewed today:   EKG:  EKG not ordered today.   Recent Labs: 12/03/2021: ALT 15; Hemoglobin 15.3; Platelets 257.0 07/11/2022: BUN 16; Creatinine, Ser 1.03; Potassium 4.0; Sodium 140  Recent Lipid Panel    Component Value Date/Time   CHOL 161 12/03/2021 0957   TRIG 67.0 12/03/2021 0957   HDL 49.50 12/03/2021 0957   CHOLHDL 3 12/03/2021 0957   VLDL 13.4 12/03/2021 0957   LDLCALC 99 12/03/2021 0957     Risk Assessment/Calculations:      Physical Exam:    VS:  BP (!) 142/62 (BP Location: Left Arm, Patient Position: Sitting, Cuff Size: Normal)   Pulse (!) 58   Ht 5' 7.5" (1.715 m)   Wt 183 lb (83 kg)   SpO2 97%   BMI 28.24 kg/m     Wt Readings from Last 3 Encounters:  08/15/22 183 lb (83 kg)  07/11/22 180 lb 6.4 oz (81.8 kg)  02/06/22 178 lb (80.7 kg)     GEN:  Well nourished, well developed in no acute distress HEENT: Normal NECK: No JVD; No carotid bruits CARDIAC: RRR, no murmurs, rubs, gallops RESPIRATORY:  Clear to auscultation without rales, wheezing or rhonchi  ABDOMEN: Soft, non-tender, non-distended MUSCULOSKELETAL:  No edema; left lower extremity varicose veins noted SKIN: Warm and dry NEUROLOGIC:  Alert and oriented x 3 PSYCHIATRIC:  Normal affect   ASSESSMENT:    1. Coronary artery disease involving native coronary artery of native heart, unspecified whether angina present   2. Hyperlipidemia, unspecified hyperlipidemia type    PLAN:    In order of problems listed above:  CAD, severe proximal Lcx stenosis on CCTA.  Echocardiogram 2021 EF 55 to 60%.  Repeat echocardiogram.  Schedule left heart  cath and possible PCI. Hyperlipidemia, start Lipitor 20 mg daily.   Follow-up in 6 weeks.  Informed Consent   Shared Decision Making/Informed Consent The risks [stroke (1 in 1000), death (1 in 1000), kidney failure [usually temporary] (1 in 500), bleeding (1 in 200), allergic reaction [possibly serious] (1 in 200)], benefits (diagnostic support and management of coronary artery disease) and alternatives of a cardiac catheterization were discussed in detail with Willie Griffin and he is willing to proceed.       Shared Decision Making/Informed Consent       Medication Adjustments/Labs and Tests Ordered: Current medicines are reviewed at length with the patient today.  Concerns  regarding medicines are outlined above.  Orders Placed This Encounter  Procedures   CBC   Basic Metabolic Panel (BMET)   ECHOCARDIOGRAM COMPLETE   Meds ordered this encounter  Medications   atorvastatin (LIPITOR) 20 MG tablet    Sig: Take 1 tablet (20 mg total) by mouth daily.    Dispense:  90 tablet    Refill:  3    Patient Instructions  Medication Instructions:   START Atorvastatin - Take one tablet ( 20mg ) by mouth daily.   *If you need a refill on your cardiac medications before your next appointment, please call your pharmacy*   Lab Work:  Your physician recommends you go to the medical mall for labs. BMP / CBC  If you have labs (blood work) drawn today and your tests are completely normal, you will receive your results only by: MyChart Message (if you have MyChart) OR A paper copy in the mail If you have any lab test that is abnormal or we need to change your treatment, we will call you to review the results.   Testing/Procedures:  Your physician has requested that you have an echocardiogram. Echocardiography is a painless test that uses sound waves to create images of your heart. It provides your doctor with information about the size and shape of your heart and how well your heart's  chambers and valves are working. This procedure takes approximately one hour. There are no restrictions for this procedure. Please do NOT wear cologne, perfume, aftershave, or lotions (deodorant is allowed). Please arrive 15 minutes prior to your appointment time.   [facility] A DEPT OF [facility] AT [facility] [address] [place] [address] Dept: [phone] Loc: [phone]  Willie Griffin  08/15/2022  You are scheduled for a Cardiac Catheterization on Thursday, July 25 with Dr. Lorine Bears.  1. Please arrive at the [facility] Vascular Center Entrance of [facility], [address], [place] [address] at 11:30 AM (This is 1 hour(s) prior to your procedure time).  Proceed to the Check-In Desk directly inside the entrance.  Procedure Parking: Use the entrance off of the [address] side of the hospital. Turn right upon entering and follow the driveway to parking that is directly in front of the Heart & Vascular Center. There is no valet parking available at this entrance, however there is an awning directly in front of the Heart & Vascular Center for drop off/ pick up for patients.  Special note: Every effort is made to have your procedure done on time. Please understand that emergencies sometimes delay scheduled procedures.  2. Diet: Do not eat solid foods after midnight.  The patient may have clear liquids until 5am upon the day of the procedure.  3. Labs: You will need to have blood drawn TODAY  4. Medication instructions in preparation for your procedure:  HOLD tadalafil (CIALIS) 20 MG tablet, Take 0.5-1 tablets (10-20 mg total) by mouth every other day as needed for erectile dysfunction.  On the morning of your procedure, take your Aspirin 81 mg and any morning medicines NOT listed above.  You may use sips of water.  5. Plan to go home the same day, you will only stay overnight if medically  necessary. 6. Bring a current list of your medications and current insurance cards. 7. You MUST have a responsible person to drive you home. 8. Someone MUST be with you the first 24 hours after  you arrive home or your discharge will be delayed. 9. Please wear clothes that are easy to get on and off and wear slip-on shoes.  Thank you for allowing [place] to care for you!   -- [facility]   Follow-Up: At [facility], you and your health needs are our priority.  As part of our continuing mission to provide you with exceptional heart care, we have created designated Provider Care Teams.  These Care Teams include your primary Cardiologist (physician) and Advanced Practice Providers (APPs -  Physician Assistants and Nurse Practitioners) who all work together to provide you with the care you need, when you need it.  We recommend signing up for the patient portal called "MyChart".  Sign up information is provided on this After Visit Summary.  MyChart is used to connect with patients for Virtual Visits (Telemedicine).  Patients are able to view lab/test results, encounter notes, upcoming appointments, etc.  Non-urgent messages can be sent to your provider as well.   To learn more about what you can do with MyChart, go to [email or web address].    Your next appointment:   4 - 6 week(s)  Provider:   You may see Debbe Odea, MD or one of the following Advanced Practice Providers on your designated Care Team:   Nicolasa Ducking, NP Eula Listen, PA-C Cadence Fransico Michael, PA-C Charlsie Quest, NP   Signed, Debbe Odea, MD  08/15/2022 3:54 PM    [facility]

## 2022-08-15 NOTE — Patient Instructions (Signed)
Medication Instructions:   START Atorvastatin - Take one tablet ( 20mg ) by mouth daily.   *If you need a refill on your cardiac medications before your next appointment, please call your pharmacy*   Lab Work:  Your physician recommends you go to the medical mall for labs. BMP / CBC  If you have labs (blood work) drawn today and your tests are completely normal, you will receive your results only by: MyChart Message (if you have MyChart) OR A paper copy in the mail If you have any lab test that is abnormal or we need to change your treatment, we will call you to review the results.   Testing/Procedures:  Your physician has requested that you have an echocardiogram. Echocardiography is a painless test that uses sound waves to create images of your heart. It provides your doctor with information about the size and shape of your heart and how well your heart's chambers and valves are working. This procedure takes approximately one hour. There are no restrictions for this procedure. Please do NOT wear cologne, perfume, aftershave, or lotions (deodorant is allowed). Please arrive 15 minutes prior to your appointment time.   [facility] [address] [place] [address] Dept: [phone] Loc: [phone]  Mathias Bogacki Ulin  08/15/2022  You are scheduled for a Cardiac Catheterization on Thursday, July 25 with Dr. Lorine Bears.  1. Please arrive at the [facility] Center Entrance of [facility], [address], [place] [address] at 11:30 AM (This is 1 hour(s) prior to your procedure time).  Proceed to the Check-In Desk directly inside the entrance.  Procedure Parking: Use the entrance off of the [address] side of the hospital. Turn right upon entering and follow the driveway to parking that is directly in front of the Heart & Vascular Center. There is no valet parking  available at this entrance, however there is an awning directly in front of the Heart & Vascular Center for drop off/ pick up for patients.  Special note: Every effort is made to have your procedure done on time. Please understand that emergencies sometimes delay scheduled procedures.  2. Diet: Do not eat solid foods after midnight.  The patient may have clear liquids until 5am upon the day of the procedure.  3. Labs: You will need to have blood drawn TODAY  4. Medication instructions in preparation for your procedure:  HOLD tadalafil (CIALIS) 20 MG tablet, Take 0.5-1 tablets (10-20 mg total) by mouth every other day as needed for erectile dysfunction.  On the morning of your procedure, take your Aspirin 81 mg and any morning medicines NOT listed above.  You may use sips of water.  5. Plan to go home the same day, you will only stay overnight if medically necessary. 6. Bring a current list of your medications and current insurance cards. 7. You MUST have a responsible person to drive you home. 8. Someone MUST be with you the first 24 hours after you arrive home or your discharge will be delayed. 9. Please wear clothes that are easy to get on and off and wear slip-on shoes.  Thank you for allowing [place] to care for you!   -- [place] [facility]   Follow-Up: At [facility], you and your health needs are our priority.  As part of our continuing mission to provide you with exceptional heart care, we  have created designated Provider Care Teams.  These Care Teams include your primary Cardiologist (physician) and Advanced Practice Providers (APPs -  Physician Assistants and Nurse Practitioners) who all work together to provide you with the care you need, when you need it.  We recommend signing up for the patient portal called "MyChart".  Sign up information is provided on this After Visit Summary.  MyChart is used to connect with patients for Virtual Visits  (Telemedicine).  Patients are able to view lab/test results, encounter notes, upcoming appointments, etc.  Non-urgent messages can be sent to your provider as well.   To learn more about what you can do with MyChart, go to [email or web address].    Your next appointment:   4 - 6 week(s)  Provider:   You may see Debbe Odea, MD or one of the following Advanced Practice Providers on your designated Care Team:   Nicolasa Ducking, NP Eula Listen, PA-C Cadence Fransico Michael, PA-C Charlsie Quest, NP

## 2022-08-15 NOTE — H&P (View-Only) (Signed)
Cardiology Office Note:    Date:  08/15/2022   ID:  Willie Griffin, DOB 1955/08/09, MRN [id number]  PCP:  Karie Schwalbe, MD  [facility] Cardiologist:  Debbe Odea, MD  [facility] Electrophysiologist:  None   Referring MD: Karie Schwalbe, MD   Chief Complaint  Patient presents with   Follow-up    Discuss cardiac test results.    History of Present Illness:    Willie Griffin is a 67 y.o. male with a hx of hyperlipidemia, GERD, coronary calcification, hypothyroidism who presents for follow-up   Patient was last seen due to symptoms of chest pain.  Coronary CT was obtained to evaluate CAD.  Coronary CTA performed yesterday showed significant stenosis in the proximal left circumflex.  Patient is still hesitant regarding starting statin.  No new concerns today.  Prior notes Coronary calcium score 01/2020 was 112, 62nd percentile. Echocardiogram 12/2019 showed normal systolic and diastolic function.    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 Swallow whole.   atorvastatin (LIPITOR) 20 MG tablet Take 1 tablet (20 mg total) by mouth daily.   Cyanocobalamin (VITAMIN B-12 PO) Take 2 Doses by mouth daily.   EPIPEN 2-PAK 0.3 MG/0.3ML SOAJ injection INJECT 0.3 MG INTRAMUSCULARLY ONCE AS NEEDED FOR UP TO 1 DOSE   hydrocortisone 2.5 % cream Use rectally twice daily as neeed   levothyroxine (SYNTHROID) 25 MCG tablet TAKE 1 TABLET BY MOUTH EVERY DAY BEFORE BREAKFAST   naproxen sodium (ALEVE)  220 MG tablet Take 220 mg by mouth as needed.   omeprazole (PRILOSEC) 20 MG capsule TAKE 1 CAPSULE BY MOUTH EVERY DAY (Patient taking differently: Take 20 mg by mouth every other day.)   OVER THE COUNTER MEDICATION Take 2 capsules by mouth daily. Synoviox includes MSM, turmeric, and hyaluronic acid   SOOLANTRA 1 % CREA Apply topically daily. to face   tadalafil (CIALIS) 20 MG tablet Take 0.5-1 tablets (10-20 mg total) by mouth every other day as needed for erectile dysfunction.     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   Occupation: Driving school bus    Comment: and other part time work  Tobacco Use   Smoking status: Never    Passive exposure: Past   Smokeless tobacco: Never  Vaping Use   Vaping status: Never Used  Substance and Sexual Activity   Alcohol use: No   Drug use: No   Sexual activity: Not on file  Other Topics Concern   Not on file  Social  History Narrative   Not on file   Social Determinants of Health   Financial Resource Strain: Not on file  Food Insecurity: Not on file  Transportation Needs: Not on file  Physical Activity: Not on file  Stress: Not on file  Social Connections: Not on file     Family History: The patient's family history includes Arthritis in his mother; Breast cancer in his paternal grandmother and another family member; Cancer in his paternal aunt and sister; Celiac disease in his paternal uncle; Coronary artery disease in his maternal grandfather and paternal grandfather; Dementia in his father; Heart attack in his father and mother; Heart disease in his father; Hodgkin's lymphoma in his sister; Other in his paternal uncle and sister; Stroke in his  maternal grandmother; Suicidality in his paternal grandmother. There is no history of Diabetes, Hypertension, Colon polyps, Colon cancer, Rectal cancer, Stomach cancer, or Esophageal cancer.  ROS:   Please see the history of present illness.     All other systems reviewed and are negative.  EKGs/Labs/Other Studies Reviewed:    The following studies were reviewed today:   EKG:  EKG not ordered today.   Recent Labs: 12/03/2021: ALT 15; Hemoglobin 15.3; Platelets 257.0 07/11/2022: BUN 16; Creatinine, Ser 1.03; Potassium 4.0; Sodium 140  Recent Lipid Panel    Component Value Date/Time   CHOL 161 12/03/2021 0957   TRIG 67.0 12/03/2021 0957   HDL 49.50 12/03/2021 0957   CHOLHDL 3 12/03/2021 0957   VLDL 13.4 12/03/2021 0957   LDLCALC 99 12/03/2021 0957     Risk Assessment/Calculations:      Physical Exam:    VS:  BP (!) 142/62 (BP Location: Left Arm, Patient Position: Sitting, Cuff Size: Normal)   Pulse (!) 58   Ht 5' 7.5" (1.715 m)   Wt 183 lb (83 kg)   SpO2 97%   BMI 28.24 kg/m     Wt Readings from Last 3 Encounters:  08/15/22 183 lb (83 kg)  07/11/22 180 lb 6.4 oz (81.8 kg)  02/06/22 178 lb (80.7 kg)     GEN:  Well nourished, well developed in no acute distress HEENT: Normal NECK: No JVD; No carotid bruits CARDIAC: RRR, no murmurs, rubs, gallops RESPIRATORY:  Clear to auscultation without rales, wheezing or rhonchi  ABDOMEN: Soft, non-tender, non-distended MUSCULOSKELETAL:  No edema; left lower extremity varicose veins noted SKIN: Warm and dry NEUROLOGIC:  Alert and oriented x 3 PSYCHIATRIC:  Normal affect   ASSESSMENT:    1. Coronary artery disease involving native coronary artery of native heart, unspecified whether angina present   2. Hyperlipidemia, unspecified hyperlipidemia type    PLAN:    In order of problems listed above:  CAD, severe proximal Lcx stenosis on CCTA.  Echocardiogram 2021 EF 55 to 60%.  Repeat echocardiogram.  Schedule left heart  cath and possible PCI. Hyperlipidemia, start Lipitor 20 mg daily.   Follow-up in 6 weeks.  Informed Consent   Shared Decision Making/Informed Consent The risks [stroke (1 in 1000), death (1 in 1000), kidney failure [usually temporary] (1 in 500), bleeding (1 in 200), allergic reaction [possibly serious] (1 in 200)], benefits (diagnostic support and management of coronary artery disease) and alternatives of a cardiac catheterization were discussed in detail with Willie Griffin and he is willing to proceed.       Shared Decision Making/Informed Consent       Medication Adjustments/Labs and Tests Ordered: Current medicines are reviewed at length with the patient today.  Concerns  regarding medicines are outlined above.  Orders Placed This Encounter  Procedures   CBC   Basic Metabolic Panel (BMET)   ECHOCARDIOGRAM COMPLETE   Meds ordered this encounter  Medications   atorvastatin (LIPITOR) 20 MG tablet    Sig: Take 1 tablet (20 mg total) by mouth daily.    Dispense:  90 tablet    Refill:  3    Patient Instructions  Medication Instructions:   START Atorvastatin - Take one tablet ( 20mg ) by mouth daily.   *If you need a refill on your cardiac medications before your next appointment, please call your pharmacy*   Lab Work:  Your physician recommends you go to the medical mall for labs. BMP / CBC  If you have labs (blood work) drawn today and your tests are completely normal, you will receive your results only by: MyChart Message (if you have MyChart) OR A paper copy in the mail If you have any lab test that is abnormal or we need to change your treatment, we will call you to review the results.   Testing/Procedures:  Your physician has requested that you have an echocardiogram. Echocardiography is a painless test that uses sound waves to create images of your heart. It provides your doctor with information about the size and shape of your heart and how well your heart's  chambers and valves are working. This procedure takes approximately one hour. There are no restrictions for this procedure. Please do NOT wear cologne, perfume, aftershave, or lotions (deodorant is allowed). Please arrive 15 minutes prior to your appointment time.   [facility] [address] [place] [address] Dept: [phone] Loc: [phone]  Willie Griffin  08/15/2022  You are scheduled for a Cardiac Catheterization on Thursday, July 25 with Dr. Lorine Bears.  1. Please arrive at the [facility] Vascular Center Entrance of [facility], [address] [place], [place] [address] at 11:30 AM (This is 1 hour(s) prior to your procedure time).  Proceed to the Check-In Desk directly inside the entrance.  Procedure Parking: Use the entrance off of the [address] side of the hospital. Turn right upon entering and follow the driveway to parking that is directly in front of the Heart & Vascular Center. There is no valet parking available at this entrance, however there is an awning directly in front of the Heart & Vascular Center for drop off/ pick up for patients.  Special note: Every effort is made to have your procedure done on time. Please understand that emergencies sometimes delay scheduled procedures.  2. Diet: Do not eat solid foods after midnight.  The patient may have clear liquids until 5am upon the day of the procedure.  3. Labs: You will need to have blood drawn TODAY  4. Medication instructions in preparation for your procedure:  HOLD tadalafil (CIALIS) 20 MG tablet, Take 0.5-1 tablets (10-20 mg total) by mouth every other day as needed for erectile dysfunction.  On the morning of your procedure, take your Aspirin 81 mg and any morning medicines NOT listed above.  You may use sips of water.  5. Plan to go home the same day, you will only stay overnight if medically  necessary. 6. Bring a current list of your medications and current insurance cards. 7. You MUST have a responsible person to drive you home. 8. Someone MUST be with you the first 24 hours after  you arrive home or your discharge will be delayed. 9. Please wear clothes that are easy to get on and off and wear slip-on shoes.  Thank you for allowing [place] to care for you!   -- [place] [facility]   Follow-Up: At [facility], you and your health needs are our priority.  As part of our continuing mission to provide you with exceptional heart care, we have created designated Provider Care Teams.  These Care Teams include your primary Cardiologist (physician) and Advanced Practice Providers (APPs -  Physician Assistants and Nurse Practitioners) who all work together to provide you with the care you need, when you need it.  We recommend signing up for the patient portal called "MyChart".  Sign up information is provided on this After Visit Summary.  MyChart is used to connect with patients for Virtual Visits (Telemedicine).  Patients are able to view lab/test results, encounter notes, upcoming appointments, etc.  Non-urgent messages can be sent to your provider as well.   To learn more about what you can do with MyChart, go to [email or web address].    Your next appointment:   4 - 6 week(s)  Provider:   You may see Debbe Odea, MD or one of the following Advanced Practice Providers on your designated Care Team:   Nicolasa Ducking, NP Eula Listen, PA-C Cadence Fransico Michael, PA-C Charlsie Quest, NP   Signed, Debbe Odea, MD  08/15/2022 3:54 PM    [facility]

## 2022-08-21 ENCOUNTER — Other Ambulatory Visit: Payer: Self-pay

## 2022-08-21 ENCOUNTER — Encounter
Admission: RE | Disposition: A | Discharge status: Home / Self Care | Payer: Self-pay | Source: Home / Self Care | Attending: Cardiovascular Disease

## 2022-08-21 ENCOUNTER — Ambulatory Visit
Admission: RE | Admit: 2022-08-21 | Discharge: 2022-08-22 | Disposition: A | Discharge status: Home / Self Care | Payer: Medicare Other | Attending: Cardiovascular Disease | Admitting: Cardiovascular Disease

## 2022-08-21 ENCOUNTER — Encounter: Payer: Self-pay | Admitting: Cardiovascular Disease

## 2022-08-21 DIAGNOSIS — E039 Hypothyroidism, unspecified: Secondary | ICD-10-CM | POA: Diagnosis present

## 2022-08-21 DIAGNOSIS — I2510 Atherosclerotic heart disease of native coronary artery without angina pectoris: Secondary | ICD-10-CM

## 2022-08-21 DIAGNOSIS — R931 Abnormal findings on diagnostic imaging of heart and coronary circulation: Secondary | ICD-10-CM

## 2022-08-21 DIAGNOSIS — Z7722 Contact with and (suspected) exposure to environmental tobacco smoke (acute) (chronic): Secondary | ICD-10-CM | POA: Diagnosis not present

## 2022-08-21 DIAGNOSIS — K219 Gastro-esophageal reflux disease without esophagitis: Secondary | ICD-10-CM | POA: Diagnosis present

## 2022-08-21 DIAGNOSIS — Z8249 Family history of ischemic heart disease and other diseases of the circulatory system: Secondary | ICD-10-CM | POA: Insufficient documentation

## 2022-08-21 DIAGNOSIS — E785 Hyperlipidemia, unspecified: Secondary | ICD-10-CM | POA: Diagnosis present

## 2022-08-21 DIAGNOSIS — I25118 Atherosclerotic heart disease of native coronary artery with other forms of angina pectoris: Secondary | ICD-10-CM | POA: Diagnosis not present

## 2022-08-21 DIAGNOSIS — I2089 Other forms of angina pectoris: Secondary | ICD-10-CM | POA: Insufficient documentation

## 2022-08-21 HISTORY — PX: LEFT HEART CATH AND CORONARY ANGIOGRAPHY: CATH118249

## 2022-08-21 HISTORY — PX: CORONARY STENT INTERVENTION: CATH118234

## 2022-08-21 LAB — POCT ACTIVATED CLOTTING TIME
Activated Clotting Time: 238 seconds
Activated Clotting Time: 305 seconds

## 2022-08-21 SURGERY — LEFT HEART CATH AND CORONARY ANGIOGRAPHY
Anesthesia: Moderate Sedation

## 2022-08-21 MED ORDER — HEPARIN SODIUM (PORCINE) 1000 UNIT/ML IJ SOLN
INTRAMUSCULAR | Status: AC
  Filled 2022-08-21: qty 10

## 2022-08-21 MED ORDER — MIDAZOLAM HCL 2 MG/2ML IJ SOLN
INTRAMUSCULAR | Status: AC
  Filled 2022-08-21: qty 2

## 2022-08-21 MED ORDER — CLOPIDOGREL BISULFATE 75 MG PO TABS
75.0000 mg | ORAL_TABLET | Freq: Every day | ORAL | Status: DC
  Administered 2022-08-22: 75 mg via ORAL
  Filled 2022-08-21: qty 1

## 2022-08-21 MED ORDER — LEVOTHYROXINE SODIUM 25 MCG PO TABS
25.0000 ug | ORAL_TABLET | Freq: Every day | ORAL | Status: DC
  Administered 2022-08-22: 25 ug via ORAL
  Filled 2022-08-21: qty 1

## 2022-08-21 MED ORDER — SODIUM CHLORIDE 0.9% FLUSH
3.0000 mL | Freq: Two times a day (BID) | INTRAVENOUS | Status: DC
  Administered 2022-08-22: 3 mL via INTRAVENOUS

## 2022-08-21 MED ORDER — ACETAMINOPHEN 325 MG PO TABS: 650.0000 mg | ORAL_TABLET | ORAL | Status: DC | PRN

## 2022-08-21 MED ORDER — NITROGLYCERIN 1 MG/10 ML FOR IR/CATH LAB
INTRA_ARTERIAL | Status: DC | PRN
  Administered 2022-08-21: 200 ug via INTRACORONARY

## 2022-08-21 MED ORDER — CLOPIDOGREL BISULFATE 300 MG PO TABS
ORAL_TABLET | ORAL | Status: AC
  Filled 2022-08-21: qty 2

## 2022-08-21 MED ORDER — SODIUM CHLORIDE 0.9% FLUSH: 3.0000 mL | INTRAVENOUS | Status: DC | PRN

## 2022-08-21 MED ORDER — IOHEXOL 300 MG/ML  SOLN
INTRAMUSCULAR | Status: DC | PRN
  Administered 2022-08-21: 125 mL

## 2022-08-21 MED ORDER — ASPIRIN 81 MG PO TBEC
81.0000 mg | DELAYED_RELEASE_TABLET | Freq: Every day | ORAL | Status: DC
  Administered 2022-08-22: 81 mg via ORAL
  Filled 2022-08-21: qty 1

## 2022-08-21 MED ORDER — VERAPAMIL HCL 2.5 MG/ML IV SOLN
INTRAVENOUS | Status: AC
  Filled 2022-08-21: qty 2

## 2022-08-21 MED ORDER — SODIUM CHLORIDE 0.9 % WEIGHT BASED INFUSION: 1.0000 mL/kg/h | INTRAVENOUS | Status: DC

## 2022-08-21 MED ORDER — VERAPAMIL HCL 2.5 MG/ML IV SOLN
INTRAVENOUS | Status: DC | PRN
  Administered 2022-08-21: 2.5 mg via INTRA_ARTERIAL

## 2022-08-21 MED ORDER — PANTOPRAZOLE SODIUM 40 MG PO TBEC
40.0000 mg | DELAYED_RELEASE_TABLET | Freq: Every day | ORAL | Status: DC
  Administered 2022-08-21 – 2022-08-22 (×2): 40 mg via ORAL
  Filled 2022-08-21 (×2): qty 1

## 2022-08-21 MED ORDER — FENTANYL CITRATE (PF) 100 MCG/2ML IJ SOLN
INTRAMUSCULAR | Status: AC
  Filled 2022-08-21: qty 2

## 2022-08-21 MED ORDER — CLOPIDOGREL BISULFATE 75 MG PO TABS
ORAL_TABLET | ORAL | Status: DC | PRN
  Administered 2022-08-21: 600 mg via ORAL

## 2022-08-21 MED ORDER — ONDANSETRON HCL 4 MG/2ML IJ SOLN: 4.0000 mg | Freq: Four times a day (QID) | INTRAMUSCULAR | Status: DC | PRN

## 2022-08-21 MED ORDER — SODIUM CHLORIDE 0.9 % WEIGHT BASED INFUSION
1.0000 mL/kg/h | INTRAVENOUS | Status: AC
  Administered 2022-08-21: 1 mL/kg/h via INTRAVENOUS

## 2022-08-21 MED ORDER — ATORVASTATIN CALCIUM 20 MG PO TABS
20.0000 mg | ORAL_TABLET | Freq: Every day | ORAL | Status: DC
  Administered 2022-08-21 – 2022-08-22 (×2): 20 mg via ORAL
  Filled 2022-08-21 (×2): qty 1

## 2022-08-21 MED ORDER — ASPIRIN 81 MG PO CHEW
CHEWABLE_TABLET | ORAL | Status: AC
  Filled 2022-08-21: qty 2

## 2022-08-21 MED ORDER — LIDOCAINE HCL 1 % IJ SOLN
INTRAMUSCULAR | Status: AC
  Filled 2022-08-21: qty 20

## 2022-08-21 MED ORDER — ASPIRIN 81 MG PO CHEW
CHEWABLE_TABLET | ORAL | Status: DC | PRN
  Administered 2022-08-21: 162 mg via ORAL

## 2022-08-21 MED ORDER — SODIUM CHLORIDE 0.9 % IV SOLN: 250.0000 mL | INTRAVENOUS | Status: DC | PRN

## 2022-08-21 MED ORDER — HEPARIN SODIUM (PORCINE) 1000 UNIT/ML IJ SOLN
INTRAMUSCULAR | Status: DC | PRN
  Administered 2022-08-21 (×2): 4000 [IU] via INTRAVENOUS
  Administered 2022-08-21: 2000 [IU] via INTRAVENOUS

## 2022-08-21 MED ORDER — HEPARIN (PORCINE) IN NACL 1000-0.9 UT/500ML-% IV SOLN
INTRAVENOUS | Status: AC
  Filled 2022-08-21: qty 1000

## 2022-08-21 MED ORDER — SODIUM CHLORIDE 0.9 % WEIGHT BASED INFUSION
3.0000 mL/kg/h | INTRAVENOUS | Status: DC
  Administered 2022-08-21: 3 mL/kg/h via INTRAVENOUS

## 2022-08-21 MED ORDER — FENTANYL CITRATE (PF) 100 MCG/2ML IJ SOLN
INTRAMUSCULAR | Status: DC | PRN
  Administered 2022-08-21 (×3): 25 ug via INTRAVENOUS

## 2022-08-21 MED ORDER — HEPARIN (PORCINE) IN NACL 2000-0.9 UNIT/L-% IV SOLN
INTRAVENOUS | Status: DC | PRN
  Administered 2022-08-21: 1000 mL

## 2022-08-21 MED ORDER — MIDAZOLAM HCL 2 MG/2ML IJ SOLN
INTRAMUSCULAR | Status: DC | PRN
  Administered 2022-08-21 (×3): 1 mg via INTRAVENOUS

## 2022-08-21 MED ORDER — NITROGLYCERIN 1 MG/10 ML FOR IR/CATH LAB
INTRA_ARTERIAL | Status: AC
  Filled 2022-08-21: qty 10

## 2022-08-21 SURGICAL SUPPLY — 21 items
BALLN ~~LOC~~ TREK NEO RX 3.0X12 (BALLOONS) ×2
BALLOON ~~LOC~~ TREK NEO RX 3.0X12 (BALLOONS) IMPLANT
CANNULA 5F STIFF (CANNULA) IMPLANT
CATH INFINITI 5FR JK (CATHETERS) IMPLANT
CATH LAUNCHER 6FR EBU3.5 (CATHETERS) IMPLANT
DEVICE RAD TR BAND REGULAR (VASCULAR PRODUCTS) IMPLANT
DRAPE BRACHIAL (DRAPES) IMPLANT
GLIDESHEATH SLEND SS 6F .021 (SHEATH) IMPLANT
GUIDEWIRE INQWIRE 1.5J.035X260 (WIRE) IMPLANT
INQWIRE 1.5J .035X260CM (WIRE) ×2
KIT ENCORE 26 ADVANTAGE (KITS) IMPLANT
PACK CARDIAC CATH (CUSTOM PROCEDURE TRAY) ×3 IMPLANT
PROTECTION STATION PRESSURIZED (MISCELLANEOUS) ×2
SET ATX-X65L (MISCELLANEOUS) IMPLANT
SHEATH AVANTI 5FR X 11CM (SHEATH) IMPLANT
STATION PROTECTION PRESSURIZED (MISCELLANEOUS) IMPLANT
STENT ONYX FRONTIER 2.75X22 (Permanent Stent) IMPLANT
STENT ONYX FRONTIER 3.0X15 (Permanent Stent) IMPLANT
TUBING CIL FLEX 10 FLL-RA (TUBING) IMPLANT
WIRE GUIDERIGHT .035X150 (WIRE) IMPLANT
WIRE RUNTHROUGH .014X180CM (WIRE) IMPLANT

## 2022-08-21 NOTE — Plan of Care (Signed)
  Problem: Activity: Goal: Ability to return to baseline activity level will improve Outcome: Progressing   Problem: Cardiovascular: Goal: Ability to achieve and maintain adequate cardiovascular perfusion will improve Outcome: Progressing Goal: Vascular access site(s) Level 0-1 will be maintained Outcome: Progressing   

## 2022-08-21 NOTE — Discharge Instructions (Signed)
Please review your medication list and medications carefully as there have been some changes. Please contact our office at [phone] if you have any questions or concerns about your cardiac catheterization or medications.

## 2022-08-21 NOTE — Interval H&P Note (Signed)
Cath Lab Visit (complete for each Cath Lab visit)  Clinical Evaluation Leading to the Procedure:   ACS: No.  Non-ACS:    Anginal Classification: CCS III  Anti-ischemic medical therapy: No Therapy  Non-Invasive Test Results: Intermediate-risk stress test findings: cardiac mortality 1-3%/year  Prior CABG: No previous CABG      History and Physical Interval Note:  08/21/2022 1:02 PM  Willie Griffin  has presented today for surgery, with the diagnosis of L Cath   Chest pain   Abnormal CTA.  The various methods of treatment have been discussed with the patient and family. After consideration of risks, benefits and other options for treatment, the patient has consented to  Procedure(s): LEFT HEART CATH AND CORONARY ANGIOGRAPHY (Left) as a surgical intervention.  The patient's history has been reviewed, patient examined, no change in status, stable for surgery.  I have reviewed the patient's chart and labs.  Questions were answered to the patient's satisfaction.     Lorine Bears

## 2022-08-22 ENCOUNTER — Encounter: Payer: Self-pay | Admitting: Cardiovascular Disease

## 2022-08-22 DIAGNOSIS — I25118 Atherosclerotic heart disease of native coronary artery with other forms of angina pectoris: Secondary | ICD-10-CM | POA: Diagnosis not present

## 2022-08-22 DIAGNOSIS — E785 Hyperlipidemia, unspecified: Secondary | ICD-10-CM | POA: Diagnosis present

## 2022-08-22 LAB — BASIC METABOLIC PANEL: Glucose, Bld: 115 mg/dL — ABNORMAL HIGH (ref 70–99)

## 2022-08-22 LAB — CBC: HCT: 40.5 % (ref 39.0–52.0)

## 2022-08-22 MED ORDER — PANTOPRAZOLE SODIUM 40 MG PO TBEC: 40.0000 mg | DELAYED_RELEASE_TABLET | Freq: Every day | ORAL | 5 refills | Status: DC

## 2022-08-22 MED ORDER — CLOPIDOGREL BISULFATE 75 MG PO TABS: 75.0000 mg | ORAL_TABLET | Freq: Every day | ORAL | 11 refills | Status: DC

## 2022-08-22 NOTE — Discharge Summary (Signed)
Discharge Summary    Patient ID: Willie Griffin  MRN: [id number], DOB/AGE: February 17, 1955 67 y.o.  Admit Date: 08/21/2022 Discharge Date: 08/22/2022  Primary Care Provider: Karie Schwalbe, MD Primary Cardiologist: Dr. Azucena Cecil, MD  Discharge Diagnoses    Principal Problem:   Coronary artery disease of native artery of native heart with stable angina pectoris [facility]) Active Problems:   Abnormal cardiac CT angiography   Hyperlipidemia LDL goal <70   Hypothyroidism   GERD (gastroesophageal reflux disease)   Allergies Allergies  Allergen Reactions   Bee Venom Other (See Comments)   Ciprofloxacin Other (See Comments)    Sensations like hot and cold are markedly pronounced and dermatologist told him he may be allergic.     History of Present Illness     67 year old male with history of CAD, HLD, hypothyroidism, and GERD who was recently seen in the office with precordial pain and underwent coronary CTA which showed significant stenosis, by CT FFR, and the LCx followed by diagnostic cath with PCI/overlapping DES x 2 to the LCx.  He was seen in late 2021 with precordial pain with recommendation to pursue echo and stress test.  Stress test was not performed due to financial constraints.  Echo from 12/2019 showed an EF of 55 to 60%, no regional wall motion abnormalities, mild LVH, normal LV diastolic function parameters, normal RV systolic function and ventricular cavity size, and trivial mitral regurgitation.  Calcium score in 01/2020 of 112 which was the 62nd percentile.  More recently, he was evaluated in the office in 06/2022 with precordial pain.  Coronary CTA on 08/14/2022 showed a calcium score of 175 which was the 65th percentile.  There was greater than 70% stenosis in the proximal LCx, 25 to 49% stenosis in the proximal LAD, and less than 25% stenosis in the RCA.  CT FFR was positive in the proximal LCx.  Cardiac catheterization was recommended.  Hospital Course     Consultants:  Cardiac rehab    He presented to [facility] on 08/21/2022 for planned cardiac cath which showed significant one-vessel CAD involving the LCx with moderate disease affecting the LAD and RCA.  He underwent successful PCI/DES to the proximal LCx.  A second overlapping stent was added to extend into the ostium of a large OM 2 which had 60% stenosis and also at the mid LCx had moderate diffuse disease.  Normal LV systolic function with mildly elevated LVEDP.  DAPT was recommended for a minimum of 6 months.  Post cath labs and vitals stable.   The patient's right radial arteriotomy site has been examined is healing well without issues at this time. The patient has been seen by Dr. Okey Dupre and felt to be stable for discharge today. All follow up appointments have been made. Discharge medications are listed below. Prescriptions have been reviewed with the patient and sent in to their pharmacy.     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  Per DOT guidelines, he will need an ETT in 3-6 months, post PCI.   CAD involving the native coronary arteries with unstable angina:  -Status post successful PCI/overlapping DES to the LCx  -DAPT with aspirin 81 mg and clopidogrel 75 mg daily without interruption for a minimum of 6 months dating back to date of PCI (08/21/2022)  -Outpatient echo is scheduled for 09/18/2022  -Aggressive risk factor modification and secondary prevention including atorvastatin  -Post cath instructions  -Cardiac rehab   HLD:  -LDL 99 from 11/2021 with goal  being at least less than 70  -Now on atorvastatin 20 mg  -Follow-up fasting lipid panel and LFT in the office  -LP(a) pending, if elevated consider addition of PCSK9 inhibitor   Hypothyroidism:  -PTA levothyroxine   GERD:  -Protonix in place of omeprazole given clopidogrel use  _____________  Discharge Vitals Blood pressure (!) 144/72, pulse 78, temperature 98.6 F (37 C), resp. rate 20, height 5\' 7"  (1.702 m), weight 81.6 kg, SpO2 98%.   Filed Weights   08/21/22 1210  Weight: 81.6 kg   Physical Exam   GEN: No acute distress.   Neck: No JVD.  Cardiac: RRR, no murmurs, rubs, or gallops.  Right radial arteriotomy site is without active bleeding, bruising, swelling, erythema, warmth, or tenderness to palpation.  Radial pulse 2+ proximal and distal to the arteriotomy site.  Respiratory: Clear to auscultation bilaterally.  GI: Soft, nontender, non-distended.   MS: No edema; No deformity.  Neuro:  Alert and oriented x 3; Nonfocal.  Psych: Normal affect.   Telemetry: SR, 60s bpm  Labs & Radiologic Studies    CBC Recent Labs    08/22/22 0647  WBC 6.9  HGB 14.3  HCT 40.5  MCV 88.0  PLT 195   Basic Metabolic Panel Recent Labs    78/29/56 0647  NA 136  K 4.2  CL 103  CO2 25  GLUCOSE 115*  BUN 22  CREATININE 1.10  CALCIUM 8.1*   Liver Function Tests No results for input(s): "AST", "ALT", "ALKPHOS", "BILITOT", "PROT", "ALBUMIN" in the last 72 hours. No results for input(s): "LIPASE", "AMYLASE" in the last 72 hours. Cardiac Enzymes No results for input(s): "CKTOTAL", "CKMB", "CKMBINDEX", "TROPONINI" in the last 72 hours. BNP Invalid input(s): "POCBNP" D-Dimer No results for input(s): "DDIMER" in the last 72 hours. Hemoglobin A1C No results for input(s): "HGBA1C" in the last 72 hours. Fasting Lipid Panel No results for input(s): "CHOL", "HDL", "LDLCALC", "TRIG", "CHOLHDL", "LDLDIRECT" in the last 72 hours. Thyroid Function Tests No results for input(s): "TSH", "T4TOTAL", "T3FREE", "THYROIDAB" in the last 72 hours.  Invalid input(s): "FREET3" _____________    Diagnostic Studies/Procedures   LHC 08/21/2022:   Prox RCA lesion is 20% stenosed.   Prox RCA to Mid RCA lesion is 40% stenosed.   Prox Cx to Mid Cx lesion is 80% stenosed.   2nd Mrg lesion is 60% stenosed.   Mid LAD lesion is 50% stenosed.   A drug-eluting stent was successfully placed using a STENT ONYX FRONTIER 3.0X15.   A drug-eluting  stent was successfully placed using a STENT ONYX FRONTIER 2.75X22.   Post intervention, there is a 0% residual stenosis.   Post intervention, there is a 0% residual stenosis.   The left ventricular systolic function is normal.   LV end diastolic pressure is mildly elevated.   The left ventricular ejection fraction is 55-65% by visual estimate.   1.  Significant one-vessel coronary artery disease involving the left circumflex with moderate disease affecting the LAD and RCA 2.  Normal LV systolic function with mildly elevated left ventricular end-diastolic pressure. 3.  Successful angioplasty and drug-eluting stent placement to the proximal left circumflex.  A second overlapping stent was added to extend into the ostium of the large OM 2 which had 60% stenosis and also the mid left circumflex had diffuse moderate disease.   Recommendations: Dual antiplatelet therapy for at least 6 months. Aggressive treatment of risk factors.    Diagnostic Dominance: Right Left Main  Vessel is angiographically normal.  Left Anterior Descending  Mid LAD lesion is 50% stenosed.    First Diagonal Branch  Vessel is small in size.    Ramus Intermedius  Vessel is small. Vessel is angiographically normal.    Left Circumflex  Prox Cx to Mid Cx lesion is 80% stenosed. The lesion is not complex (non high-C). The lesion was not previously treated .    First Obtuse Marginal Branch  Vessel is small in size.    Second Obtuse Marginal Branch  Vessel is large in size.  2nd Mrg lesion is 60% stenosed. The lesion is not complex (non high-C).    Third Obtuse Marginal Branch  Vessel is small in size.    Right Coronary Artery  Prox RCA lesion is 20% stenosed.  Prox RCA to Mid RCA lesion is 40% stenosed.    Right Posterior Descending Artery  Vessel is angiographically normal.    Right Posterior Atrioventricular Artery  Vessel is angiographically normal.    First Right Posterolateral Branch  Vessel is  small in size. Vessel is angiographically normal.    Second Right Posterolateral Branch  Vessel is angiographically normal.    Third Right Posterolateral Branch  Vessel is small in size. Vessel is angiographically normal.    Intervention   Prox Cx to Mid Cx lesion  Stent  Lesion length: 12 mm. CATH LAUNCHER 6FR EBU3.5 guide catheter was inserted. Lesion crossed with guidewire using a WIRE RUNTHROUGH .[id number]. Pre-stent angioplasty was not performed. A drug-eluting stent was successfully placed using a STENT ONYX FRONTIER 3.0X15. Maximum pressure: 12 atm. Inflation time: 20 sec. Post-stent angioplasty was performed using a BALLN Tenino TREK NEO RX 3.0X12. Maximum pressure: 14 atm. Inflation time: 15 sec.  Post-Intervention Lesion Assessment  The intervention was successful. Pre-interventional TIMI flow is 3. Post-intervention TIMI flow is 3. No complications occurred at this lesion.  There is a 0% residual stenosis post intervention.    2nd Mrg lesion  Stent  Lesion length: 10 mm. Pre-stent angioplasty was not performed. A drug-eluting stent was successfully placed using a STENT ONYX FRONTIER 2.75X22. Maximum pressure: 12 atm. Inflation time: 20 sec. Stent overlaps previously placed stent. The stent extended proximally to overlap with the first place stent. The stent balloon was pulled back a few millimeters and inflated to 18 atm to post dilate the overlap area. The patient had significant chest discomfort with every balloon inflation.  Post-Intervention Lesion Assessment  The intervention was successful. Pre-interventional TIMI flow is 3. Post-intervention TIMI flow is 3. No complications occurred at this lesion.  There is a 0% residual stenosis post intervention.     Left Heart  Left Ventricle The left ventricular size is normal. The left ventricular systolic function is normal. LV end diastolic pressure is mildly elevated. The left ventricular ejection fraction is 55-65% by visual  estimate. No regional wall motion abnormalities.   Coronary Diagrams  Diagnostic Dominance: Right  Intervention     _____________ _____________  Disposition   Pt is being discharged home today in good condition.  Follow-up Plans & Appointments     Follow-up Information     Debbe Odea, MD Follow up on 10/09/2022.   Specialties: Cardiology, Radiology Why: Appointment: 10:20 AM Contact information: [address] [place] [address] [phone]                Discharge Instructions     AMB Referral to Cardiac Rehabilitation - Phase II   Complete by: As directed    Diagnosis:  Coronary  Stents Stable Angina     After initial evaluation and assessments completed: Virtual Based Care may be provided alone or in conjunction with Phase 2 Cardiac Rehab based on patient barriers.: Yes   Intensive Cardiac Rehabilitation (ICR) [facility] location only OR Traditional Cardiac Rehabilitation (TCR) *If criteria for ICR are not met will enroll in TCR [facility] only): Yes   Diet - low sodium heart healthy   Complete by: As directed    Increase activity slowly   Complete by: As directed        Discharge Medications   Allergies as of 08/22/2022       Reactions   Bee Venom Other (See Comments)   Ciprofloxacin Other (See Comments)   Sensations like hot and cold are markedly pronounced and dermatologist told him he may be allergic.        Medication List     STOP taking these medications    naproxen sodium 220 MG tablet Commonly known as: ALEVE   omeprazole 20 MG capsule Commonly known as: PRILOSEC Replaced by: pantoprazole 40 MG tablet       TAKE these medications    aspirin EC 81 MG tablet Take 1 tablet (81 mg total) by mouth daily. Swallow whole.   atorvastatin 20 MG tablet Commonly known as: LIPITOR Take 1 tablet (20 mg total) by mouth daily.   clopidogrel 75 MG tablet Commonly known as: PLAVIX Take 1 tablet (75 mg total) by mouth daily with  breakfast.   EpiPen 2-Pak 0.3 MG/0.3ML Soaj injection Generic drug: EPINEPHrine INJECT 0.3 MG INTRAMUSCULARLY ONCE AS NEEDED FOR UP TO 1 DOSE   hydrocortisone 2.5 % cream Use rectally twice daily as neeed   levothyroxine 25 MCG tablet Commonly known as: SYNTHROID TAKE 1 TABLET BY MOUTH EVERY DAY BEFORE BREAKFAST   Magnesium 500 MG Caps Take by mouth.   OVER THE COUNTER MEDICATION Take 2 capsules by mouth daily. Synoviox includes MSM, turmeric, and hyaluronic acid   pantoprazole 40 MG tablet Commonly known as: PROTONIX Take 1 tablet (40 mg total) by mouth daily. Replaces: omeprazole 20 MG capsule   Soolantra 1 % Crea Generic drug: Ivermectin Apply topically daily. to face   tadalafil 20 MG tablet Commonly known as: CIALIS Take 0.5-1 tablets (10-20 mg total) by mouth every other day as needed for erectile dysfunction.   VITAMIN B-12 PO Take 2 Doses by mouth daily.           Aspirin prescribed at discharge?  Yes High Intensity Statin Prescribed? (Lipitor 40-80mg  or Crestor 20-40mg ): Yes Beta Blocker Prescribed? No: Bradycardia For EF <40%, was ACEI/ARB Prescribed? No: EF > 40% ADP Receptor Inhibitor Prescribed? (i.e. Plavix etc.-Includes Medically Managed Patients): Yes For EF <40%, Aldosterone Inhibitor Prescribed? No: EF > 40% Was EF assessed during THIS hospitalization? Yes Was Cardiac Rehab II ordered? (Included Medically managed Patients): Yes   Outstanding Labs/Studies   None  Duration of Discharge Encounter   Greater than 30 minutes including physician time.  Signed, Sondra Barges, PA-C [facility] Pager: [phone] 08/22/2022, 9:26 AM

## 2022-08-22 NOTE — Plan of Care (Signed)
Teaching complete 

## 2022-08-22 NOTE — Plan of Care (Signed)
  Problem: Education: Goal: Understanding of CV disease, CV risk reduction, and recovery process will improve Outcome: Progressing Goal: Individualized Educational Video(s) Outcome: Progressing   Problem: Activity: Goal: Ability to return to baseline activity level will improve Outcome: Progressing   Problem: Cardiovascular: Goal: Ability to achieve and maintain adequate cardiovascular perfusion will improve Outcome: Progressing Goal: Vascular access site(s) Level 0-1 will be maintained Outcome: Progressing   Problem: Education: Goal: Knowledge of General Education information will improve Description: Including pain rating scale, medication(s)/side effects and non-pharmacologic comfort measures Outcome: Progressing   Problem: Health Behavior/Discharge Planning: Goal: Ability to safely manage health-related needs after discharge will improve Outcome: Progressing   Problem: Health Behavior/Discharge Planning: Goal: Ability to manage health-related needs will improve Outcome: Progressing

## 2022-09-11 ENCOUNTER — Encounter: Payer: Medicare Other | Attending: Cardiovascular Disease | Admitting: *Deleted

## 2022-09-11 DIAGNOSIS — Z48812 Encounter for surgical aftercare following surgery on the circulatory system: Secondary | ICD-10-CM | POA: Insufficient documentation

## 2022-09-11 DIAGNOSIS — Z955 Presence of coronary angioplasty implant and graft: Secondary | ICD-10-CM | POA: Insufficient documentation

## 2022-09-11 NOTE — Progress Notes (Signed)
Initial phone call completed. Diagnosis can be found in [facility] 7/25. EP Orientation scheduled for Monday 8/19 at 2:30.

## 2022-09-15 VITALS — Ht 68.8 in | Wt 179.0 lb

## 2022-09-15 DIAGNOSIS — Z955 Presence of coronary angioplasty implant and graft: Secondary | ICD-10-CM

## 2022-09-15 NOTE — Progress Notes (Signed)
Cardiac Individual Treatment Plan  Patient Details  Name: Willie Griffin MRN: [id number] Date of Birth: May 05, 1955 Referring Provider:   Flowsheet Row Cardiac Rehab from 09/15/2022 in [facility] Cardiac and Pulmonary Rehab  Referring Provider Dr.Arida       Initial Encounter Date:  Flowsheet Row Cardiac Rehab from 09/15/2022 in [facility] Cardiac and Pulmonary Rehab  Date 09/15/22       Visit Diagnosis: Status post coronary artery stent placement  Patient's Home Medications on Admission:  Current Outpatient Medications:    aspirin EC 81 MG tablet, Take 1 tablet (81 mg total) by mouth daily. Swallow whole., Disp: 90 tablet, Rfl: 3   atorvastatin (LIPITOR) 20 MG tablet, Take 1 tablet (20 mg total) by mouth daily., Disp: 90 tablet, Rfl: 3   clopidogrel (PLAVIX) 75 MG tablet, Take 1 tablet (75 mg total) by mouth daily with breakfast., Disp: 30 tablet, Rfl: 11   Cyanocobalamin (VITAMIN B-12 PO), Take 2 Doses by mouth daily., Disp: , Rfl:    EPIPEN 2-PAK 0.3 MG/0.3ML SOAJ injection, INJECT 0.3 MG INTRAMUSCULARLY ONCE AS NEEDED FOR UP TO 1 DOSE, Disp: 2 each, Rfl: 1   hydrocortisone 2.5 % cream, Use rectally twice daily as neeed, Disp: 30 g, Rfl: 5   levothyroxine (SYNTHROID) 25 MCG tablet, TAKE 1 TABLET BY MOUTH EVERY DAY BEFORE BREAKFAST, Disp: 90 tablet, Rfl: 2   Magnesium 500 MG CAPS, Take by mouth. (Patient not taking: Reported on 07/11/2022), Disp: , Rfl:    OVER THE COUNTER MEDICATION, Take 2 capsules by mouth daily. Synoviox includes MSM, turmeric, and hyaluronic acid, Disp: , Rfl:    pantoprazole (PROTONIX) 40 MG tablet, Take 1 tablet (40 mg total) by mouth daily., Disp: 30 tablet, Rfl: 5   SOOLANTRA 1 % CREA, Apply topically daily. to face, Disp: , Rfl: 5   tadalafil (CIALIS) 20 MG tablet, Take 0.5-1 tablets (10-20 mg total) by mouth every other day as needed for erectile dysfunction., Disp: 10 tablet, Rfl: 11  Past Medical History: Past Medical History:  Diagnosis Date   Allergy     allergic rhinitis   GERD (gastroesophageal reflux disease)    Hemorrhoid    Hypothyroidism    Peyronie disease     Tobacco Use: Social History   Tobacco Use  Smoking Status Never   Passive exposure: Past  Smokeless Tobacco Never    Labs: Review Flowsheet  More data exists      Latest Ref Rng & Units 11/23/2019 09/12/2020 11/23/2020 04/08/2021 12/03/2021  Labs for ITP Cardiac and Pulmonary Rehab  Cholestrol 0 - 200 mg/dL 409  811  914  782  956   LDL (calc) 0 - 99 mg/dL 213  086  87  94  99   HDL-C >39.00 mg/dL 57.84  37  69.62  38  95.28   Trlycerides 0.0 - 149.0 mg/dL 41.3  96  24.4  95  01.0     Details             Exercise Target Goals: Exercise Program Goal: Individual exercise prescription set using results from initial 6 min walk test and THRR while considering  patient's activity barriers and safety.   Exercise Prescription Goal: Initial exercise prescription builds to 30-45 minutes a day of aerobic activity, 2-3 days per week.  Home exercise guidelines will be given to patient during program as part of exercise prescription that the participant will acknowledge.   Education: Aerobic Exercise: - Group verbal and visual presentation on the components of  exercise prescription. Introduces F.I.T.T principle from ACSM for exercise prescriptions.  Reviews F.I.T.T. principles of aerobic exercise including progression. Written material given at graduation.   Education: Resistance Exercise: - Group verbal and visual presentation on the components of exercise prescription. Introduces F.I.T.T principle from ACSM for exercise prescriptions  Reviews F.I.T.T. principles of resistance exercise including progression. Written material given at graduation.    Education: Exercise & Equipment Safety: - Individual verbal instruction and demonstration of equipment use and safety with use of the equipment. Flowsheet Row Cardiac Rehab from 09/15/2022 in [facility] Cardiac and Pulmonary  Rehab  Date 09/15/22  Educator MB  Instruction Review Code 1- Verbalizes Understanding       Education: Exercise Physiology & General Exercise Guidelines: - Group verbal and written instruction with models to review the exercise physiology of the cardiovascular system and associated critical values. Provides general exercise guidelines with specific guidelines to those with heart or lung disease.  Flowsheet Row Cardiac Rehab from 09/15/2022 in [facility] Cardiac and Pulmonary Rehab  Education need identified 09/15/22       Education: Flexibility, Balance, Mind/Body Relaxation: - Group verbal and visual presentation with interactive activity on the components of exercise prescription. Introduces F.I.T.T principle from ACSM for exercise prescriptions. Reviews F.I.T.T. principles of flexibility and balance exercise training including progression. Also discusses the mind body connection.  Reviews various relaxation techniques to help reduce and manage stress (i.e. Deep breathing, progressive muscle relaxation, and visualization). Balance handout provided to take home. Written material given at graduation.   Activity Barriers & Risk Stratification:  Activity Barriers & Cardiac Risk Stratification - 09/15/22 1713       Activity Barriers & Cardiac Risk Stratification   Activity Barriers Back Problems;Joint Problems    Cardiac Risk Stratification Moderate             6 Minute Walk:  6 Minute Walk     Row Name 09/15/22 1711         6 Minute Walk   Phase Initial     Distance 1440 feet     Walk Time 6 minutes     MPH 2.72     METS 3.59     RPE 12     Perceived Dyspnea  0     VO2 Peak 12.56     Symptoms Yes (comment)     Comments Knee pain and Left hip pain     Resting HR 60 bpm     Resting BP 120/60     Resting Oxygen Saturation  97 %     Exercise Oxygen Saturation  during 6 min walk 67 %     Max Ex. HR 102 bpm     Max Ex. BP 140/72     2 Minute Post BP 126/66               Oxygen Initial Assessment:   Oxygen Re-Evaluation:   Oxygen Discharge (Final Oxygen Re-Evaluation):   Initial Exercise Prescription:  Initial Exercise Prescription - 09/15/22 1700       Date of Initial Exercise RX and Referring Provider   Date 09/15/22    Referring Provider Dr.Arida      Oxygen   Maintain Oxygen Saturation 88% or higher      Treadmill   MPH 2.5    Grade 1    Minutes 15      Recumbant Bike   Level 2    RPM 50    Minutes 15  NuStep   Level 2    SPM 80    Minutes 15      REL-XR   Level 2    Minutes 15      Prescription Details   Frequency (times per week) 3    Duration Progress to 30 minutes of continuous aerobic without signs/symptoms of physical distress      Intensity   THRR 40-80% of Max Heartrate 62-124    Ratings of Perceived Exertion 11-13    Perceived Dyspnea 0-4      Progression   Progression Continue to progress workloads to maintain intensity without signs/symptoms of physical distress.      Resistance Training   Training Prescription Yes    Weight 4lb    Reps 10-15             Perform Capillary Blood Glucose checks as needed.  Exercise Prescription Changes:   Exercise Prescription Changes     Row Name 09/15/22 1700             Response to Exercise   Blood Pressure (Admit) 120/60       Blood Pressure (Exercise) 140/72       Blood Pressure (Exit) 126/66       Heart Rate (Admit) 60 bpm       Heart Rate (Exercise) 102 bpm       Heart Rate (Exit) 68 bpm       Oxygen Saturation (Admit) 97 %       Oxygen Saturation (Exercise) 97 %       Oxygen Saturation (Exit) 97 %       Rating of Perceived Exertion (Exercise) 12       Perceived Dyspnea (Exercise) 0       Symptoms knee pain and left hip pain       Comments results       Duration Progress to 30 minutes of  aerobic without signs/symptoms of physical distress                Exercise Comments:   Exercise Goals and Review:   Exercise  Goals     Row Name 09/15/22 1719             Exercise Goals   Increase Physical Activity Yes       Intervention Provide advice, education, support and counseling about physical activity/exercise needs.;Develop an individualized exercise prescription for aerobic and resistive training based on initial evaluation findings, risk stratification, comorbidities and participant's personal goals.       Expected Outcomes Short Term: Attend rehab on a regular basis to increase amount of physical activity.;Long Term: Exercising regularly at least 3-5 days a week.;Long Term: Add in home exercise to make exercise part of routine and to increase amount of physical activity.       Increase Strength and Stamina Yes       Intervention Provide advice, education, support and counseling about physical activity/exercise needs.;Develop an individualized exercise prescription for aerobic and resistive training based on initial evaluation findings, risk stratification, comorbidities and participant's personal goals.       Expected Outcomes Short Term: Increase workloads from initial exercise prescription for resistance, speed, and METs.;Short Term: Perform resistance training exercises routinely during rehab and add in resistance training at home;Long Term: Improve cardiorespiratory fitness, muscular endurance and strength as measured by increased METs and functional capacity ( )       Able to understand and use rate of perceived exertion (RPE) scale Yes  Intervention Provide education and explanation on how to use RPE scale       Expected Outcomes Short Term: Able to use RPE daily in rehab to express subjective intensity level;Long Term:  Able to use RPE to guide intensity level when exercising independently       Able to understand and use Dyspnea scale Yes       Intervention Provide education and explanation on how to use Dyspnea scale       Expected Outcomes Short Term: Able to use Dyspnea scale daily in  rehab to express subjective sense of shortness of breath during exertion;Long Term: Able to use Dyspnea scale to guide intensity level when exercising independently       Knowledge and understanding of Target Heart Rate Range (THRR) Yes       Intervention Provide education and explanation of THRR including how the numbers were predicted and where they are located for reference       Expected Outcomes Short Term: Able to state/look up THRR;Long Term: Able to use THRR to govern intensity when exercising independently;Short Term: Able to use daily as guideline for intensity in rehab       Able to check pulse independently Yes       Intervention Provide education and demonstration on how to check pulse in carotid and radial arteries.;Review the importance of being able to check your own pulse for safety during independent exercise       Expected Outcomes Short Term: Able to explain why pulse checking is important during independent exercise;Long Term: Able to check pulse independently and accurately       Understanding of Exercise Prescription Yes       Intervention Provide education, explanation, and written materials on patient's individual exercise prescription       Expected Outcomes Short Term: Able to explain program exercise prescription;Long Term: Able to explain home exercise prescription to exercise independently                Exercise Goals Re-Evaluation :   Discharge Exercise Prescription (Final Exercise Prescription Changes):  Exercise Prescription Changes - 09/15/22 1700       Response to Exercise   Blood Pressure (Admit) 120/60    Blood Pressure (Exercise) 140/72    Blood Pressure (Exit) 126/66    Heart Rate (Admit) 60 bpm    Heart Rate (Exercise) 102 bpm    Heart Rate (Exit) 68 bpm    Oxygen Saturation (Admit) 97 %    Oxygen Saturation (Exercise) 97 %    Oxygen Saturation (Exit) 97 %    Rating of Perceived Exertion (Exercise) 12    Perceived Dyspnea (Exercise) 0     Symptoms knee pain and left hip pain    Comments results    Duration Progress to 30 minutes of  aerobic without signs/symptoms of physical distress             Nutrition:  Target Goals: Understanding of nutrition guidelines, daily intake of sodium 1500mg , cholesterol 200mg , calories 30% from fat and 7% or less from saturated fats, daily to have 5 or more servings of fruits and vegetables.  Education: All About Nutrition: -Group instruction provided by verbal, written material, interactive activities, discussions, models, and posters to present general guidelines for heart healthy nutrition including fat, fiber, MyPlate, the role of sodium in heart healthy nutrition, utilization of the nutrition label, and utilization of this knowledge for meal planning. Follow up email sent as well. Written material given  at graduation. Flowsheet Row Cardiac Rehab from 09/15/2022 in [facility] Cardiac and Pulmonary Rehab  Education need identified 09/15/22       Biometrics:  Pre Biometrics - 09/15/22 1719       Pre Biometrics   Height 5' 8.8" (1.748 m)    Weight 179 lb (81.2 kg)    Waist Circumference 38 inches    Hip Circumference 40.5 inches    Waist to Hip Ratio 0.94 %    BMI (Calculated) 26.57    Single Leg Stand 30 seconds              Nutrition Therapy Plan and Nutrition Goals:  Nutrition Therapy & Goals - 09/15/22 1728       Personal Nutrition Goals   Nutrition Goal Meet with RD      Intervention Plan   Intervention Prescribe, educate and counsel regarding individualized specific dietary modifications aiming towards targeted core components such as weight, hypertension, lipid management, diabetes, heart failure and other comorbidities.;Nutrition handout(s) given to patient.    Expected Outcomes Short Term Goal: Understand basic principles of dietary content, such as calories, fat, sodium, cholesterol and nutrients.;Short Term Goal: A plan has been developed with personal  nutrition goals set during dietitian appointment.;Long Term Goal: Adherence to prescribed nutrition plan.             Nutrition Assessments:  Nutrition Assessments - 09/15/22 1723       Rate Your Plate Scores   Pre Score --            MEDIFICTS Score Key: ?70 Need to make dietary changes  40-70 Heart Healthy Diet ? 40 Therapeutic Level Cholesterol Diet  Flowsheet Row Cardiac Rehab from 09/15/2022 in [facility] Cardiac and Pulmonary Rehab  Picture Your Plate Total Score on Admission 54      Picture Your Plate Scores: <21 Unhealthy dietary pattern with much room for improvement. 41-50 Dietary pattern unlikely to meet recommendations for good health and room for improvement. 51-60 More healthful dietary pattern, with some room for improvement.  >60 Healthy dietary pattern, although there may be some specific behaviors that could be improved.    Nutrition Goals Re-Evaluation:   Nutrition Goals Discharge (Final Nutrition Goals Re-Evaluation):   Psychosocial: Target Goals: Acknowledge presence or absence of significant depression and/or stress, maximize coping skills, provide positive support system. Participant is able to verbalize types and ability to use techniques and skills needed for reducing stress and depression.   Education: Stress, Anxiety, and Depression - Group verbal and visual presentation to define topics covered.  Reviews how body is impacted by stress, anxiety, and depression.  Also discusses healthy ways to reduce stress and to treat/manage anxiety and depression.  Written material given at graduation.   Education: Sleep Hygiene -Provides group verbal and written instruction about how sleep can affect your health.  Define sleep hygiene, discuss sleep cycles and impact of sleep habits. Review good sleep hygiene tips.    Initial Review & Psychosocial Screening:  Initial Psych Review & Screening - 09/11/22 1344       Family Dynamics   Good Support System?  Yes      Barriers   Psychosocial barriers to participate in program There are no identifiable barriers or psychosocial needs.;The patient should benefit from training in stress management and relaxation.      Screening Interventions   Interventions Encouraged to exercise;Provide feedback about the scores to participant;To provide support and resources with identified psychosocial needs    Expected Outcomes  Long Term Goal: Stressors or current issues are controlled or eliminated.;Short Term goal: Utilizing psychosocial counselor, staff and physician to assist with identification of specific Stressors or current issues interfering with healing process. Setting desired goal for each stressor or current issue identified.;Long Term goal: The participant improves quality of Life and PHQ9 Scores as seen by post scores and/or verbalization of changes;Short Term goal: Identification and review with participant of any Quality of Life or Depression concerns found by scoring the questionnaire.             Quality of Life Scores:   Quality of Life - 09/15/22 1721       Quality of Life   Select Quality of Life      Quality of Life Scores   Health/Function Pre 24.2 %    Socioeconomic Pre 26.29 %    Psych/Spiritual Pre 24.86 %    Family Pre 27.6 %    GLOBAL Pre 25.39 %            Scores of 19 and below usually indicate a poorer quality of life in these areas.  A difference of  2-3 points is a clinically meaningful difference.  A difference of 2-3 points in the total score of the Quality of Life Index has been associated with significant improvement in overall quality of life, self-image, physical symptoms, and general health in studies assessing change in quality of life.  PHQ-9: Review Flowsheet  More data exists      09/15/2022 12/03/2021 11/23/2020 11/23/2019 10/27/2018  Depression screen PHQ 2/9  Decreased Interest 0 0 0 0 0  Down, Depressed, Hopeless 0 0 0 0 0  PHQ - 2 Score 0 0 0 0 0   Altered sleeping 0 - - - -  Tired, decreased energy 0 - - - -  Change in appetite 0 - - - -  Feeling bad or failure about yourself  0 - - - -  Trouble concentrating 0 - - - -  Moving slowly or fidgety/restless 0 - - - -  Suicidal thoughts 0 - - - -  PHQ-9 Score 0 - - - -    Details           Interpretation of Total Score  Total Score Depression Severity:  1-4 = Minimal depression, 5-9 = Mild depression, 10-14 = Moderate depression, 15-19 = Moderately severe depression, 20-27 = Severe depression   Psychosocial Evaluation and Intervention:  Psychosocial Evaluation - 09/11/22 1359       Psychosocial Evaluation & Interventions   Interventions Encouraged to exercise with the program and follow exercise prescription    Comments Jakie is coming to cardiac rehab post stents. He notes that he can tell a positive difference in his stamina, but wants to improve it more. He has had an episode of chest pain which was relieved with rest. He reports having a good support system and has no stress concerns at this time. He wants to come work on his strength and gain more knowledge about living a heart healthy life.    Expected Outcomes Short: attend cardiac rehab for education and exercise. Long: develop and maintain positive self care habits.    Continue Psychosocial Services  Follow up required by staff             Psychosocial Re-Evaluation:   Psychosocial Discharge (Final Psychosocial Re-Evaluation):   Vocational Rehabilitation: Provide vocational rehab assistance to qualifying candidates.   Vocational Rehab Evaluation & Intervention:  Vocational Rehab -  09/11/22 1344       Initial Vocational Rehab Evaluation & Intervention   Assessment shows need for Vocational Rehabilitation No             Education: Education Goals: Education classes will be provided on a variety of topics geared toward better understanding of heart health and risk factor modification. Participant  will state understanding/return demonstration of topics presented as noted by education test scores.  Learning Barriers/Preferences:  Learning Barriers/Preferences - 09/11/22 1343       Learning Barriers/Preferences   Learning Barriers None    Learning Preferences Individual Instruction             General Cardiac Education Topics:  AED/CPR: - Group verbal and written instruction with the use of models to demonstrate the basic use of the AED with the basic ABC's of resuscitation.   Anatomy and Cardiac Procedures: - Group verbal and visual presentation and models provide information about basic cardiac anatomy and function. Reviews the testing methods done to diagnose heart disease and the outcomes of the test results. Describes the treatment choices: Medical Management, Angioplasty, or Coronary Bypass Surgery for treating various heart conditions including Myocardial Infarction, Angina, Valve Disease, and Cardiac Arrhythmias.  Written material given at graduation. Flowsheet Row Cardiac Rehab from 09/15/2022 in [facility] Cardiac and Pulmonary Rehab  Education need identified 09/15/22       Medication Safety: - Group verbal and visual instruction to review commonly prescribed medications for heart and lung disease. Reviews the medication, class of the drug, and side effects. Includes the steps to properly store meds and maintain the prescription regimen.  Written material given at graduation.   Intimacy: - Group verbal instruction through game format to discuss how heart and lung disease can affect sexual intimacy. Written material given at graduation..   Know Your Numbers and Heart Failure: - Group verbal and visual instruction to discuss disease risk factors for cardiac and pulmonary disease and treatment options.  Reviews associated critical values for Overweight/Obesity, Hypertension, Cholesterol, and Diabetes.  Discusses basics of heart failure: signs/symptoms and treatments.   Introduces Heart Failure Zone chart for action plan for heart failure.  Written material given at graduation.   Infection Prevention: - Provides verbal and written material to individual with discussion of infection control including proper hand washing and proper equipment cleaning during exercise session. Flowsheet Row Cardiac Rehab from 09/15/2022 in [facility] Cardiac and Pulmonary Rehab  Date 09/15/22  Educator MB  Instruction Review Code 1- Verbalizes Understanding       Falls Prevention: - Provides verbal and written material to individual with discussion of falls prevention and safety. Flowsheet Row Cardiac Rehab from 09/15/2022 in [facility] Cardiac and Pulmonary Rehab  Date 09/15/22  Educator MB  Instruction Review Code 1- Verbalizes Understanding       Other: -Provides group and verbal instruction on various topics (see comments)   Knowledge Questionnaire Score:  Knowledge Questionnaire Score - 09/15/22 1724       Knowledge Questionnaire Score   Pre Score 23/26             Core Components/Risk Factors/Patient Goals at Admission:  Personal Goals and Risk Factors at Admission - 09/15/22 1725       Core Components/Risk Factors/Patient Goals on Admission    Weight Management Yes    Intervention Weight Management: Provide education and appropriate resources to help participant work on and attain dietary goals.;Weight Management: Develop a combined nutrition and exercise program designed to reach desired caloric  intake, while maintaining appropriate intake of nutrient and fiber, sodium and fats, and appropriate energy expenditure required for the weight goal.    Expected Outcomes Weight Maintenance: Understanding of the daily nutrition guidelines, which includes 25-35% calories from fat, 7% or less cal from saturated fats, less than 200mg  cholesterol, less than 1.5gm of sodium, & 5 or more servings of fruits and vegetables daily;Understanding recommendations for meals to include  15-35% energy as protein, 25-35% energy from fat, 35-60% energy from carbohydrates, less than 200mg  of dietary cholesterol, 20-35 gm of total fiber daily;Understanding of distribution of calorie intake throughout the day with the consumption of 4-5 meals/snacks;Short Term: Continue to assess and modify interventions until short term weight is achieved;Long Term: Adherence to nutrition and physical activity/exercise program aimed toward attainment of established weight goal    Lipids Yes    Intervention Provide education and support for participant on nutrition & aerobic/resistive exercise along with prescribed medications to achieve LDL 70mg , HDL >40mg .    Expected Outcomes Short Term: Participant states understanding of desired cholesterol values and is compliant with medications prescribed. Participant is following exercise prescription and nutrition guidelines.;Long Term: Cholesterol controlled with medications as prescribed, with individualized exercise RX and with personalized nutrition plan. Value goals: LDL < 70mg , HDL > 40 mg.             Education:Diabetes - Individual verbal and written instruction to review signs/symptoms of diabetes, desired ranges of glucose level fasting, after meals and with exercise. Acknowledge that pre and post exercise glucose checks will be done for 3 sessions at entry of program.   Core Components/Risk Factors/Patient Goals Review:    Core Components/Risk Factors/Patient Goals at Discharge (Final Review):    ITP Comments:  ITP Comments     Row Name 09/11/22 1341 09/15/22 1734         ITP Comments Initial phone call completed. Diagnosis can be found in [facility] 7/25. EP Orientation scheduled for Monday 8/19 at 2:30. Completed and gym orientation. Initial ITP created and sent for review to Dr. Bethann Punches, Medical Director.               Comments: Initial ITP

## 2022-09-15 NOTE — Patient Instructions (Signed)
Patient Instructions  Patient Details  Name: Willie Griffin MRN: [id number] Date of Birth: 03-29-1955 Referring Provider:  Iran Ouch, MD  Below are your personal goals for exercise, nutrition, and risk factors. Our goal is to help you stay on track towards obtaining and maintaining these goals. We will be discussing your progress on these goals with you throughout the program.  Initial Exercise Prescription:  Initial Exercise Prescription - 09/15/22 1700       Date of Initial Exercise RX and Referring Provider   Date 09/15/22    Referring Provider Dr.Arida      Oxygen   Maintain Oxygen Saturation 88% or higher      Treadmill   MPH 2.5    Grade 1    Minutes 15      Recumbant Bike   Level 2    RPM 50    Minutes 15      NuStep   Level 2    SPM 80    Minutes 15      REL-XR   Level 2    Minutes 15      Prescription Details   Frequency (times per week) 3    Duration Progress to 30 minutes of continuous aerobic without signs/symptoms of physical distress      Intensity   THRR 40-80% of Max Heartrate 62-124    Ratings of Perceived Exertion 11-13    Perceived Dyspnea 0-4      Progression   Progression Continue to progress workloads to maintain intensity without signs/symptoms of physical distress.      Resistance Training   Training Prescription Yes    Weight 4lb    Reps 10-15             Exercise Goals: Frequency: Be able to perform aerobic exercise two to three times per week in program working toward 2-5 days per week of home exercise.  Intensity: Work with a perceived exertion of 11 (fairly light) - 15 (hard) while following your exercise prescription.  We will make changes to your prescription with you as you progress through the program.   Duration: Be able to do 30 to 45 minutes of continuous aerobic exercise in addition to a 5 minute warm-up and a 5 minute cool-down routine.   Nutrition Goals: Your personal nutrition goals will be  established when you do your nutrition analysis with the dietician.  The following are general nutrition guidelines to follow: Cholesterol < 200mg /day Sodium < 1500mg /day Fiber: Men over 50 yrs - 30 grams per day  Personal Goals:  Personal Goals and Risk Factors at Admission - 09/15/22 1725       Core Components/Risk Factors/Patient Goals on Admission    Weight Management Yes    Intervention Weight Management: Provide education and appropriate resources to help participant work on and attain dietary goals.;Weight Management: Develop a combined nutrition and exercise program designed to reach desired caloric intake, while maintaining appropriate intake of nutrient and fiber, sodium and fats, and appropriate energy expenditure required for the weight goal.    Expected Outcomes Weight Maintenance: Understanding of the daily nutrition guidelines, which includes 25-35% calories from fat, 7% or less cal from saturated fats, less than 200mg  cholesterol, less than 1.5gm of sodium, & 5 or more servings of fruits and vegetables daily;Understanding recommendations for meals to include 15-35% energy as protein, 25-35% energy from fat, 35-60% energy from carbohydrates, less than 200mg  of dietary cholesterol, 20-35 gm of total fiber daily;Understanding of distribution of  calorie intake throughout the day with the consumption of 4-5 meals/snacks;Short Term: Continue to assess and modify interventions until short term weight is achieved;Long Term: Adherence to nutrition and physical activity/exercise program aimed toward attainment of established weight goal    Lipids Yes    Intervention Provide education and support for participant on nutrition & aerobic/resistive exercise along with prescribed medications to achieve LDL 70mg , HDL >40mg .    Expected Outcomes Short Term: Participant states understanding of desired cholesterol values and is compliant with medications prescribed. Participant is following exercise  prescription and nutrition guidelines.;Long Term: Cholesterol controlled with medications as prescribed, with individualized exercise RX and with personalized nutrition plan. Value goals: LDL < 70mg , HDL > 40 mg.             Tobacco Use Initial Evaluation: Social History   Tobacco Use  Smoking Status Never   Passive exposure: Past  Smokeless Tobacco Never    Exercise Goals and Review:  Exercise Goals     Row Name 09/15/22 1719             Exercise Goals   Increase Physical Activity Yes       Intervention Provide advice, education, support and counseling about physical activity/exercise needs.;Develop an individualized exercise prescription for aerobic and resistive training based on initial evaluation findings, risk stratification, comorbidities and participant's personal goals.       Expected Outcomes Short Term: Attend rehab on a regular basis to increase amount of physical activity.;Long Term: Exercising regularly at least 3-5 days a week.;Long Term: Add in home exercise to make exercise part of routine and to increase amount of physical activity.       Increase Strength and Stamina Yes       Intervention Provide advice, education, support and counseling about physical activity/exercise needs.;Develop an individualized exercise prescription for aerobic and resistive training based on initial evaluation findings, risk stratification, comorbidities and participant's personal goals.       Expected Outcomes Short Term: Increase workloads from initial exercise prescription for resistance, speed, and METs.;Short Term: Perform resistance training exercises routinely during rehab and add in resistance training at home;Long Term: Improve cardiorespiratory fitness, muscular endurance and strength as measured by increased METs and functional capacity ( )       Able to understand and use rate of perceived exertion (RPE) scale Yes       Intervention Provide education and explanation on how  to use RPE scale       Expected Outcomes Short Term: Able to use RPE daily in rehab to express subjective intensity level;Long Term:  Able to use RPE to guide intensity level when exercising independently       Able to understand and use Dyspnea scale Yes       Intervention Provide education and explanation on how to use Dyspnea scale       Expected Outcomes Short Term: Able to use Dyspnea scale daily in rehab to express subjective sense of shortness of breath during exertion;Long Term: Able to use Dyspnea scale to guide intensity level when exercising independently       Knowledge and understanding of Target Heart Rate Range (THRR) Yes       Intervention Provide education and explanation of THRR including how the numbers were predicted and where they are located for reference       Expected Outcomes Short Term: Able to state/look up THRR;Long Term: Able to use THRR to govern intensity when exercising independently;Short Term: Able to use  daily as guideline for intensity in rehab       Able to check pulse independently Yes       Intervention Provide education and demonstration on how to check pulse in carotid and radial arteries.;Review the importance of being able to check your own pulse for safety during independent exercise       Expected Outcomes Short Term: Able to explain why pulse checking is important during independent exercise;Long Term: Able to check pulse independently and accurately       Understanding of Exercise Prescription Yes       Intervention Provide education, explanation, and written materials on patient's individual exercise prescription       Expected Outcomes Short Term: Able to explain program exercise prescription;Long Term: Able to explain home exercise prescription to exercise independently                Copy of goals given to participant.

## 2022-09-17 ENCOUNTER — Ambulatory Visit: Payer: Medicare Other | Admitting: Cardiology

## 2022-09-17 ENCOUNTER — Other Ambulatory Visit: Payer: Self-pay | Admitting: Internal Medicine

## 2022-09-18 ENCOUNTER — Ambulatory Visit: Payer: Medicare Other

## 2022-09-18 DIAGNOSIS — I2510 Atherosclerotic heart disease of native coronary artery without angina pectoris: Secondary | ICD-10-CM | POA: Diagnosis not present

## 2022-09-18 LAB — ECHOCARDIOGRAM COMPLETE
Area-P 1/2: 4.21 cm2
S' Lateral: 3.2 cm

## 2022-09-22 ENCOUNTER — Encounter: Payer: Medicare Other | Admitting: *Deleted

## 2022-09-22 DIAGNOSIS — Z955 Presence of coronary angioplasty implant and graft: Secondary | ICD-10-CM

## 2022-09-22 DIAGNOSIS — Z48812 Encounter for surgical aftercare following surgery on the circulatory system: Secondary | ICD-10-CM | POA: Diagnosis not present

## 2022-09-22 NOTE — Progress Notes (Signed)
Daily Session Note  Patient Details  Name: Willie Griffin MRN: [id number] Date of Birth: [birth year] Referring Provider:   Flowsheet Row Cardiac Rehab from 09/15/2022 in [facility] Cardiac and Pulmonary Rehab  Referring Provider Dr.Arida       Encounter Date: 09/22/2022  Check In:  Session Check In - 09/22/22 1052       Check-In   Supervising physician immediately available to respond to emergencies See telemetry face sheet for immediately available ER MD    Location [facility]-Cardiac & Pulmonary Rehab    Staff Present Maxon Conetta BS, , Exercise Physiologist;Kelly Madilyn Fireman, BS, ACSM CEP, Exercise Physiologist;Juliahna Wiswell Katrinka Blazing, RN, ADN    Virtual Visit No    Medication changes reported     No    Fall or balance concerns reported    No    Warm-up and Cool-down Performed on first and last piece of equipment    Resistance Training Performed Yes    VAD Patient? No    PAD/SET Patient? No      Pain Assessment   Currently in Pain? No/denies                Social History   Tobacco Use  Smoking Status Never   Passive exposure: Past  Smokeless Tobacco Never    Goals Met:  Independence with exercise equipment Exercise tolerated well No report of concerns or symptoms today Strength training completed today  Goals Unmet:  Not Applicable  Comments: First full day of exercise!  Patient was oriented to gym and equipment including functions, settings, policies, and procedures.  Patient's individual exercise prescription and treatment plan were reviewed.  All starting workloads were established based on the results of the 6 minute walk test done at initial orientation visit.  The plan for exercise progression was also introduced and progression will be customized based on patient's performance and goals.     Dr. Bethann Punches is Medical Director for [facility] Cardiac Rehabilitation.  Dr. Vida Rigger is Medical Director for [facility] Pulmonary Rehabilitation.

## 2022-09-22 NOTE — Progress Notes (Signed)
Assessment start time: 10:47 AM  Accessibility to food: no concerns Digestive issues/concerns: no known food allergies, cucumber cause digestive problems  24-hours Recall: B: bagel, butter L: 2 slices of pizza, Dr pepper Snack: few pieces of taffy D: bowl of golden grams, 2% milk Snack: 4 nutter butter cookies  Beverages 2% milk ~1/2 gallon per week, 1 gallon sunny D per week, 1 soda per day, water (~32oz) Alcohol none Caffeine soda  Supplements B12 Intake Patterns Unpredictable eating habits, but knows he has poor habits.   Education r/t nutrition plan Patient drinking some water, ~32oz but is mostly drinking high calorie sugary beverages. Says he drinks them because he likes them. Spoke with him about the importance of water and reducing sugar intake. He is agreeable to setting a goal of more water, less sugary beverages. He reports he doesn't have a routine, and struggles to be consistent with his eating habits. Sometimes missing meal or snacking on junk. Discussed setting goal to be more consistent. He reports he will be going back to work soon, and will have more of a routine. Reviewed mediterranean diet handout, types of fats, sources, how to read label. Brainstormed and built out several meals and snacks with foods he likes and will eat, focusing on controlled portions of carbs, paired with healthier proteins and fats.    Goal 1: Drink more water, ~64oz, less sugary beverages Goal 2: Watch carb portions, pair carbs with protein at meals Goal 3: Work on building better habits with new routine   End time 11:31 AM

## 2022-09-22 NOTE — Progress Notes (Deleted)
Assessment start time: 10:47 AM  Accessibility to food: no concerns Digestive issues/concerns: no known food allergies, cucumber cause digestive problems  24-hours Recall: B: bagel, butter L: 2 slices of pizza, Dr pepper Snack: few pieces of taffy D: bowl of golden grams, 2% milk Snack: 4 nutter butter cookies  Beverages 2% milk ~1/2 gallon per week, 1 gallon sunny D per week, 1 soda per day, water (~32oz) Alcohol none Caffeine soda  Supplements B12 Intake Patterns Unpredictable eating habits, but knows he has poor habits.   Education r/t nutrition plan Patient drinking some water, ~32oz but is mostly drinking high calorie sugary beverages. Says he drinks them because he likes them. Spoke with him about the importance of water and reducing sugar intake. He is agreeable to setting a goal of more water, less sugary beverages. He reports he doesn't have a routine, and struggles to be consistent with his eating habits. Sometimes missing meal or snacking on junk. Discussed setting goal to be more consistent. He reports he will be going back to work soon, and will have more of a routine. Reviewed mediterranean diet handout, types of fats, sources, how to read label. Brainstormed and built out several meals and snacks with foods he likes and will eat, focusing on controlled portions of carbs, paired with healthier proteins and fats.    Goal 1: Drink more water, ~64oz, less sugary beverages Goal 2: Watch carb portions, pair carbs with protein at meals Goal 3: Work on building better habits with new routine   End time 11:31 AM

## 2022-09-24 ENCOUNTER — Encounter: Payer: Medicare Other | Admitting: *Deleted

## 2022-09-24 DIAGNOSIS — Z955 Presence of coronary angioplasty implant and graft: Secondary | ICD-10-CM

## 2022-09-24 NOTE — Progress Notes (Signed)
Daily Session Note  Patient Details  Name: HOLTEN MARZEC MRN: [id number] Date of Birth: [birth year] Referring Provider:   Flowsheet Row Cardiac Rehab from 09/15/2022 in [facility] Cardiac and Pulmonary Rehab  Referring Provider Dr.Arida       Encounter Date: 09/24/2022  Check In:  Session Check In - 09/24/22 1002       Check-In   Supervising physician immediately available to respond to emergencies See telemetry face sheet for immediately available ER MD    Location [facility]-Cardiac & Pulmonary Rehab    Staff Present Rory Percy, MS, Exercise Physiologist;Joseph Shelbie Proctor, RN, [place]    Virtual Visit No    Medication changes reported     No    Fall or balance concerns reported    No    Warm-up and Cool-down Performed on first and last piece of equipment    Resistance Training Performed Yes    VAD Patient? No    PAD/SET Patient? No      Pain Assessment   Currently in Pain? No/denies                Social History   Tobacco Use  Smoking Status Never   Passive exposure: Past  Smokeless Tobacco Never    Goals Met:  Independence with exercise equipment Exercise tolerated well No report of concerns or symptoms today Strength training completed today  Goals Unmet:  Not Applicable  Comments: Pt able to follow exercise prescription today without complaint.  Will continue to monitor for progression.    Dr. Bethann Punches is Medical Director for [facility] Cardiac Rehabilitation.  Dr. Vida Rigger is Medical Director for [facility] Pulmonary Rehabilitation.

## 2022-09-26 ENCOUNTER — Encounter: Payer: Medicare Other | Admitting: *Deleted

## 2022-09-26 DIAGNOSIS — Z955 Presence of coronary angioplasty implant and graft: Secondary | ICD-10-CM

## 2022-09-26 NOTE — Progress Notes (Signed)
Daily Session Note  Patient Details  Name: ERMA FRIERSON MRN: [id number] Date of Birth: 1955/02/23 Referring Provider:   Flowsheet Row Cardiac Rehab from 09/15/2022 in [facility] Cardiac and Pulmonary Rehab  Referring Provider Dr.Arida       Encounter Date: 09/26/2022  Check In:  Session Check In - 09/26/22 0956       Check-In   Supervising physician immediately available to respond to emergencies See telemetry face sheet for immediately available ER MD    Location [facility]-Cardiac & Pulmonary Rehab    Staff Present Cyndia Diver, RN, BSN, Rita Ohara, RN, ADN;Joseph Reino Kent, RCP,RRT,BSRT    Virtual Visit No    Medication changes reported     No    Fall or balance concerns reported    No    Tobacco Cessation No Change    Warm-up and Cool-down Performed on first and last piece of equipment    Resistance Training Performed Yes    VAD Patient? No    PAD/SET Patient? No      Pain Assessment   Currently in Pain? No/denies                Social History   Tobacco Use  Smoking Status Never   Passive exposure: Past  Smokeless Tobacco Never    Goals Met:  Independence with exercise equipment Exercise tolerated well No report of concerns or symptoms today Strength training completed today  Goals Unmet:  Not Applicable  Comments: Pt able to follow exercise prescription today without complaint.  Will continue to monitor for progression.    Dr. Bethann Punches is Medical Director for [facility] Cardiac Rehabilitation.  Dr. Vida Rigger is Medical Director for [facility] Pulmonary Rehabilitation.

## 2022-10-01 ENCOUNTER — Encounter: Payer: Medicare Other | Attending: Cardiovascular Disease | Admitting: *Deleted

## 2022-10-01 ENCOUNTER — Encounter: Payer: Self-pay | Admitting: *Deleted

## 2022-10-01 DIAGNOSIS — Z955 Presence of coronary angioplasty implant and graft: Secondary | ICD-10-CM | POA: Insufficient documentation

## 2022-10-01 DIAGNOSIS — Z48812 Encounter for surgical aftercare following surgery on the circulatory system: Secondary | ICD-10-CM | POA: Insufficient documentation

## 2022-10-01 DIAGNOSIS — I2089 Other forms of angina pectoris: Secondary | ICD-10-CM | POA: Diagnosis not present

## 2022-10-01 NOTE — Progress Notes (Signed)
Daily Session Note  Patient Details  Name: Willie Griffin MRN: [id number] Date of Birth: 1955-12-30 Referring Provider:   Flowsheet Row Cardiac Rehab from 09/15/2022 in [facility] Cardiac and Pulmonary Rehab  Referring Provider Dr.Arida       Encounter Date: 10/01/2022  Check In:  Session Check In - 10/01/22 1038       Check-In   Supervising physician immediately available to respond to emergencies See telemetry face sheet for immediately available ER MD    Location [facility]-Cardiac & Pulmonary Rehab    Staff Present Ronette Deter, BS, Exercise Physiologist;Meredith Jewel Baize, RN BSN;Maxon Conetta BS, , Exercise Physiologist;Lilleigh Hechavarria Katrinka Blazing, RN, ADN    Virtual Visit No    Medication changes reported     No    Fall or balance concerns reported    No    Warm-up and Cool-down Performed on first and last piece of equipment    Resistance Training Performed Yes    VAD Patient? No    PAD/SET Patient? No      Pain Assessment   Currently in Pain? No/denies                Social History   Tobacco Use  Smoking Status Never   Passive exposure: Past  Smokeless Tobacco Never    Goals Met:  Independence with exercise equipment Exercise tolerated well No report of concerns or symptoms today Strength training completed today  Goals Unmet:  Not Applicable  Comments: Pt able to follow exercise prescription today without complaint.  Will continue to monitor for progression.    Dr. Bethann Punches is Medical Director for [facility] Cardiac Rehabilitation.  Dr. Vida Rigger is Medical Director for [facility] Pulmonary Rehabilitation.

## 2022-10-01 NOTE — Progress Notes (Signed)
Cardiac Individual Treatment Plan  Patient Details  Name: Willie Griffin MRN: [id number] Date of Birth: Oct 24, 1955 Referring Provider:   Flowsheet Row Cardiac Rehab from 09/15/2022 in [facility] Cardiac and Pulmonary Rehab  Referring Provider Dr.Arida       Initial Encounter Date:  Flowsheet Row Cardiac Rehab from 09/15/2022 in [facility] Cardiac and Pulmonary Rehab  Date 09/15/22       Visit Diagnosis: Status post coronary artery stent placement  Patient's Home Medications on Admission:  Current Outpatient Medications:    aspirin EC 81 MG tablet, Take 1 tablet (81 mg total) by mouth daily. Swallow whole., Disp: 90 tablet, Rfl: 3   atorvastatin (LIPITOR) 20 MG tablet, Take 1 tablet (20 mg total) by mouth daily., Disp: 90 tablet, Rfl: 3   clopidogrel (PLAVIX) 75 MG tablet, Take 1 tablet (75 mg total) by mouth daily with breakfast., Disp: 30 tablet, Rfl: 11   Cyanocobalamin (VITAMIN B-12 PO), Take 2 Doses by mouth daily., Disp: , Rfl:    EPIPEN 2-PAK 0.3 MG/0.3ML SOAJ injection, INJECT 0.3 MG INTRAMUSCULARLY ONCE AS NEEDED FOR UP TO 1 DOSE, Disp: 2 each, Rfl: 1   hydrocortisone 2.5 % cream, Use rectally twice daily as neeed, Disp: 30 g, Rfl: 5   levothyroxine (SYNTHROID) 25 MCG tablet, TAKE 1 TABLET BY MOUTH EVERY DAY BEFORE BREAKFAST, Disp: 90 tablet, Rfl: 3   Magnesium 500 MG CAPS, Take by mouth. (Patient not taking: Reported on 07/11/2022), Disp: , Rfl:    OVER THE COUNTER MEDICATION, Take 2 capsules by mouth daily. Synoviox includes MSM, turmeric, and hyaluronic acid, Disp: , Rfl:    pantoprazole (PROTONIX) 40 MG tablet, Take 1 tablet (40 mg total) by mouth daily., Disp: 30 tablet, Rfl: 5   SOOLANTRA 1 % CREA, Apply topically daily. to face, Disp: , Rfl: 5   tadalafil (CIALIS) 20 MG tablet, Take 0.5-1 tablets (10-20 mg total) by mouth every other day as needed for erectile dysfunction., Disp: 10 tablet, Rfl: 11  Past Medical History: Past Medical History:  Diagnosis Date   Allergy     allergic rhinitis   GERD (gastroesophageal reflux disease)    Hemorrhoid    Hypothyroidism    Peyronie disease     Tobacco Use: Social History   Tobacco Use  Smoking Status Never   Passive exposure: Past  Smokeless Tobacco Never    Labs: Review Flowsheet  More data exists      Latest Ref Rng & Units 11/23/2019 09/12/2020 11/23/2020 04/08/2021 12/03/2021  Labs for ITP Cardiac and Pulmonary Rehab  Cholestrol 0 - 200 mg/dL 782  956  213  086  578   LDL (calc) 0 - 99 mg/dL 469  629  87  94  99   HDL-C >39.00 mg/dL 52.84  37  13.24  38  40.10   Trlycerides 0.0 - 149.0 mg/dL 27.2  96  53.6  95  64.4     Details             Exercise Target Goals: Exercise Program Goal: Individual exercise prescription set using results from initial 6 min walk test and THRR while considering  patient's activity barriers and safety.   Exercise Prescription Goal: Initial exercise prescription builds to 30-45 minutes a day of aerobic activity, 2-3 days per week.  Home exercise guidelines will be given to patient during program as part of exercise prescription that the participant will acknowledge.   Education: Aerobic Exercise: - Group verbal and visual presentation on the components of  exercise prescription. Introduces F.I.T.T principle from ACSM for exercise prescriptions.  Reviews F.I.T.T. principles of aerobic exercise including progression. Written material given at graduation.   Education: Resistance Exercise: - Group verbal and visual presentation on the components of exercise prescription. Introduces F.I.T.T principle from ACSM for exercise prescriptions  Reviews F.I.T.T. principles of resistance exercise including progression. Written material given at graduation.    Education: Exercise & Equipment Safety: - Individual verbal instruction and demonstration of equipment use and safety with use of the equipment. Flowsheet Row Cardiac Rehab from 09/15/2022 in [facility] Cardiac and Pulmonary  Rehab  Date 09/15/22  Educator MB  Instruction Review Code 1- Verbalizes Understanding       Education: Exercise Physiology & General Exercise Guidelines: - Group verbal and written instruction with models to review the exercise physiology of the cardiovascular system and associated critical values. Provides general exercise guidelines with specific guidelines to those with heart or lung disease.  Flowsheet Row Cardiac Rehab from 09/15/2022 in [facility] Cardiac and Pulmonary Rehab  Education need identified 09/15/22       Education: Flexibility, Balance, Mind/Body Relaxation: - Group verbal and visual presentation with interactive activity on the components of exercise prescription. Introduces F.I.T.T principle from ACSM for exercise prescriptions. Reviews F.I.T.T. principles of flexibility and balance exercise training including progression. Also discusses the mind body connection.  Reviews various relaxation techniques to help reduce and manage stress (i.e. Deep breathing, progressive muscle relaxation, and visualization). Balance handout provided to take home. Written material given at graduation.   Activity Barriers & Risk Stratification:  Activity Barriers & Cardiac Risk Stratification - 09/15/22 1713       Activity Barriers & Cardiac Risk Stratification   Activity Barriers Back Problems;Joint Problems    Cardiac Risk Stratification Moderate             6 Minute Walk:  6 Minute Walk     Row Name 09/15/22 1711         6 Minute Walk   Phase Initial     Distance 1440 feet     Walk Time 6 minutes     MPH 2.72     METS 3.59     RPE 12     Perceived Dyspnea  0     VO2 Peak 12.56     Symptoms Yes (comment)     Comments Knee pain and Left hip pain     Resting HR 60 bpm     Resting BP 120/60     Resting Oxygen Saturation  97 %     Exercise Oxygen Saturation  during 6 min walk 67 %     Max Ex. HR 102 bpm     Max Ex. BP 140/72     2 Minute Post BP 126/66               Oxygen Initial Assessment:   Oxygen Re-Evaluation:   Oxygen Discharge (Final Oxygen Re-Evaluation):   Initial Exercise Prescription:  Initial Exercise Prescription - 09/15/22 1700       Date of Initial Exercise RX and Referring Provider   Date 09/15/22    Referring Provider Dr.Arida      Oxygen   Maintain Oxygen Saturation 88% or higher      Treadmill   MPH 2.5    Grade 1    Minutes 15      Recumbant Bike   Level 2    RPM 50    Minutes 15  NuStep   Level 2    SPM 80    Minutes 15      REL-XR   Level 2    Minutes 15      Prescription Details   Frequency (times per week) 3    Duration Progress to 30 minutes of continuous aerobic without signs/symptoms of physical distress      Intensity   THRR 40-80% of Max Heartrate 62-124    Ratings of Perceived Exertion 11-13    Perceived Dyspnea 0-4      Progression   Progression Continue to progress workloads to maintain intensity without signs/symptoms of physical distress.      Resistance Training   Training Prescription Yes    Weight 4lb    Reps 10-15             Perform Capillary Blood Glucose checks as needed.  Exercise Prescription Changes:   Exercise Prescription Changes     Row Name 09/15/22 1700             Response to Exercise   Blood Pressure (Admit) 120/60       Blood Pressure (Exercise) 140/72       Blood Pressure (Exit) 126/66       Heart Rate (Admit) 60 bpm       Heart Rate (Exercise) 102 bpm       Heart Rate (Exit) 68 bpm       Oxygen Saturation (Admit) 97 %       Oxygen Saturation (Exercise) 97 %       Oxygen Saturation (Exit) 97 %       Rating of Perceived Exertion (Exercise) 12       Perceived Dyspnea (Exercise) 0       Symptoms knee pain and left hip pain       Comments results       Duration Progress to 30 minutes of  aerobic without signs/symptoms of physical distress                Exercise Comments:   Exercise Comments     Row Name 09/22/22  1054           Exercise Comments First full day of exercise!  Patient was oriented to gym and equipment including functions, settings, policies, and procedures.  Patient's individual exercise prescription and treatment plan were reviewed.  All starting workloads were established based on the results of the 6 minute walk test done at initial orientation visit.  The plan for exercise progression was also introduced and progression will be customized based on patient's performance and goals.                Exercise Goals and Review:   Exercise Goals     Row Name 09/15/22 1719             Exercise Goals   Increase Physical Activity Yes       Intervention Provide advice, education, support and counseling about physical activity/exercise needs.;Develop an individualized exercise prescription for aerobic and resistive training based on initial evaluation findings, risk stratification, comorbidities and participant's personal goals.       Expected Outcomes Short Term: Attend rehab on a regular basis to increase amount of physical activity.;Long Term: Exercising regularly at least 3-5 days a week.;Long Term: Add in home exercise to make exercise part of routine and to increase amount of physical activity.       Increase Strength and Stamina Yes  Intervention Provide advice, education, support and counseling about physical activity/exercise needs.;Develop an individualized exercise prescription for aerobic and resistive training based on initial evaluation findings, risk stratification, comorbidities and participant's personal goals.       Expected Outcomes Short Term: Increase workloads from initial exercise prescription for resistance, speed, and METs.;Short Term: Perform resistance training exercises routinely during rehab and add in resistance training at home;Long Term: Improve cardiorespiratory fitness, muscular endurance and strength as measured by increased METs and functional  capacity ( )       Able to understand and use rate of perceived exertion (RPE) scale Yes       Intervention Provide education and explanation on how to use RPE scale       Expected Outcomes Short Term: Able to use RPE daily in rehab to express subjective intensity level;Long Term:  Able to use RPE to guide intensity level when exercising independently       Able to understand and use Dyspnea scale Yes       Intervention Provide education and explanation on how to use Dyspnea scale       Expected Outcomes Short Term: Able to use Dyspnea scale daily in rehab to express subjective sense of shortness of breath during exertion;Long Term: Able to use Dyspnea scale to guide intensity level when exercising independently       Knowledge and understanding of Target Heart Rate Range (THRR) Yes       Intervention Provide education and explanation of THRR including how the numbers were predicted and where they are located for reference       Expected Outcomes Short Term: Able to state/look up THRR;Long Term: Able to use THRR to govern intensity when exercising independently;Short Term: Able to use daily as guideline for intensity in rehab       Able to check pulse independently Yes       Intervention Provide education and demonstration on how to check pulse in carotid and radial arteries.;Review the importance of being able to check your own pulse for safety during independent exercise       Expected Outcomes Short Term: Able to explain why pulse checking is important during independent exercise;Long Term: Able to check pulse independently and accurately       Understanding of Exercise Prescription Yes       Intervention Provide education, explanation, and written materials on patient's individual exercise prescription       Expected Outcomes Short Term: Able to explain program exercise prescription;Long Term: Able to explain home exercise prescription to exercise independently                Exercise  Goals Re-Evaluation :  Exercise Goals Re-Evaluation     Row Name 09/22/22 1054             Exercise Goal Re-Evaluation   Exercise Goals Review Able to understand and use rate of perceived exertion (RPE) scale;Able to understand and use Dyspnea scale;Knowledge and understanding of Target Heart Rate Range (THRR);Understanding of Exercise Prescription       Comments Reviewed RPE  and dyspnea scale, THR and program prescription with pt today.  Pt voiced understanding and was given a copy of goals to take home.       Expected Outcomes Short: Use RPE daily to regulate intensity. Long: Follow program prescription in THR.                Discharge Exercise Prescription (Final Exercise Prescription Changes):  Exercise Prescription Changes -  09/15/22 1700       Response to Exercise   Blood Pressure (Admit) 120/60    Blood Pressure (Exercise) 140/72    Blood Pressure (Exit) 126/66    Heart Rate (Admit) 60 bpm    Heart Rate (Exercise) 102 bpm    Heart Rate (Exit) 68 bpm    Oxygen Saturation (Admit) 97 %    Oxygen Saturation (Exercise) 97 %    Oxygen Saturation (Exit) 97 %    Rating of Perceived Exertion (Exercise) 12    Perceived Dyspnea (Exercise) 0    Symptoms knee pain and left hip pain    Comments results    Duration Progress to 30 minutes of  aerobic without signs/symptoms of physical distress             Nutrition:  Target Goals: Understanding of nutrition guidelines, daily intake of sodium 1500mg , cholesterol 200mg , calories 30% from fat and 7% or less from saturated fats, daily to have 5 or more servings of fruits and vegetables.  Education: All About Nutrition: -Group instruction provided by verbal, written material, interactive activities, discussions, models, and posters to present general guidelines for heart healthy nutrition including fat, fiber, MyPlate, the role of sodium in heart healthy nutrition, utilization of the nutrition label, and utilization of  this knowledge for meal planning. Follow up email sent as well. Written material given at graduation. Flowsheet Row Cardiac Rehab from 09/15/2022 in [facility] Cardiac and Pulmonary Rehab  Education need identified 09/15/22       Biometrics:  Pre Biometrics - 09/15/22 1719       Pre Biometrics   Height 5' 8.8" (1.748 m)    Weight 179 lb (81.2 kg)    Waist Circumference 38 inches    Hip Circumference 40.5 inches    Waist to Hip Ratio 0.94 %    BMI (Calculated) 26.57    Single Leg Stand 30 seconds              Nutrition Therapy Plan and Nutrition Goals:  Nutrition Therapy & Goals - 09/22/22 1154       Nutrition Therapy   Diet Cardiac, low sodum    Protein (specify units) 90    Fiber 30 grams    Whole Grain Foods 3 servings    Saturated Fats 15 max. grams    Fruits and Vegetables 5 servings/day    Sodium 2 grams      Personal Nutrition Goals   Nutrition Goal Drink more water, ~64oz, less sugary beverages    Personal Goal #2 Watch carb portions, pair carbs with protein at meals    Personal Goal #3 Work on building better habits with new routine    Comments Patient drinking some water, ~32oz but is mostly drinking high calorie sugary beverages. Says he drinks them because he likes them. Spoke with him about the importance of water and reducing sugar intake. He is agreeable to setting a goal of more water, less sugary beverages. He reports he doesn't have a routine, and struggles to be consistent with his eating habits. Sometimes missing meal or snacking on junk. Discussed setting goal to be more consistent. He reports he will be going back to work soon, and will have more of a routine. Reviewed mediterranean diet handout, types of fats, sources, how to read label. Brainstormed and built out several meals and snacks with foods he likes and will eat, focusing on controlled portions of carbs, paired with healthier proteins and fats.  Intervention Plan   Intervention Prescribe,  educate and counsel regarding individualized specific dietary modifications aiming towards targeted core components such as weight, hypertension, lipid management, diabetes, heart failure and other comorbidities.;Nutrition handout(s) given to patient.    Expected Outcomes Short Term Goal: Understand basic principles of dietary content, such as calories, fat, sodium, cholesterol and nutrients.;Short Term Goal: A plan has been developed with personal nutrition goals set during dietitian appointment.;Long Term Goal: Adherence to prescribed nutrition plan.             Nutrition Assessments:  Nutrition Assessments - 09/15/22 1723       Rate Your Plate Scores   Pre Score --            MEDIFICTS Score Key: ?70 Need to make dietary changes  40-70 Heart Healthy Diet ? 40 Therapeutic Level Cholesterol Diet  Flowsheet Row Cardiac Rehab from 09/15/2022 in [facility] Cardiac and Pulmonary Rehab  Picture Your Plate Total Score on Admission 54      Picture Your Plate Scores: <16 Unhealthy dietary pattern with much room for improvement. 41-50 Dietary pattern unlikely to meet recommendations for good health and room for improvement. 51-60 More healthful dietary pattern, with some room for improvement.  >60 Healthy dietary pattern, although there may be some specific behaviors that could be improved.    Nutrition Goals Re-Evaluation:   Nutrition Goals Discharge (Final Nutrition Goals Re-Evaluation):   Psychosocial: Target Goals: Acknowledge presence or absence of significant depression and/or stress, maximize coping skills, provide positive support system. Participant is able to verbalize types and ability to use techniques and skills needed for reducing stress and depression.   Education: Stress, Anxiety, and Depression - Group verbal and visual presentation to define topics covered.  Reviews how body is impacted by stress, anxiety, and depression.  Also discusses healthy ways to reduce  stress and to treat/manage anxiety and depression.  Written material given at graduation.   Education: Sleep Hygiene -Provides group verbal and written instruction about how sleep can affect your health.  Define sleep hygiene, discuss sleep cycles and impact of sleep habits. Review good sleep hygiene tips.    Initial Review & Psychosocial Screening:  Initial Psych Review & Screening - 09/11/22 1344       Family Dynamics   Good Support System? Yes      Barriers   Psychosocial barriers to participate in program There are no identifiable barriers or psychosocial needs.;The patient should benefit from training in stress management and relaxation.      Screening Interventions   Interventions Encouraged to exercise;Provide feedback about the scores to participant;To provide support and resources with identified psychosocial needs    Expected Outcomes Long Term Goal: Stressors or current issues are controlled or eliminated.;Short Term goal: Utilizing psychosocial counselor, staff and physician to assist with identification of specific Stressors or current issues interfering with healing process. Setting desired goal for each stressor or current issue identified.;Long Term goal: The participant improves quality of Life and PHQ9 Scores as seen by post scores and/or verbalization of changes;Short Term goal: Identification and review with participant of any Quality of Life or Depression concerns found by scoring the questionnaire.             Quality of Life Scores:   Quality of Life - 09/15/22 1721       Quality of Life   Select Quality of Life      Quality of Life Scores   Health/Function Pre 24.2 %  Socioeconomic Pre 26.29 %    Psych/Spiritual Pre 24.86 %    Family Pre 27.6 %    GLOBAL Pre 25.39 %            Scores of 19 and below usually indicate a poorer quality of life in these areas.  A difference of  2-3 points is a clinically meaningful difference.  A difference of 2-3  points in the total score of the Quality of Life Index has been associated with significant improvement in overall quality of life, self-image, physical symptoms, and general health in studies assessing change in quality of life.  PHQ-9: Review Flowsheet  More data exists      09/15/2022 12/03/2021 11/23/2020 11/23/2019 10/27/2018  Depression screen PHQ 2/9  Decreased Interest 0 0 0 0 0  Down, Depressed, Hopeless 0 0 0 0 0  PHQ - 2 Score 0 0 0 0 0  Altered sleeping 0 - - - -  Tired, decreased energy 0 - - - -  Change in appetite 0 - - - -  Feeling bad or failure about yourself  0 - - - -  Trouble concentrating 0 - - - -  Moving slowly or fidgety/restless 0 - - - -  Suicidal thoughts 0 - - - -  PHQ-9 Score 0 - - - -    Details           Interpretation of Total Score  Total Score Depression Severity:  1-4 = Minimal depression, 5-9 = Mild depression, 10-14 = Moderate depression, 15-19 = Moderately severe depression, 20-27 = Severe depression   Psychosocial Evaluation and Intervention:  Psychosocial Evaluation - 09/11/22 1359       Psychosocial Evaluation & Interventions   Interventions Encouraged to exercise with the program and follow exercise prescription    Comments Granvel is coming to cardiac rehab post stents. He notes that he can tell a positive difference in his stamina, but wants to improve it more. He has had an episode of chest pain which was relieved with rest. He reports having a good support system and has no stress concerns at this time. He wants to come work on his strength and gain more knowledge about living a heart healthy life.    Expected Outcomes Short: attend cardiac rehab for education and exercise. Long: develop and maintain positive self care habits.    Continue Psychosocial Services  Follow up required by staff             Psychosocial Re-Evaluation:   Psychosocial Discharge (Final Psychosocial Re-Evaluation):   Vocational Rehabilitation: Provide  vocational rehab assistance to qualifying candidates.   Vocational Rehab Evaluation & Intervention:  Vocational Rehab - 09/11/22 1344       Initial Vocational Rehab Evaluation & Intervention   Assessment shows need for Vocational Rehabilitation No             Education: Education Goals: Education classes will be provided on a variety of topics geared toward better understanding of heart health and risk factor modification. Participant will state understanding/return demonstration of topics presented as noted by education test scores.  Learning Barriers/Preferences:  Learning Barriers/Preferences - 09/11/22 1343       Learning Barriers/Preferences   Learning Barriers None    Learning Preferences Individual Instruction             General Cardiac Education Topics:  AED/CPR: - Group verbal and written instruction with the use of models to demonstrate the basic use of the AED  with the basic ABC's of resuscitation.   Anatomy and Cardiac Procedures: - Group verbal and visual presentation and models provide information about basic cardiac anatomy and function. Reviews the testing methods done to diagnose heart disease and the outcomes of the test results. Describes the treatment choices: Medical Management, Angioplasty, or Coronary Bypass Surgery for treating various heart conditions including Myocardial Infarction, Angina, Valve Disease, and Cardiac Arrhythmias.  Written material given at graduation. Flowsheet Row Cardiac Rehab from 09/15/2022 in [facility] Cardiac and Pulmonary Rehab  Education need identified 09/15/22       Medication Safety: - Group verbal and visual instruction to review commonly prescribed medications for heart and lung disease. Reviews the medication, class of the drug, and side effects. Includes the steps to properly store meds and maintain the prescription regimen.  Written material given at graduation.   Intimacy: - Group verbal instruction through game  format to discuss how heart and lung disease can affect sexual intimacy. Written material given at graduation..   Know Your Numbers and Heart Failure: - Group verbal and visual instruction to discuss disease risk factors for cardiac and pulmonary disease and treatment options.  Reviews associated critical values for Overweight/Obesity, Hypertension, Cholesterol, and Diabetes.  Discusses basics of heart failure: signs/symptoms and treatments.  Introduces Heart Failure Zone chart for action plan for heart failure.  Written material given at graduation.   Infection Prevention: - Provides verbal and written material to individual with discussion of infection control including proper hand washing and proper equipment cleaning during exercise session. Flowsheet Row Cardiac Rehab from 09/15/2022 in [facility] Cardiac and Pulmonary Rehab  Date 09/15/22  Educator MB  Instruction Review Code 1- Verbalizes Understanding       Falls Prevention: - Provides verbal and written material to individual with discussion of falls prevention and safety. Flowsheet Row Cardiac Rehab from 09/15/2022 in [facility] Cardiac and Pulmonary Rehab  Date 09/15/22  Educator MB  Instruction Review Code 1- Verbalizes Understanding       Other: -Provides group and verbal instruction on various topics (see comments)   Knowledge Questionnaire Score:  Knowledge Questionnaire Score - 09/15/22 1724       Knowledge Questionnaire Score   Pre Score 23/26             Core Components/Risk Factors/Patient Goals at Admission:  Personal Goals and Risk Factors at Admission - 09/15/22 1725       Core Components/Risk Factors/Patient Goals on Admission    Weight Management Yes    Intervention Weight Management: Provide education and appropriate resources to help participant work on and attain dietary goals.;Weight Management: Develop a combined nutrition and exercise program designed to reach desired caloric intake, while maintaining  appropriate intake of nutrient and fiber, sodium and fats, and appropriate energy expenditure required for the weight goal.    Expected Outcomes Weight Maintenance: Understanding of the daily nutrition guidelines, which includes 25-35% calories from fat, 7% or less cal from saturated fats, less than 200mg  cholesterol, less than 1.5gm of sodium, & 5 or more servings of fruits and vegetables daily;Understanding recommendations for meals to include 15-35% energy as protein, 25-35% energy from fat, 35-60% energy from carbohydrates, less than 200mg  of dietary cholesterol, 20-35 gm of total fiber daily;Understanding of distribution of calorie intake throughout the day with the consumption of 4-5 meals/snacks;Short Term: Continue to assess and modify interventions until short term weight is achieved;Long Term: Adherence to nutrition and physical activity/exercise program aimed toward attainment of established weight goal  Lipids Yes    Intervention Provide education and support for participant on nutrition & aerobic/resistive exercise along with prescribed medications to achieve LDL 70mg , HDL >40mg .    Expected Outcomes Short Term: Participant states understanding of desired cholesterol values and is compliant with medications prescribed. Participant is following exercise prescription and nutrition guidelines.;Long Term: Cholesterol controlled with medications as prescribed, with individualized exercise RX and with personalized nutrition plan. Value goals: LDL < 70mg , HDL > 40 mg.             Education:Diabetes - Individual verbal and written instruction to review signs/symptoms of diabetes, desired ranges of glucose level fasting, after meals and with exercise. Acknowledge that pre and post exercise glucose checks will be done for 3 sessions at entry of program.   Core Components/Risk Factors/Patient Goals Review:    Core Components/Risk Factors/Patient Goals at Discharge (Final Review):    ITP  Comments:  ITP Comments     Row Name 09/11/22 1341 09/15/22 1734 09/22/22 1054 10/01/22 1038     ITP Comments Initial phone call completed. Diagnosis can be found in [facility] 7/25. EP Orientation scheduled for Monday 8/19 at 2:30. Completed and gym orientation. Initial ITP created and sent for review to Dr. Bethann Punches, Medical Director. First full day of exercise!  Patient was oriented to gym and equipment including functions, settings, policies, and procedures.  Patient's individual exercise prescription and treatment plan were reviewed.  All starting workloads were established based on the results of the 6 minute walk test done at initial orientation visit.  The plan for exercise progression was also introduced and progression will be customized based on patient's performance and goals. 30 Day review completed. Medical Director ITP review done, changes made as directed, and signed approval by Medical Director.     new to program             Comments:

## 2022-10-06 ENCOUNTER — Encounter: Payer: Medicare Other | Admitting: *Deleted

## 2022-10-06 DIAGNOSIS — Z955 Presence of coronary angioplasty implant and graft: Secondary | ICD-10-CM

## 2022-10-06 NOTE — Progress Notes (Signed)
Daily Session Note  Patient Details  Name: Willie Griffin MRN: [id number] Date of Birth: [birth year] Referring Provider:   Flowsheet Row Cardiac Rehab from 09/15/2022 in [facility] Cardiac and Pulmonary Rehab  Referring Provider Dr.Arida       Encounter Date: 10/06/2022  Check In:  Session Check In - 10/06/22 1010       Check-In   Supervising physician immediately available to respond to emergencies See telemetry face sheet for immediately available ER MD    Location [facility]-Cardiac & Pulmonary Rehab    Staff Present Cora Collum, RN, BSN, Matthew Saras, BS, ACSM CEP, Exercise Physiologist;Maxon Conetta BS, , Exercise Physiologist    Virtual Visit No    Medication changes reported     No    Fall or balance concerns reported    No    Warm-up and Cool-down Performed on first and last piece of equipment    Resistance Training Performed Yes    VAD Patient? No    PAD/SET Patient? No      Pain Assessment   Currently in Pain? No/denies                Social History   Tobacco Use  Smoking Status Never   Passive exposure: Past  Smokeless Tobacco Never    Goals Met:  Independence with exercise equipment Exercise tolerated well No report of concerns or symptoms today  Goals Unmet:  Not Applicable  Comments: Pt able to follow exercise prescription today without complaint.  Will continue to monitor for progression.    Dr. Bethann Punches is Medical Director for [facility] Cardiac Rehabilitation.  Dr. Vida Rigger is Medical Director for [facility] Pulmonary Rehabilitation.

## 2022-10-07 ENCOUNTER — Ambulatory Visit (INDEPENDENT_AMBULATORY_CARE_PROVIDER_SITE_OTHER): Payer: Medicare Other | Admitting: Internal Medicine

## 2022-10-07 ENCOUNTER — Encounter: Payer: Self-pay | Admitting: Internal Medicine

## 2022-10-07 VITALS — BP 110/60 | HR 82 | Temp 98.1°F | Ht 67.0 in | Wt 180.0 lb

## 2022-10-07 DIAGNOSIS — T07XXXA Unspecified multiple injuries, initial encounter: Secondary | ICD-10-CM | POA: Diagnosis not present

## 2022-10-07 MED ORDER — TADALAFIL 20 MG PO TABS: 10.0000 mg | ORAL_TABLET | ORAL | 11 refills | Status: DC | PRN
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
## 2022-10-07 NOTE — Addendum Note (Signed)
Addended by: Eual Fines on: 10/07/2022 11:32 AM   Modules accepted: Orders
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
## 2022-10-08 ENCOUNTER — Encounter: Payer: Medicare Other | Admitting: *Deleted

## 2022-10-08 DIAGNOSIS — Z955 Presence of coronary angioplasty implant and graft: Secondary | ICD-10-CM | POA: Diagnosis not present

## 2022-10-08 NOTE — Progress Notes (Signed)
Daily Session Note  Patient Details  Name: COULTON KOH MRN: [id number] Date of Birth: 1955-09-11 Referring Provider:   Flowsheet Row Cardiac Rehab from 09/15/2022 in [facility] Cardiac and Pulmonary Rehab  Referring Provider Dr.Arida       Encounter Date: 10/08/2022  Check In:  Session Check In - 10/08/22 1109       Check-In   Supervising physician immediately available to respond to emergencies See telemetry face sheet for immediately available ER MD    Location [facility]-Cardiac & Pulmonary Rehab    Staff Present Rory Percy, MS, Exercise Physiologist;Joseph Shelbie Proctor, RN, [place]    Virtual Visit No    Medication changes reported     No    Fall or balance concerns reported    No    Warm-up and Cool-down Performed on first and last piece of equipment    Resistance Training Performed Yes    VAD Patient? No    PAD/SET Patient? No      Pain Assessment   Currently in Pain? No/denies                Social History   Tobacco Use  Smoking Status Never   Passive exposure: Past  Smokeless Tobacco Never    Goals Met:  Independence with exercise equipment Exercise tolerated well No report of concerns or symptoms today Strength training completed today  Goals Unmet:  Not Applicable  Comments: Pt able to follow exercise prescription today without complaint.  Will continue to monitor for progression.    Dr. Bethann Punches is Medical Director for [facility] Cardiac Rehabilitation.  Dr. Vida Rigger is Medical Director for [facility] Pulmonary Rehabilitation.

## 2022-10-09 ENCOUNTER — Encounter: Payer: Self-pay | Admitting: Cardiology

## 2022-10-09 ENCOUNTER — Ambulatory Visit: Payer: Medicare Other | Attending: Cardiology | Admitting: Cardiology

## 2022-10-09 VITALS — BP 138/62 | HR 78 | Ht 68.0 in | Wt 180.2 lb

## 2022-10-09 DIAGNOSIS — E785 Hyperlipidemia, unspecified: Secondary | ICD-10-CM | POA: Diagnosis present

## 2022-10-09 DIAGNOSIS — I2510 Atherosclerotic heart disease of native coronary artery without angina pectoris: Secondary | ICD-10-CM | POA: Diagnosis present
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
## 2022-10-09 NOTE — Patient Instructions (Signed)
Medication Instructions:   Your physician recommends that you continue on your current medications as directed. Please refer to the Current Medication list given to you today.  *If you need a refill on your cardiac medications before your next appointment, please call your pharmacy*   Lab Work:  Your provider would like for you to return in 4 months to have the following labs drawn: Lipid.   Please go to [facility] [address] ([facility] [address] [facility], [place] [address] You do not need an appointment.  They are open from 7:30 am-4 pm.  Lunch from 1:00 pm- 2:00 pm You WILL need to be fasting.   If you have labs (blood work) drawn today and your tests are completely normal, you will receive your results only by: MyChart Message (if you have MyChart) OR A paper copy in the mail If you have any lab test that is abnormal or we need to change your treatment, we will call you to review the results.   Testing/Procedures:  None Ordered    Follow-Up: At [facility], you and your health needs are our priority.  As part of our continuing mission to provide you with exceptional heart care, we have created designated Provider Care Teams.  These Care Teams include your primary Cardiologist (physician) and Advanced Practice Providers (APPs -  Physician Assistants and Nurse Practitioners) who all work together to provide you with the care you need, when you need it.  We recommend signing up for the patient portal called "MyChart".  Sign up information is provided on this After Visit Summary.  MyChart is used to connect with patients for Virtual Visits (Telemedicine).  Patients are able to view lab/test results, encounter notes, upcoming appointments, etc.  Non-urgent messages can be sent to your provider as well.   To learn more about what you can do with MyChart, go to [email or web address].    Your next appointment:   6 month(s)  Provider:   You may see  Debbe Odea, MD or one of the following Advanced Practice Providers on your designated Care Team:   Nicolasa Ducking, NP Eula Listen, PA-C Cadence Fransico Michael, PA-C Charlsie Quest, NP

## 2022-10-13 ENCOUNTER — Encounter: Payer: Medicare Other | Admitting: *Deleted

## 2022-10-13 DIAGNOSIS — Z955 Presence of coronary angioplasty implant and graft: Secondary | ICD-10-CM

## 2022-10-13 NOTE — Progress Notes (Signed)
Daily Session Note  Patient Details  Name: Willie Griffin MRN: [id number] Date of Birth: September 16, 1955 Referring Provider:   Flowsheet Row Cardiac Rehab from 09/15/2022 in [facility] Cardiac and Pulmonary Rehab  Referring Provider Dr.Arida       Encounter Date: 10/13/2022  Check In:  Session Check In - 10/13/22 0950       Check-In   Supervising physician immediately available to respond to emergencies See telemetry face sheet for immediately available ER MD    Location [facility]-Cardiac & Pulmonary Rehab    Staff Present Maxon Conetta BS, , Exercise Physiologist;Kelly Madilyn Fireman, BS, ACSM CEP, Exercise Physiologist;Ryne Mctigue Katrinka Blazing, RN, ADN    Virtual Visit No    Medication changes reported     No    Fall or balance concerns reported    No    Warm-up and Cool-down Performed on first and last piece of equipment    Resistance Training Performed Yes    VAD Patient? No    PAD/SET Patient? No      Pain Assessment   Currently in Pain? No/denies                Social History   Tobacco Use  Smoking Status Never   Passive exposure: Past  Smokeless Tobacco Never    Goals Met:  Independence with exercise equipment Exercise tolerated well No report of concerns or symptoms today Strength training completed today  Goals Unmet:  Not Applicable  Comments: Pt able to follow exercise prescription today without complaint.  Will continue to monitor for progression.    Dr. Bethann Punches is Medical Director for [facility] Cardiac Rehabilitation.  Dr. Vida Rigger is Medical Director for [facility] Pulmonary Rehabilitation.

## 2022-10-15 ENCOUNTER — Encounter: Payer: Medicare Other | Admitting: *Deleted

## 2022-10-15 DIAGNOSIS — Z955 Presence of coronary angioplasty implant and graft: Secondary | ICD-10-CM | POA: Diagnosis not present

## 2022-10-15 NOTE — Progress Notes (Signed)
Daily Session Note  Patient Details  Name: Willie Griffin MRN: [id number] Date of Birth: Oct 06, 1955 Referring Provider:   Flowsheet Row Cardiac Rehab from 09/15/2022 in [facility] Cardiac and Pulmonary Rehab  Referring Provider Dr.Arida       Encounter Date: 10/15/2022  Check In:  Session Check In - 10/15/22 1026       Check-In   Supervising physician immediately available to respond to emergencies See telemetry face sheet for immediately available ER MD    Location [facility]-Cardiac & Pulmonary Rehab    Staff Present Rory Percy, MS, Exercise Physiologist;Joseph Shelbie Proctor, RN, [place]    Virtual Visit No    Medication changes reported     No    Fall or balance concerns reported    No    Warm-up and Cool-down Performed on first and last piece of equipment    Resistance Training Performed Yes    VAD Patient? No    PAD/SET Patient? No      Pain Assessment   Currently in Pain? No/denies                Social History   Tobacco Use  Smoking Status Never   Passive exposure: Past  Smokeless Tobacco Never    Goals Met:  Independence with exercise equipment Exercise tolerated well No report of concerns or symptoms today Strength training completed today  Goals Unmet:  Not Applicable  Comments: Pt able to follow exercise prescription today without complaint.  Will continue to monitor for progression.    Dr. Bethann Punches is Medical Director for [facility] Cardiac Rehabilitation.  Dr. Vida Rigger is Medical Director for [facility] Pulmonary Rehabilitation.

## 2022-10-17 ENCOUNTER — Encounter: Payer: Medicare Other | Admitting: *Deleted

## 2022-10-17 DIAGNOSIS — Z955 Presence of coronary angioplasty implant and graft: Secondary | ICD-10-CM | POA: Diagnosis not present

## 2022-10-17 NOTE — Progress Notes (Signed)
Daily Session Note  Patient Details  Name: Willie Griffin MRN: [id number] Date of Birth: Jun 21, 1955 Referring Provider:   Flowsheet Row Cardiac Rehab from 09/15/2022 in [facility] Cardiac and Pulmonary Rehab  Referring Provider Dr.Arida       Encounter Date: 10/17/2022  Check In:  Session Check In - 10/17/22 1039       Check-In   Supervising physician immediately available to respond to emergencies See telemetry face sheet for immediately available ER MD    Location [facility]-Cardiac & Pulmonary Rehab    Staff Present Cora Collum, RN, BSN, CCRP;Noah Tickle, BS, Exercise Physiologist;Joseph [place], [place]    Virtual Visit No    Medication changes reported     No    Fall or balance concerns reported    No    Warm-up and Cool-down Performed on first and last piece of equipment    Resistance Training Performed Yes    VAD Patient? No    PAD/SET Patient? No      Pain Assessment   Currently in Pain? No/denies                Social History   Tobacco Use  Smoking Status Never   Passive exposure: Past  Smokeless Tobacco Never    Goals Met:  Independence with exercise equipment Exercise tolerated well No report of concerns or symptoms today  Goals Unmet:  Not Applicable  Comments: Pt able to follow exercise prescription today without complaint.  Will continue to monitor for progression.    Dr. Bethann Punches is Medical Director for [facility] Cardiac Rehabilitation.  Dr. Vida Rigger is Medical Director for [facility] Pulmonary Rehabilitation.

## 2022-10-20 ENCOUNTER — Encounter: Payer: Medicare Other | Admitting: *Deleted

## 2022-10-20 DIAGNOSIS — Z955 Presence of coronary angioplasty implant and graft: Secondary | ICD-10-CM

## 2022-10-20 NOTE — Progress Notes (Signed)
Daily Session Note  Patient Details  Name: Willie Griffin MRN: [id number] Date of Birth: [birth year] Referring Provider:   Flowsheet Row Cardiac Rehab from 09/15/2022 in [facility] Cardiac and Pulmonary Rehab  Referring Provider Dr.Arida       Encounter Date: 10/20/2022  Check In:  Session Check In - 10/20/22 0953       Check-In   Supervising physician immediately available to respond to emergencies See telemetry face sheet for immediately available ER MD    Location [facility]-Cardiac & Pulmonary Rehab    Staff Present Maxon Conetta BS, , Exercise Physiologist;Kelly Madilyn Fireman, BS, ACSM CEP, Exercise Physiologist;Suhaylah Wampole Katrinka Blazing, RN, ADN    Virtual Visit No    Medication changes reported     No    Fall or balance concerns reported    No    Warm-up and Cool-down Performed on first and last piece of equipment    Resistance Training Performed Yes    VAD Patient? No    PAD/SET Patient? No      Pain Assessment   Currently in Pain? No/denies                Social History   Tobacco Use  Smoking Status Never   Passive exposure: Past  Smokeless Tobacco Never    Goals Met:  Independence with exercise equipment Exercise tolerated well No report of concerns or symptoms today Strength training completed today  Goals Unmet:  Not Applicable  Comments: Pt able to follow exercise prescription today without complaint.  Will continue to monitor for progression.    Dr. Bethann Punches is Medical Director for [facility] Cardiac Rehabilitation.  Dr. Vida Rigger is Medical Director for [facility] Pulmonary Rehabilitation.

## 2022-10-22 ENCOUNTER — Encounter: Payer: Medicare Other | Admitting: *Deleted

## 2022-10-22 DIAGNOSIS — Z955 Presence of coronary angioplasty implant and graft: Secondary | ICD-10-CM | POA: Diagnosis not present

## 2022-10-22 NOTE — Progress Notes (Signed)
Daily Session Note  Patient Details  Name: Willie Griffin MRN: [id number] Date of Birth: 20-Jan-1956 Referring Provider:   Flowsheet Row Cardiac Rehab from 09/15/2022 in [facility] Cardiac and Pulmonary Rehab  Referring Provider Dr.Arida       Encounter Date: 10/22/2022  Check In:  Session Check In - 10/22/22 1002       Check-In   Supervising physician immediately available to respond to emergencies See telemetry face sheet for immediately available ER MD    Location [facility]-Cardiac & Pulmonary Rehab    Staff Present Lanny Hurst, RN, ADN;Laureen Manson Passey, BS, RRT, CPFT;Krista Karleen [place] RN, BSN    Virtual Visit No    Medication changes reported     No    Fall or balance concerns reported    No    Warm-up and Cool-down Performed on first and last piece of equipment    Resistance Training Performed Yes    VAD Patient? No    PAD/SET Patient? No      Pain Assessment   Currently in Pain? No/denies                Social History   Tobacco Use  Smoking Status Never   Passive exposure: Past  Smokeless Tobacco Never    Goals Met:  Independence with exercise equipment Exercise tolerated well No report of concerns or symptoms today Strength training completed today  Goals Unmet:  Not Applicable  Comments: Pt able to follow exercise prescription today without complaint.  Will continue to monitor for progression.    Dr. Bethann Punches is Medical Director for [facility] Cardiac Rehabilitation.  Dr. Vida Rigger is Medical Director for [facility] Pulmonary Rehabilitation.

## 2022-10-27 ENCOUNTER — Encounter: Payer: Medicare Other | Admitting: *Deleted

## 2022-10-27 DIAGNOSIS — Z955 Presence of coronary angioplasty implant and graft: Secondary | ICD-10-CM | POA: Diagnosis not present

## 2022-10-27 NOTE — Progress Notes (Signed)
Daily Session Note  Patient Details  Name: BRENTLEY LANDFAIR MRN: [id number] Date of Birth: 31-Aug-1955 Referring Provider:   Flowsheet Row Cardiac Rehab from 09/15/2022 in [facility] Cardiac and Pulmonary Rehab  Referring Provider Dr.Arida       Encounter Date: 10/27/2022  Check In:  Session Check In - 10/27/22 0951       Check-In   Supervising physician immediately available to respond to emergencies See telemetry face sheet for immediately available ER MD    Location [facility]-Cardiac & Pulmonary Rehab    Staff Present Maxon Conetta BS, , Exercise Physiologist;Kelly Madilyn Fireman, BS, ACSM CEP, Exercise Physiologist;Bret Stamour Katrinka Blazing, RN, ADN    Virtual Visit No    Medication changes reported     No    Fall or balance concerns reported    No    Warm-up and Cool-down Performed on first and last piece of equipment    Resistance Training Performed Yes    VAD Patient? No    PAD/SET Patient? No      Pain Assessment   Currently in Pain? No/denies                Social History   Tobacco Use  Smoking Status Never   Passive exposure: Past  Smokeless Tobacco Never    Goals Met:  Independence with exercise equipment Exercise tolerated well Personal goals reviewed No report of concerns or symptoms today Strength training completed today  Goals Unmet:  Not Applicable  Comments: Pt able to follow exercise prescription today without complaint.  Will continue to monitor for progression.    Dr. Bethann Punches is Medical Director for [facility] Cardiac Rehabilitation.  Dr. Vida Rigger is Medical Director for [facility] Pulmonary Rehabilitation.

## 2022-10-29 ENCOUNTER — Encounter: Payer: Self-pay | Admitting: *Deleted

## 2022-10-29 ENCOUNTER — Encounter: Payer: Medicare Other | Attending: Cardiovascular Disease | Admitting: *Deleted

## 2022-10-29 DIAGNOSIS — Z955 Presence of coronary angioplasty implant and graft: Secondary | ICD-10-CM | POA: Insufficient documentation

## 2022-10-29 DIAGNOSIS — Z48812 Encounter for surgical aftercare following surgery on the circulatory system: Secondary | ICD-10-CM | POA: Insufficient documentation

## 2022-10-29 NOTE — Progress Notes (Signed)
Cardiac Individual Treatment Plan  Patient Details  Name: Willie Griffin MRN: [id number] Date of Birth: 02-10-1955 Referring Provider:   Flowsheet Row Cardiac Rehab from 09/15/2022 in [facility] Cardiac and Pulmonary Rehab  Referring Provider Dr.Arida       Initial Encounter Date:  Flowsheet Row Cardiac Rehab from 09/15/2022 in [facility] Cardiac and Pulmonary Rehab  Date 09/15/22       Visit Diagnosis: Status post coronary artery stent placement  Patient's Home Medications on Admission:  Current Outpatient Medications:    aspirin EC 81 MG tablet, Take 1 tablet (81 mg total) by mouth daily. Swallow whole., Disp: 90 tablet, Rfl: 3   atorvastatin (LIPITOR) 20 MG tablet, Take 1 tablet (20 mg total) by mouth daily., Disp: 90 tablet, Rfl: 3   clopidogrel (PLAVIX) 75 MG tablet, Take 1 tablet (75 mg total) by mouth daily with breakfast., Disp: 30 tablet, Rfl: 11   Cyanocobalamin (VITAMIN B-12 PO), Take 2 Doses by mouth daily., Disp: , Rfl:    EPIPEN 2-PAK 0.3 MG/0.3ML SOAJ injection, INJECT 0.3 MG INTRAMUSCULARLY ONCE AS NEEDED FOR UP TO 1 DOSE, Disp: 2 each, Rfl: 1   hydrocortisone 2.5 % cream, Use rectally twice daily as neeed, Disp: 30 g, Rfl: 5   levothyroxine (SYNTHROID) 25 MCG tablet, TAKE 1 TABLET BY MOUTH EVERY DAY BEFORE BREAKFAST, Disp: 90 tablet, Rfl: 3   OVER THE COUNTER MEDICATION, Take 2 capsules by mouth daily. Synoviox includes MSM, turmeric, and hyaluronic acid, Disp: , Rfl:    pantoprazole (PROTONIX) 40 MG tablet, Take 1 tablet (40 mg total) by mouth daily., Disp: 30 tablet, Rfl: 5   SOOLANTRA 1 % CREA, Apply topically daily. to face, Disp: , Rfl: 5   tadalafil (CIALIS) 20 MG tablet, Take 0.5-1 tablets (10-20 mg total) by mouth every other day as needed for erectile dysfunction., Disp: 10 tablet, Rfl: 11  Past Medical History: Past Medical History:  Diagnosis Date   Allergy    allergic rhinitis   GERD (gastroesophageal reflux disease)    Hemorrhoid    Hypothyroidism     Peyronie disease     Tobacco Use: Social History   Tobacco Use  Smoking Status Never   Passive exposure: Past  Smokeless Tobacco Never    Labs: Review Flowsheet  More data exists      Latest Ref Rng & Units 11/23/2019 09/12/2020 11/23/2020 04/08/2021 12/03/2021  Labs for ITP Cardiac and Pulmonary Rehab  Cholestrol 0 - 200 mg/dL 440  347  425  956  387   LDL (calc) 0 - 99 mg/dL 564  332  87  94  99   HDL-C >39.00 mg/dL 95.18  37  84.16  38  60.63   Trlycerides 0.0 - 149.0 mg/dL 01.6  96  01.0  95  93.2     Details             Exercise Target Goals: Exercise Program Goal: Individual exercise prescription set using results from initial 6 min walk test and THRR while considering  patient's activity barriers and safety.   Exercise Prescription Goal: Initial exercise prescription builds to 30-45 minutes a day of aerobic activity, 2-3 days per week.  Home exercise guidelines will be given to patient during program as part of exercise prescription that the participant will acknowledge.   Education: Aerobic Exercise: - Group verbal and visual presentation on the components of exercise prescription. Introduces F.I.T.T principle from ACSM for exercise prescriptions.  Reviews F.I.T.T. principles of aerobic exercise including progression.  Written material given at graduation.   Education: Resistance Exercise: - Group verbal and visual presentation on the components of exercise prescription. Introduces F.I.T.T principle from ACSM for exercise prescriptions  Reviews F.I.T.T. principles of resistance exercise including progression. Written material given at graduation.    Education: Exercise & Equipment Safety: - Individual verbal instruction and demonstration of equipment use and safety with use of the equipment. Flowsheet Row Cardiac Rehab from 09/15/2022 in [facility] Cardiac and Pulmonary Rehab  Date 09/15/22  Educator MB  Instruction Review Code 1- Verbalizes Understanding        Education: Exercise Physiology & General Exercise Guidelines: - Group verbal and written instruction with models to review the exercise physiology of the cardiovascular system and associated critical values. Provides general exercise guidelines with specific guidelines to those with heart or lung disease.  Flowsheet Row Cardiac Rehab from 09/15/2022 in [facility] Cardiac and Pulmonary Rehab  Education need identified 09/15/22       Education: Flexibility, Balance, Mind/Body Relaxation: - Group verbal and visual presentation with interactive activity on the components of exercise prescription. Introduces F.I.T.T principle from ACSM for exercise prescriptions. Reviews F.I.T.T. principles of flexibility and balance exercise training including progression. Also discusses the mind body connection.  Reviews various relaxation techniques to help reduce and manage stress (i.e. Deep breathing, progressive muscle relaxation, and visualization). Balance handout provided to take home. Written material given at graduation.   Activity Barriers & Risk Stratification:  Activity Barriers & Cardiac Risk Stratification - 09/15/22 1713       Activity Barriers & Cardiac Risk Stratification   Activity Barriers Back Problems;Joint Problems    Cardiac Risk Stratification Moderate             6 Minute Walk:  6 Minute Walk     Row Name 09/15/22 1711         6 Minute Walk   Phase Initial     Distance 1440 feet     Walk Time 6 minutes     MPH 2.72     METS 3.59     RPE 12     Perceived Dyspnea  0     VO2 Peak 12.56     Symptoms Yes (comment)     Comments Knee pain and Left hip pain     Resting HR 60 bpm     Resting BP 120/60     Resting Oxygen Saturation  97 %     Exercise Oxygen Saturation  during 6 min walk 67 %     Max Ex. HR 102 bpm     Max Ex. BP 140/72     2 Minute Post BP 126/66              Oxygen Initial Assessment:   Oxygen Re-Evaluation:   Oxygen Discharge (Final Oxygen  Re-Evaluation):   Initial Exercise Prescription:  Initial Exercise Prescription - 09/15/22 1700       Date of Initial Exercise RX and Referring Provider   Date 09/15/22    Referring Provider Dr.Arida      Oxygen   Maintain Oxygen Saturation 88% or higher      Treadmill   MPH 2.5    Grade 1    Minutes 15      Recumbant Bike   Level 2    RPM 50    Minutes 15      NuStep   Level 2    SPM 80    Minutes 15  REL-XR   Level 2    Minutes 15      Prescription Details   Frequency (times per week) 3    Duration Progress to 30 minutes of continuous aerobic without signs/symptoms of physical distress      Intensity   THRR 40-80% of Max Heartrate 62-124    Ratings of Perceived Exertion 11-13    Perceived Dyspnea 0-4      Progression   Progression Continue to progress workloads to maintain intensity without signs/symptoms of physical distress.      Resistance Training   Training Prescription Yes    Weight 4lb    Reps 10-15             Perform Capillary Blood Glucose checks as needed.  Exercise Prescription Changes:   Exercise Prescription Changes     Row Name 09/15/22 1700             Response to Exercise   Blood Pressure (Admit) 120/60       Blood Pressure (Exercise) 140/72       Blood Pressure (Exit) 126/66       Heart Rate (Admit) 60 bpm       Heart Rate (Exercise) 102 bpm       Heart Rate (Exit) 68 bpm       Oxygen Saturation (Admit) 97 %       Oxygen Saturation (Exercise) 97 %       Oxygen Saturation (Exit) 97 %       Rating of Perceived Exertion (Exercise) 12       Perceived Dyspnea (Exercise) 0       Symptoms knee pain and left hip pain       Comments results       Duration Progress to 30 minutes of  aerobic without signs/symptoms of physical distress                Exercise Comments:   Exercise Comments     Row Name 09/22/22 1054           Exercise Comments First full day of exercise!  Patient was oriented to gym and  equipment including functions, settings, policies, and procedures.  Patient's individual exercise prescription and treatment plan were reviewed.  All starting workloads were established based on the results of the 6 minute walk test done at initial orientation visit.  The plan for exercise progression was also introduced and progression will be customized based on patient's performance and goals.                Exercise Goals and Review:   Exercise Goals     Row Name 09/15/22 1719             Exercise Goals   Increase Physical Activity Yes       Intervention Provide advice, education, support and counseling about physical activity/exercise needs.;Develop an individualized exercise prescription for aerobic and resistive training based on initial evaluation findings, risk stratification, comorbidities and participant's personal goals.       Expected Outcomes Short Term: Attend rehab on a regular basis to increase amount of physical activity.;Long Term: Exercising regularly at least 3-5 days a week.;Long Term: Add in home exercise to make exercise part of routine and to increase amount of physical activity.       Increase Strength and Stamina Yes       Intervention Provide advice, education, support and counseling about physical activity/exercise needs.;Develop an individualized exercise prescription for  aerobic and resistive training based on initial evaluation findings, risk stratification, comorbidities and participant's personal goals.       Expected Outcomes Short Term: Increase workloads from initial exercise prescription for resistance, speed, and METs.;Short Term: Perform resistance training exercises routinely during rehab and add in resistance training at home;Long Term: Improve cardiorespiratory fitness, muscular endurance and strength as measured by increased METs and functional capacity ( )       Able to understand and use rate of perceived exertion (RPE) scale Yes        Intervention Provide education and explanation on how to use RPE scale       Expected Outcomes Short Term: Able to use RPE daily in rehab to express subjective intensity level;Long Term:  Able to use RPE to guide intensity level when exercising independently       Able to understand and use Dyspnea scale Yes       Intervention Provide education and explanation on how to use Dyspnea scale       Expected Outcomes Short Term: Able to use Dyspnea scale daily in rehab to express subjective sense of shortness of breath during exertion;Long Term: Able to use Dyspnea scale to guide intensity level when exercising independently       Knowledge and understanding of Target Heart Rate Range (THRR) Yes       Intervention Provide education and explanation of THRR including how the numbers were predicted and where they are located for reference       Expected Outcomes Short Term: Able to state/look up THRR;Long Term: Able to use THRR to govern intensity when exercising independently;Short Term: Able to use daily as guideline for intensity in rehab       Able to check pulse independently Yes       Intervention Provide education and demonstration on how to check pulse in carotid and radial arteries.;Review the importance of being able to check your own pulse for safety during independent exercise       Expected Outcomes Short Term: Able to explain why pulse checking is important during independent exercise;Long Term: Able to check pulse independently and accurately       Understanding of Exercise Prescription Yes       Intervention Provide education, explanation, and written materials on patient's individual exercise prescription       Expected Outcomes Short Term: Able to explain program exercise prescription;Long Term: Able to explain home exercise prescription to exercise independently                Exercise Goals Re-Evaluation :  Exercise Goals Re-Evaluation     Row Name 09/22/22 1054              Exercise Goal Re-Evaluation   Exercise Goals Review Able to understand and use rate of perceived exertion (RPE) scale;Able to understand and use Dyspnea scale;Knowledge and understanding of Target Heart Rate Range (THRR);Understanding of Exercise Prescription       Comments Reviewed RPE  and dyspnea scale, THR and program prescription with pt today.  Pt voiced understanding and was given a copy of goals to take home.       Expected Outcomes Short: Use RPE daily to regulate intensity. Long: Follow program prescription in THR.                Discharge Exercise Prescription (Final Exercise Prescription Changes):  Exercise Prescription Changes - 09/15/22 1700       Response to Exercise   Blood Pressure (  Admit) 120/60    Blood Pressure (Exercise) 140/72    Blood Pressure (Exit) 126/66    Heart Rate (Admit) 60 bpm    Heart Rate (Exercise) 102 bpm    Heart Rate (Exit) 68 bpm    Oxygen Saturation (Admit) 97 %    Oxygen Saturation (Exercise) 97 %    Oxygen Saturation (Exit) 97 %    Rating of Perceived Exertion (Exercise) 12    Perceived Dyspnea (Exercise) 0    Symptoms knee pain and left hip pain    Comments results    Duration Progress to 30 minutes of  aerobic without signs/symptoms of physical distress             Nutrition:  Target Goals: Understanding of nutrition guidelines, daily intake of sodium 1500mg , cholesterol 200mg , calories 30% from fat and 7% or less from saturated fats, daily to have 5 or more servings of fruits and vegetables.  Education: All About Nutrition: -Group instruction provided by verbal, written material, interactive activities, discussions, models, and posters to present general guidelines for heart healthy nutrition including fat, fiber, MyPlate, the role of sodium in heart healthy nutrition, utilization of the nutrition label, and utilization of this knowledge for meal planning. Follow up email sent as well. Written material given at  graduation. Flowsheet Row Cardiac Rehab from 09/15/2022 in [facility] Cardiac and Pulmonary Rehab  Education need identified 09/15/22       Biometrics:  Pre Biometrics - 09/15/22 1719       Pre Biometrics   Height 5' 8.8" (1.748 m)    Weight 179 lb (81.2 kg)    Waist Circumference 38 inches    Hip Circumference 40.5 inches    Waist to Hip Ratio 0.94 %    BMI (Calculated) 26.57    Single Leg Stand 30 seconds              Nutrition Therapy Plan and Nutrition Goals:  Nutrition Therapy & Goals - 09/22/22 1154       Nutrition Therapy   Diet Cardiac, low sodum    Protein (specify units) 90    Fiber 30 grams    Whole Grain Foods 3 servings    Saturated Fats 15 max. grams    Fruits and Vegetables 5 servings/day    Sodium 2 grams      Personal Nutrition Goals   Nutrition Goal Drink more water, ~64oz, less sugary beverages    Personal Goal #2 Watch carb portions, pair carbs with protein at meals    Personal Goal #3 Work on building better habits with new routine    Comments Patient drinking some water, ~32oz but is mostly drinking high calorie sugary beverages. Says he drinks them because he likes them. Spoke with him about the importance of water and reducing sugar intake. He is agreeable to setting a goal of more water, less sugary beverages. He reports he doesn't have a routine, and struggles to be consistent with his eating habits. Sometimes missing meal or snacking on junk. Discussed setting goal to be more consistent. He reports he will be going back to work soon, and will have more of a routine. Reviewed mediterranean diet handout, types of fats, sources, how to read label. Brainstormed and built out several meals and snacks with foods he likes and will eat, focusing on controlled portions of carbs, paired with healthier proteins and fats.      Intervention Plan   Intervention Prescribe, educate and counsel regarding individualized specific dietary  modifications aiming towards  targeted core components such as weight, hypertension, lipid management, diabetes, heart failure and other comorbidities.;Nutrition handout(s) given to patient.    Expected Outcomes Short Term Goal: Understand basic principles of dietary content, such as calories, fat, sodium, cholesterol and nutrients.;Short Term Goal: A plan has been developed with personal nutrition goals set during dietitian appointment.;Long Term Goal: Adherence to prescribed nutrition plan.             Nutrition Assessments:  Nutrition Assessments - 09/15/22 1723       Rate Your Plate Scores   Pre Score --            MEDIFICTS Score Key: >=70 Need to make dietary changes  40-70 Heart Healthy Diet <= 40 Therapeutic Level Cholesterol Diet  Flowsheet Row Cardiac Rehab from 09/15/2022 in [facility] Cardiac and Pulmonary Rehab  Picture Your Plate Total Score on Admission 54      Picture Your Plate Scores: <04 Unhealthy dietary pattern with much room for improvement. 41-50 Dietary pattern unlikely to meet recommendations for good health and room for improvement. 51-60 More healthful dietary pattern, with some room for improvement.  >60 Healthy dietary pattern, although there may be some specific behaviors that could be improved.    Nutrition Goals Re-Evaluation:  Nutrition Goals Re-Evaluation     Row Name 10/27/22 1008             Goals   Nutrition Goal Drink more water, ~64oz, less sugary beverages       Comment Patient reports that he has cut back on sugary beverages. He usually has 1 soda a day but not he sometimes goes a day or 2 without any. This is the main nutrition goals he has been working on. He has not yet started to work on his other nutition goals.       Expected Outcome Short: try to be more mindful of eating habits and pairing carbs with protein. Long: establish and maintain heart healthy eating habits.         Personal Goal #2 Re-Evaluation   Personal Goal #2 Watch carb portions, pair  carbs with protein at meals         Personal Goal #3 Re-Evaluation   Personal Goal #3 Work on building better habits with new routine                Nutrition Goals Discharge (Final Nutrition Goals Re-Evaluation):  Nutrition Goals Re-Evaluation - 10/27/22 1008       Goals   Nutrition Goal Drink more water, ~64oz, less sugary beverages    Comment Patient reports that he has cut back on sugary beverages. He usually has 1 soda a day but not he sometimes goes a day or 2 without any. This is the main nutrition goals he has been working on. He has not yet started to work on his other nutition goals.    Expected Outcome Short: try to be more mindful of eating habits and pairing carbs with protein. Long: establish and maintain heart healthy eating habits.      Personal Goal #2 Re-Evaluation   Personal Goal #2 Watch carb portions, pair carbs with protein at meals      Personal Goal #3 Re-Evaluation   Personal Goal #3 Work on building better habits with new routine             Psychosocial: Target Goals: Acknowledge presence or absence of significant depression and/or stress, maximize coping skills, provide positive support system.  Participant is able to verbalize types and ability to use techniques and skills needed for reducing stress and depression.   Education: Stress, Anxiety, and Depression - Group verbal and visual presentation to define topics covered.  Reviews how body is impacted by stress, anxiety, and depression.  Also discusses healthy ways to reduce stress and to treat/manage anxiety and depression.  Written material given at graduation.   Education: Sleep Hygiene -Provides group verbal and written instruction about how sleep can affect your health.  Define sleep hygiene, discuss sleep cycles and impact of sleep habits. Review good sleep hygiene tips.    Initial Review & Psychosocial Screening:  Initial Psych Review & Screening - 09/11/22 1344       Family Dynamics    Good Support System? Yes      Barriers   Psychosocial barriers to participate in program There are no identifiable barriers or psychosocial needs.;The patient should benefit from training in stress management and relaxation.      Screening Interventions   Interventions Encouraged to exercise;Provide feedback about the scores to participant;To provide support and resources with identified psychosocial needs    Expected Outcomes Long Term Goal: Stressors or current issues are controlled or eliminated.;Short Term goal: Utilizing psychosocial counselor, staff and physician to assist with identification of specific Stressors or current issues interfering with healing process. Setting desired goal for each stressor or current issue identified.;Long Term goal: The participant improves quality of Life and PHQ9 Scores as seen by post scores and/or verbalization of changes;Short Term goal: Identification and review with participant of any Quality of Life or Depression concerns found by scoring the questionnaire.             Quality of Life Scores:   Quality of Life - 09/15/22 1721       Quality of Life   Select Quality of Life      Quality of Life Scores   Health/Function Pre 24.2 %    Socioeconomic Pre 26.29 %    Psych/Spiritual Pre 24.86 %    Family Pre 27.6 %    GLOBAL Pre 25.39 %            Scores of 19 and below usually indicate a poorer quality of life in these areas.  A difference of  2-3 points is a clinically meaningful difference.  A difference of 2-3 points in the total score of the Quality of Life Index has been associated with significant improvement in overall quality of life, self-image, physical symptoms, and general health in studies assessing change in quality of life.  PHQ-9: Review Flowsheet  More data exists      09/15/2022 12/03/2021 11/23/2020 11/23/2019 10/27/2018  Depression screen PHQ 2/9  Decreased Interest 0 0 0 0 0  Down, Depressed, Hopeless 0 0 0 0 0   PHQ - 2 Score 0 0 0 0 0  Altered sleeping 0 - - - -  Tired, decreased energy 0 - - - -  Change in appetite 0 - - - -  Feeling bad or failure about yourself  0 - - - -  Trouble concentrating 0 - - - -  Moving slowly or fidgety/restless 0 - - - -  Suicidal thoughts 0 - - - -  PHQ-9 Score 0 - - - -    Details           Interpretation of Total Score  Total Score Depression Severity:  1-4 = Minimal depression, 5-9 = Mild depression, 10-14 =  Moderate depression, 15-19 = Moderately severe depression, 20-27 = Severe depression   Psychosocial Evaluation and Intervention:  Psychosocial Evaluation - 09/11/22 1359       Psychosocial Evaluation & Interventions   Interventions Encouraged to exercise with the program and follow exercise prescription    Comments Parker is coming to cardiac rehab post stents. He notes that he can tell a positive difference in his stamina, but wants to improve it more. He has had an episode of chest pain which was relieved with rest. He reports having a good support system and has no stress concerns at this time. He wants to come work on his strength and gain more knowledge about living a heart healthy life.    Expected Outcomes Short: attend cardiac rehab for education and exercise. Long: develop and maintain positive self care habits.    Continue Psychosocial Services  Follow up required by staff             Psychosocial Re-Evaluation:  Psychosocial Re-Evaluation     Row Name 10/27/22 1022             Psychosocial Re-Evaluation   Current issues with None Identified       Comments Patient reports there have not been any changes to in sleep, stress, or mental health, but that he does not sleep enough. This is his usual but he reports that he is often tired during the day and stays up to late trying to get things done. He was encouraged to try to establish a consistent bedtime routine to help aid in preparing the body for bettter sleep.       Expected  Outcomes Short: establish a consistent bedtime routine. Long: maintain good mental health routine.       Interventions Encouraged to attend Cardiac Rehabilitation for the exercise       Continue Psychosocial Services  Follow up required by staff                Psychosocial Discharge (Final Psychosocial Re-Evaluation):  Psychosocial Re-Evaluation - 10/27/22 1022       Psychosocial Re-Evaluation   Current issues with None Identified    Comments Patient reports there have not been any changes to in sleep, stress, or mental health, but that he does not sleep enough. This is his usual but he reports that he is often tired during the day and stays up to late trying to get things done. He was encouraged to try to establish a consistent bedtime routine to help aid in preparing the body for bettter sleep.    Expected Outcomes Short: establish a consistent bedtime routine. Long: maintain good mental health routine.    Interventions Encouraged to attend Cardiac Rehabilitation for the exercise    Continue Psychosocial Services  Follow up required by staff             Vocational Rehabilitation: Provide vocational rehab assistance to qualifying candidates.   Vocational Rehab Evaluation & Intervention:  Vocational Rehab - 09/11/22 1344       Initial Vocational Rehab Evaluation & Intervention   Assessment shows need for Vocational Rehabilitation No             Education: Education Goals: Education classes will be provided on a variety of topics geared toward better understanding of heart health and risk factor modification. Participant will state understanding/return demonstration of topics presented as noted by education test scores.  Learning Barriers/Preferences:  Learning Barriers/Preferences - 09/11/22 1343  Learning Barriers/Preferences   Learning Barriers None    Learning Preferences Individual Instruction             General Cardiac Education  Topics:  AED/CPR: - Group verbal and written instruction with the use of models to demonstrate the basic use of the AED with the basic ABC's of resuscitation.   Anatomy and Cardiac Procedures: - Group verbal and visual presentation and models provide information about basic cardiac anatomy and function. Reviews the testing methods done to diagnose heart disease and the outcomes of the test results. Describes the treatment choices: Medical Management, Angioplasty, or Coronary Bypass Surgery for treating various heart conditions including Myocardial Infarction, Angina, Valve Disease, and Cardiac Arrhythmias.  Written material given at graduation. Flowsheet Row Cardiac Rehab from 09/15/2022 in [place] Cardiac and Pulmonary Rehab  Education need identified 09/15/22       Medication Safety: - Group verbal and visual instruction to review commonly prescribed medications for heart and lung disease. Reviews the medication, class of the drug, and side effects. Includes the steps to properly store meds and maintain the prescription regimen.  Written material given at graduation.   Intimacy: - Group verbal instruction through game format to discuss how heart and lung disease can affect sexual intimacy. Written material given at graduation..   Know Your Numbers and Heart Failure: - Group verbal and visual instruction to discuss disease risk factors for cardiac and pulmonary disease and treatment options.  Reviews associated critical values for Overweight/Obesity, Hypertension, Cholesterol, and Diabetes.  Discusses basics of heart failure: signs/symptoms and treatments.  Introduces Heart Failure Zone chart for action plan for heart failure.  Written material given at graduation.   Infection Prevention: - Provides verbal and written material to individual with discussion of infection control including proper hand washing and proper equipment cleaning during exercise session. Flowsheet Row Cardiac Rehab  from 09/15/2022 in [facility] Cardiac and Pulmonary Rehab  Date 09/15/22  Educator MB  Instruction Review Code 1- Verbalizes Understanding       Falls Prevention: - Provides verbal and written material to individual with discussion of falls prevention and safety. Flowsheet Row Cardiac Rehab from 09/15/2022 in [facility] Cardiac and Pulmonary Rehab  Date 09/15/22  Educator MB  Instruction Review Code 1- Verbalizes Understanding       Other: -Provides group and verbal instruction on various topics (see comments)   Knowledge Questionnaire Score:  Knowledge Questionnaire Score - 09/15/22 1724       Knowledge Questionnaire Score   Pre Score 23/26             Core Components/Risk Factors/Patient Goals at Admission:  Personal Goals and Risk Factors at Admission - 09/15/22 1725       Core Components/Risk Factors/Patient Goals on Admission    Weight Management Yes    Intervention Weight Management: Provide education and appropriate resources to help participant work on and attain dietary goals.;Weight Management: Develop a combined nutrition and exercise program designed to reach desired caloric intake, while maintaining appropriate intake of nutrient and fiber, sodium and fats, and appropriate energy expenditure required for the weight goal.    Expected Outcomes Weight Maintenance: Understanding of the daily nutrition guidelines, which includes 25-35% calories from fat, 7% or less cal from saturated fats, less than 200mg  cholesterol, less than 1.5gm of sodium, & 5 or more servings of fruits and vegetables daily;Understanding recommendations for meals to include 15-35% energy as protein, 25-35% energy from fat, 35-60% energy from carbohydrates, less than 200mg  of dietary  cholesterol, 20-35 gm of total fiber daily;Understanding of distribution of calorie intake throughout the day with the consumption of 4-5 meals/snacks;Short Term: Continue to assess and modify interventions until short term weight  is achieved;Long Term: Adherence to nutrition and physical activity/exercise program aimed toward attainment of established weight goal    Lipids Yes    Intervention Provide education and support for participant on nutrition & aerobic/resistive exercise along with prescribed medications to achieve LDL 70mg , HDL >40mg .    Expected Outcomes Short Term: Participant states understanding of desired cholesterol values and is compliant with medications prescribed. Participant is following exercise prescription and nutrition guidelines.;Long Term: Cholesterol controlled with medications as prescribed, with individualized exercise RX and with personalized nutrition plan. Value goals: LDL < 70mg , HDL > 40 mg.             Education:Diabetes - Individual verbal and written instruction to review signs/symptoms of diabetes, desired ranges of glucose level fasting, after meals and with exercise. Acknowledge that pre and post exercise glucose checks will be done for 3 sessions at entry of program.   Core Components/Risk Factors/Patient Goals Review:   Goals and Risk Factor Review     Row Name 10/27/22 1013             Core Components/Risk Factors/Patient Goals Review   Personal Goals Review Weight Management/Obesity;Lipids       Review Patient reports that he takes all his meds without any probelems and follows up with MD any concerns arise. His weight has been maintained.       Expected Outcomes Short: continue to attend cardiac rehab for risk factor reduction. Continue to take all meds and manage cholesterol with guidance from MD. Long: maintain healthy weight and control cardiac risk factos.                Core Components/Risk Factors/Patient Goals at Discharge (Final Review):   Goals and Risk Factor Review - 10/27/22 1013       Core Components/Risk Factors/Patient Goals Review   Personal Goals Review Weight Management/Obesity;Lipids    Review Patient reports that he takes all his meds  without any probelems and follows up with MD any concerns arise. His weight has been maintained.    Expected Outcomes Short: continue to attend cardiac rehab for risk factor reduction. Continue to take all meds and manage cholesterol with guidance from MD. Long: maintain healthy weight and control cardiac risk factos.             ITP Comments:  ITP Comments     Row Name 09/11/22 1341 09/15/22 1734 09/22/22 1054 10/01/22 1038 10/29/22 1213   ITP Comments Initial phone call completed. Diagnosis can be found in [facility] 7/25. EP Orientation scheduled for Monday 8/19 at 2:30. Completed and gym orientation. Initial ITP created and sent for review to Dr. Bethann Punches, Medical Director. First full day of exercise!  Patient was oriented to gym and equipment including functions, settings, policies, and procedures.  Patient's individual exercise prescription and treatment plan were reviewed.  All starting workloads were established based on the results of the 6 minute walk test done at initial orientation visit.  The plan for exercise progression was also introduced and progression will be customized based on patient's performance and goals. 30 Day review completed. Medical Director ITP review done, changes made as directed, and signed approval by Medical Director.     new to program 30 Day review completed. Medical Director ITP review done, changes made as  directed, and signed approval by Medical Director.            Comments:

## 2022-10-29 NOTE — Progress Notes (Signed)
Daily Session Note  Patient Details  Name: Willie Griffin MRN: [id number] Date of Birth: 11/05/1955 Referring Provider:   Flowsheet Row Cardiac Rehab from 09/15/2022 in [facility] Cardiac and Pulmonary Rehab  Referring Provider Dr.Arida       Encounter Date: 10/29/2022  Check In:  Session Check In - 10/29/22 1008       Check-In   Supervising physician immediately available to respond to emergencies See telemetry face sheet for immediately available ER MD    Location [facility]-Cardiac & Pulmonary Rehab    Staff Present Rory Percy, MS, Exercise Physiologist;Joseph Shelbie Proctor, RN, [place]    Virtual Visit No    Medication changes reported     No    Fall or balance concerns reported    No    Warm-up and Cool-down Performed on first and last piece of equipment    Resistance Training Performed Yes    VAD Patient? No    PAD/SET Patient? No      Pain Assessment   Currently in Pain? No/denies                Social History   Tobacco Use  Smoking Status Never   Passive exposure: Past  Smokeless Tobacco Never    Goals Met:  Independence with exercise equipment Exercise tolerated well No report of concerns or symptoms today Strength training completed today  Goals Unmet:  Not Applicable  Comments: Pt able to follow exercise prescription today without complaint.  Will continue to monitor for progression.    Dr. Bethann Punches is Medical Director for [facility] Cardiac Rehabilitation.  Dr. Vida Rigger is Medical Director for [facility] Pulmonary Rehabilitation.

## 2022-10-31 ENCOUNTER — Encounter: Payer: Medicare Other | Admitting: *Deleted

## 2022-10-31 DIAGNOSIS — Z955 Presence of coronary angioplasty implant and graft: Secondary | ICD-10-CM

## 2022-10-31 DIAGNOSIS — Z48812 Encounter for surgical aftercare following surgery on the circulatory system: Secondary | ICD-10-CM | POA: Diagnosis not present

## 2022-10-31 NOTE — Progress Notes (Signed)
Daily Session Note  Patient Details  Name: Willie Griffin MRN: [id number] Date of Birth: [birth year] Referring Provider:   Flowsheet Row Cardiac Rehab from 09/15/2022 in [facility] Cardiac and Pulmonary Rehab  Referring Provider Dr.Arida       Encounter Date: 10/31/2022  Check In:  Session Check In - 10/31/22 1000       Check-In   Supervising physician immediately available to respond to emergencies See telemetry face sheet for immediately available ER MD    Location [facility]-Cardiac & Pulmonary Rehab    Staff Present Elige Ko, RCP,RRT,BSRT;Noah Tickle, BS, Exercise Physiologist;Aiza Vollrath, RN, BSN, CCRP    Virtual Visit No    Medication changes reported     No    Fall or balance concerns reported    No    Warm-up and Cool-down Performed on first and last piece of equipment    Resistance Training Performed Yes    VAD Patient? No    PAD/SET Patient? No      Pain Assessment   Currently in Pain? No/denies                Social History   Tobacco Use  Smoking Status Never   Passive exposure: Past  Smokeless Tobacco Never    Goals Met:  Independence with exercise equipment Exercise tolerated well No report of concerns or symptoms today  Goals Unmet:  Not Applicable  Comments: Pt able to follow exercise prescription today without complaint.  Will continue to monitor for progression.    Dr. Bethann Punches is Medical Director for [facility] Cardiac Rehabilitation.  Dr. Vida Rigger is Medical Director for [facility] Pulmonary Rehabilitation.

## 2022-11-03 ENCOUNTER — Encounter: Payer: Medicare Other | Admitting: *Deleted

## 2022-11-03 DIAGNOSIS — Z48812 Encounter for surgical aftercare following surgery on the circulatory system: Secondary | ICD-10-CM | POA: Diagnosis not present

## 2022-11-03 DIAGNOSIS — Z955 Presence of coronary angioplasty implant and graft: Secondary | ICD-10-CM

## 2022-11-03 NOTE — Progress Notes (Signed)
Daily Session Note  Patient Details  Name: MANSFIELD DANN MRN: [id number] Date of Birth: 1955/06/26 Referring Provider:   Flowsheet Row Cardiac Rehab from 09/15/2022 in [facility] Cardiac and Pulmonary Rehab  Referring Provider Dr.Arida       Encounter Date: 11/03/2022  Check In:  Session Check In - 11/03/22 0926       Check-In   Supervising physician immediately available to respond to emergencies See telemetry face sheet for immediately available ER MD    Location [facility]-Cardiac & Pulmonary Rehab    Staff Present Tommye Standard, BS, ACSM CEP, Exercise Physiologist;Joniah Bednarski Katrinka Blazing, RN, ADN;Susanne Bice, RN, BSN, CCRP;Maxon Conetta BS, , Exercise Physiologist    Virtual Visit No    Medication changes reported     No    Fall or balance concerns reported    No    Warm-up and Cool-down Performed on first and last piece of equipment    Resistance Training Performed Yes    VAD Patient? No    PAD/SET Patient? No      Pain Assessment   Currently in Pain? No/denies                Social History   Tobacco Use  Smoking Status Never   Passive exposure: Past  Smokeless Tobacco Never    Goals Met:  Independence with exercise equipment Exercise tolerated well No report of concerns or symptoms today Strength training completed today  Goals Unmet:  Not Applicable  Comments: Pt able to follow exercise prescription today without complaint.  Will continue to monitor for progression.    Dr. Bethann Punches is Medical Director for [facility] Cardiac Rehabilitation.  Dr. Vida Rigger is Medical Director for [facility] Pulmonary Rehabilitation.

## 2022-11-05 ENCOUNTER — Encounter: Payer: Medicare Other | Admitting: *Deleted

## 2022-11-05 DIAGNOSIS — Z955 Presence of coronary angioplasty implant and graft: Secondary | ICD-10-CM

## 2022-11-05 DIAGNOSIS — Z48812 Encounter for surgical aftercare following surgery on the circulatory system: Secondary | ICD-10-CM | POA: Diagnosis not present

## 2022-11-05 NOTE — Progress Notes (Signed)
Daily Session Note  Patient Details  Name: Willie Griffin MRN: [id number] Date of Birth: [birth year] Referring Provider:   Flowsheet Row Cardiac Rehab from 09/15/2022 in [facility] Cardiac and Pulmonary Rehab  Referring Provider Dr.Arida       Encounter Date: 11/05/2022  Check In:  Session Check In - 11/05/22 0924       Check-In   Location [facility]-Cardiac & Pulmonary Rehab    Staff Present Rory Percy, MS, Exercise Physiologist;Joseph Reino Kent, RCP,RRT,BSRT;Maxon [place] BS, , Exercise Physiologist;Decarla Siemen Katrinka Blazing, RN, [place]    Virtual Visit No    Medication changes reported     No    Fall or balance concerns reported    No    Warm-up and Cool-down Performed on first and last piece of equipment    Resistance Training Performed Yes    VAD Patient? No    PAD/SET Patient? No      Pain Assessment   Currently in Pain? No/denies                Social History   Tobacco Use  Smoking Status Never   Passive exposure: Past  Smokeless Tobacco Never    Goals Met:  Independence with exercise equipment Exercise tolerated well No report of concerns or symptoms today Strength training completed today  Goals Unmet:  Not Applicable  Comments: Pt able to follow exercise prescription today without complaint.  Will continue to monitor for progression.    Dr. Bethann Punches is Medical Director for [facility] Cardiac Rehabilitation.  Dr. Vida Rigger is Medical Director for [facility] Pulmonary Rehabilitation.

## 2022-11-07 ENCOUNTER — Encounter: Payer: Medicare Other | Admitting: *Deleted

## 2022-11-07 DIAGNOSIS — Z955 Presence of coronary angioplasty implant and graft: Secondary | ICD-10-CM

## 2022-11-07 DIAGNOSIS — Z48812 Encounter for surgical aftercare following surgery on the circulatory system: Secondary | ICD-10-CM | POA: Diagnosis not present

## 2022-11-07 NOTE — Progress Notes (Signed)
Daily Session Note  Patient Details  Name: CORRIGAN KRETSCHMER MRN: [id number] Date of Birth: Dec 05, 1955 Referring Provider:   Flowsheet Row Cardiac Rehab from 09/15/2022 in [facility] Cardiac and Pulmonary Rehab  Referring Provider Dr.Arida       Encounter Date: 11/07/2022  Check In:  Session Check In - 11/07/22 1025       Check-In   Supervising physician immediately available to respond to emergencies See telemetry face sheet for immediately available ER MD    Location [facility]-Cardiac & Pulmonary Rehab    Staff Present Ronette Deter, BS, Exercise Physiologist;Maxon Conetta BS, , Exercise Physiologist;Zylen Wenig Karleen [place] RN, BSN;Margaret Best, MS, Exercise Physiologist    Virtual Visit No    Medication changes reported     No    Fall or balance concerns reported    No    Warm-up and Cool-down Performed on first and last piece of equipment    Resistance Training Performed Yes    VAD Patient? No    PAD/SET Patient? No      Pain Assessment   Currently in Pain? No/denies                Social History   Tobacco Use  Smoking Status Never   Passive exposure: Past  Smokeless Tobacco Never    Goals Met:  Independence with exercise equipment Exercise tolerated well No report of concerns or symptoms today Strength training completed today  Goals Unmet:  Not Applicable  Comments: Pt able to follow exercise prescription today without complaint.  Will continue to monitor for progression.    Dr. Bethann Punches is Medical Director for [facility] Cardiac Rehabilitation.  Dr. Vida Rigger is Medical Director for [facility] Pulmonary Rehabilitation.

## 2022-11-10 ENCOUNTER — Encounter: Payer: Medicare Other | Admitting: *Deleted

## 2022-11-12 ENCOUNTER — Encounter: Payer: Medicare Other | Admitting: *Deleted

## 2022-11-12 DIAGNOSIS — Z48812 Encounter for surgical aftercare following surgery on the circulatory system: Secondary | ICD-10-CM | POA: Diagnosis not present

## 2022-11-12 DIAGNOSIS — Z955 Presence of coronary angioplasty implant and graft: Secondary | ICD-10-CM

## 2022-11-12 NOTE — Progress Notes (Signed)
Daily Session Note  Patient Details  Name: Willie Griffin MRN: [id number] Date of Birth: 1955/10/19 Referring Provider:   Flowsheet Row Cardiac Rehab from 09/15/2022 in [facility] Cardiac and Pulmonary Rehab  Referring Provider Dr.Arida       Encounter Date: 11/12/2022  Check In:  Session Check In - 11/12/22 0925       Check-In   Supervising physician immediately available to respond to emergencies See telemetry face sheet for immediately available ER MD    Location [facility]-Cardiac & Pulmonary Rehab    Staff Present Cora Collum, RN, BSN, CCRP;Joseph Hood, RCP,RRT,BSRT;Maxon [place] BS, , Exercise Physiologist;Reema Chick Katrinka Blazing, RN, [place]    Virtual Visit No    Medication changes reported     No    Fall or balance concerns reported    No    Warm-up and Cool-down Performed on first and last piece of equipment    Resistance Training Performed Yes    VAD Patient? No    PAD/SET Patient? No      Pain Assessment   Currently in Pain? No/denies                Social History   Tobacco Use  Smoking Status Never   Passive exposure: Past  Smokeless Tobacco Never    Goals Met:  Independence with exercise equipment Exercise tolerated well No report of concerns or symptoms today Strength training completed today  Goals Unmet:  Not Applicable  Comments: Pt able to follow exercise prescription today without complaint.  Will continue to monitor for progression.    Dr. Bethann Punches is Medical Director for [facility] Cardiac Rehabilitation.  Dr. Vida Rigger is Medical Director for [facility] Pulmonary Rehabilitation.

## 2022-11-17 ENCOUNTER — Encounter: Payer: Medicare Other | Admitting: *Deleted

## 2022-11-17 DIAGNOSIS — Z48812 Encounter for surgical aftercare following surgery on the circulatory system: Secondary | ICD-10-CM | POA: Diagnosis not present

## 2022-11-17 DIAGNOSIS — Z955 Presence of coronary angioplasty implant and graft: Secondary | ICD-10-CM

## 2022-11-17 NOTE — Progress Notes (Signed)
Daily Session Note  Patient Details  Name: Willie Griffin MRN: [id number] Date of Birth: [birth year] Referring Provider:   Flowsheet Row Cardiac Rehab from 09/15/2022 in [facility] Cardiac and Pulmonary Rehab  Referring Provider Dr.Arida       Encounter Date: 11/17/2022  Check In:  Session Check In - 11/17/22 7829       Check-In   Supervising physician immediately available to respond to emergencies See telemetry face sheet for immediately available ER MD    Location [facility]-Cardiac & Pulmonary Rehab    Staff Present Cora Collum, RN, BSN, CCRP;Margaret Best, MS, Exercise Physiologist;Maxon Conetta BS, , Exercise Physiologist;Kelly Madilyn Fireman, BS, ACSM CEP, Exercise Physiologist    Virtual Visit No    Medication changes reported     No    Fall or balance concerns reported    No    Warm-up and Cool-down Performed on first and last piece of equipment    Resistance Training Performed Yes    VAD Patient? No    PAD/SET Patient? No      Pain Assessment   Currently in Pain? No/denies                Social History   Tobacco Use  Smoking Status Never   Passive exposure: Past  Smokeless Tobacco Never    Goals Met:  Independence with exercise equipment Exercise tolerated well No report of concerns or symptoms today  Goals Unmet:  Not Applicable  Comments: Pt able to follow exercise prescription today without complaint.  Will continue to monitor for progression.    Dr. Bethann Punches is Medical Director for [facility] Cardiac Rehabilitation.  Dr. Vida Rigger is Medical Director for [facility] Pulmonary Rehabilitation.

## 2022-11-19 ENCOUNTER — Encounter: Payer: Medicare Other | Admitting: *Deleted

## 2022-11-19 DIAGNOSIS — Z955 Presence of coronary angioplasty implant and graft: Secondary | ICD-10-CM

## 2022-11-19 DIAGNOSIS — Z48812 Encounter for surgical aftercare following surgery on the circulatory system: Secondary | ICD-10-CM | POA: Diagnosis not present

## 2022-11-19 NOTE — Progress Notes (Signed)
Daily Session Note  Patient Details  Name: Willie Griffin MRN: [id number] Date of Birth: [birth year] Referring Provider:   Flowsheet Row Cardiac Rehab from 09/15/2022 in [facility] Cardiac and Pulmonary Rehab  Referring Provider Dr.Arida       Encounter Date: 11/19/2022  Check In:  Session Check In - 11/19/22 4098       Check-In   Supervising physician immediately available to respond to emergencies See telemetry face sheet for immediately available ER MD    Location [facility]-Cardiac & Pulmonary Rehab    Staff Present Cora Collum, RN, BSN, CCRP;Joseph Hood, RCP,RRT,BSRT;Maxon [place] BS, , Exercise Physiologist;Charrisse Masley Katrinka Blazing, RN, [place]    Virtual Visit No    Medication changes reported     No    Fall or balance concerns reported    No    Warm-up and Cool-down Performed on first and last piece of equipment    Resistance Training Performed Yes    VAD Patient? No    PAD/SET Patient? No      Pain Assessment   Currently in Pain? No/denies                Social History   Tobacco Use  Smoking Status Never   Passive exposure: Past  Smokeless Tobacco Never    Goals Met:  Independence with exercise equipment Exercise tolerated well No report of concerns or symptoms today Strength training completed today  Goals Unmet:  Not Applicable  Comments: Pt able to follow exercise prescription today without complaint.  Will continue to monitor for progression.    Dr. Bethann Punches is Medical Director for [facility] Cardiac Rehabilitation.  Dr. Vida Rigger is Medical Director for [facility] Pulmonary Rehabilitation.

## 2022-11-21 ENCOUNTER — Encounter: Payer: Medicare Other | Admitting: *Deleted

## 2022-11-21 DIAGNOSIS — Z48812 Encounter for surgical aftercare following surgery on the circulatory system: Secondary | ICD-10-CM | POA: Diagnosis not present

## 2022-11-21 DIAGNOSIS — Z955 Presence of coronary angioplasty implant and graft: Secondary | ICD-10-CM

## 2022-11-21 NOTE — Progress Notes (Signed)
Daily Session Note  Patient Details  Name: Willie Griffin MRN: [id number] Date of Birth: 1955-09-10 Referring Provider:   Flowsheet Row Cardiac Rehab from 09/15/2022 in [facility] Cardiac and Pulmonary Rehab  Referring Provider Dr.Arida       Encounter Date: 11/21/2022  Check In:  Session Check In - 11/21/22 4098       Check-In   Supervising physician immediately available to respond to emergencies See telemetry face sheet for immediately available ER MD    Location [facility]-Cardiac & Pulmonary Rehab    Staff Present Elige Ko, RCP,RRT,BSRT;Cora Collum, RN, BSN, CCRP;Noah Tickle, BS, Exercise Physiologist    Virtual Visit No    Medication changes reported     No    Fall or balance concerns reported    No    Warm-up and Cool-down Performed on first and last piece of equipment    Resistance Training Performed Yes    VAD Patient? No    PAD/SET Patient? No      Pain Assessment   Currently in Pain? No/denies                Social History   Tobacco Use  Smoking Status Never   Passive exposure: Past  Smokeless Tobacco Never    Goals Met:  Independence with exercise equipment Exercise tolerated well No report of concerns or symptoms today  Goals Unmet:  Not Applicable  Comments: Pt able to follow exercise prescription today without complaint.  Will continue to monitor for progression.    Dr. Bethann Punches is Medical Director for [facility] Cardiac Rehabilitation.  Dr. Vida Rigger is Medical Director for [facility] Pulmonary Rehabilitation.

## 2022-11-24 ENCOUNTER — Encounter: Payer: Medicare Other | Admitting: *Deleted

## 2022-11-24 DIAGNOSIS — Z48812 Encounter for surgical aftercare following surgery on the circulatory system: Secondary | ICD-10-CM | POA: Diagnosis not present

## 2022-11-24 DIAGNOSIS — Z955 Presence of coronary angioplasty implant and graft: Secondary | ICD-10-CM

## 2022-11-24 NOTE — Progress Notes (Signed)
Daily Session Note  Patient Details  Name: RUAIRI BRASUELL MRN: [id number] Date of Birth: [birth year] Referring Provider:   Flowsheet Row Cardiac Rehab from 09/15/2022 in [facility] Cardiac [facility] Pul[facility] Re[facility]  Referring Provider Dr.Arida       Encounter Date: 11/24/2022  Check In:  Session Check In - 11/24/22 0939       Check-In   Supervising physician immediately available to respond to emergencies See telemetry face sheet for immediately available ER MD    Location [facility]-Cardiac & Pulmonary Rehab    Staff Present Cora Collum, RN, BSN, Matthew Saras, BS, ACSM CEP, Exercise Physiologist;Maxon Conetta BS, , Exercise Physiologist;Margaret Best, MS, Exercise Physiologist;Meredith Jewel Baize, RN BSN    Virtual Visit No    Medication changes reported     No    Fall or balance concerns reported    No    Warm-up and Cool-down Performed on first and last piece of equipment    Resistance Training Performed Yes    VAD Patient? No    PAD/SET Patient? No      Pain Assessment   Currently in Pain? No/denies                Social History   Tobacco Use  Smoking Status Never   Passive exposure: Past  Smokeless Tobacco Never    Goals Met:  Proper associated with RPD/PD & O2 Sat Exercise tolerated well No report of concerns or symptoms today  Goals Unmet:  Not Applicable  Comments: Pt able to follow exercise prescription today without complaint.  Will continue to monitor for progression.  Reviewed home exercise with pt today.  Pt plans to walk at home for exercise.  Reviewed THR, pulse, RPE, sign and symptoms, pulse oximetery and when to call 911 or MD.  Also discussed weather considerations and indoor options.  Pt voiced understanding.     Dr. Bethann Punches is Medical Director for [facility] Cardiac Rehabilitation.  Dr. Vida Rigger is Medical Director for [facility] Pulmonary Rehabilitation.

## 2022-11-26 ENCOUNTER — Encounter: Payer: Medicare Other | Admitting: *Deleted

## 2022-11-26 ENCOUNTER — Encounter: Payer: Self-pay | Admitting: *Deleted

## 2022-11-26 DIAGNOSIS — Z955 Presence of coronary angioplasty implant and graft: Secondary | ICD-10-CM

## 2022-11-26 DIAGNOSIS — Z48812 Encounter for surgical aftercare following surgery on the circulatory system: Secondary | ICD-10-CM | POA: Diagnosis not present

## 2022-11-26 NOTE — Progress Notes (Signed)
Cardiac Individual Treatment Plan  Patient Details  Name: Willie Griffin MRN: [id number] Date of Birth: [birth year] Referring Provider:   Flowsheet Row Cardiac Rehab from 09/15/2022 in [facility] Cardiac and Pulmonary Rehab  Referring Provider Dr.Arida       Initial Encounter Date:  Flowsheet Row Cardiac Rehab from 09/15/2022 in [facility] Cardiac and Pulmonary Rehab  Date 09/15/22       Visit Diagnosis: Status post coronary artery stent placement  Patient's Home Medications on Admission:  Current Outpatient Medications:    aspirin EC 81 MG tablet, Take 1 tablet (81 mg total) by mouth daily. Swallow whole., Disp: 90 tablet, Rfl: 3   atorvastatin (LIPITOR) 20 MG tablet, Take 1 tablet (20 mg total) by mouth daily., Disp: 90 tablet, Rfl: 3   clopidogrel (PLAVIX) 75 MG tablet, Take 1 tablet (75 mg total) by mouth daily with breakfast., Disp: 30 tablet, Rfl: 11   Cyanocobalamin (VITAMIN B-12 PO), Take 2 Doses by mouth daily., Disp: , Rfl:    EPIPEN 2-PAK 0.3 MG/0.3ML SOAJ injection, INJECT 0.3 MG INTRAMUSCULARLY ONCE AS NEEDED FOR UP TO 1 DOSE, Disp: 2 each, Rfl: 1   hydrocortisone 2.5 % cream, Use rectally twice daily as neeed, Disp: 30 g, Rfl: 5   levothyroxine (SYNTHROID) 25 MCG tablet, TAKE 1 TABLET BY MOUTH EVERY DAY BEFORE BREAKFAST, Disp: 90 tablet, Rfl: 3   OVER THE COUNTER MEDICATION, Take 2 capsules by mouth daily. Synoviox includes MSM, turmeric, and hyaluronic acid, Disp: , Rfl:    pantoprazole (PROTONIX) 40 MG tablet, Take 1 tablet (40 mg total) by mouth daily., Disp: 30 tablet, Rfl: 5   SOOLANTRA 1 % CREA, Apply topically daily. to face, Disp: , Rfl: 5   tadalafil (CIALIS) 20 MG tablet, Take 0.5-1 tablets (10-20 mg total) by mouth every other day as needed for erectile dysfunction., Disp: 10 tablet, Rfl: 11  Past Medical History: Past Medical History:  Diagnosis Date   Allergy    allergic rhinitis   GERD (gastroesophageal reflux disease)    Hemorrhoid    Hypothyroidism     Peyronie disease     Tobacco Use: Social History   Tobacco Use  Smoking Status Never   Passive exposure: Past  Smokeless Tobacco Never    Labs: Review Flowsheet  More data exists      Latest Ref Rng & Units 11/23/2019 09/12/2020 11/23/2020 04/08/2021 12/03/2021  Labs for ITP Cardiac and Pulmonary Rehab  Cholestrol 0 - 200 mg/dL 235  573  220  254  270   LDL (calc) 0 - 99 mg/dL 623  762  87  94  99   HDL-C >39.00 mg/dL 83.15  37  17.61  38  60.73   Trlycerides 0.0 - 149.0 mg/dL 71.0  96  62.6  95  94.8     Details             Exercise Target Goals: Exercise Program Goal: Individual exercise prescription set using results from initial 6 min walk test and THRR while considering  patient's activity barriers and safety.   Exercise Prescription Goal: Initial exercise prescription builds to 30-45 minutes a day of aerobic activity, 2-3 days per week.  Home exercise guidelines will be given to patient during program as part of exercise prescription that the participant will acknowledge.   Education: Aerobic Exercise: - Group verbal and visual presentation on the components of exercise prescription. Introduces F.I.T.T principle from ACSM for exercise prescriptions.  Reviews F.I.T.T. principles of aerobic exercise including progression.  Written material given at graduation.   Education: Resistance Exercise: - Group verbal and visual presentation on the components of exercise prescription. Introduces F.I.T.T principle from ACSM for exercise prescriptions  Reviews F.I.T.T. principles of resistance exercise including progression. Written material given at graduation.    Education: Exercise & Equipment Safety: - Individual verbal instruction and demonstration of equipment use and safety with use of the equipment. Flowsheet Row Cardiac Rehab from 09/15/2022 in [facility] Cardiac and Pulmonary Rehab  Date 09/15/22  Educator MB  Instruction Review Code 1- Verbalizes Understanding        Education: Exercise Physiology & General Exercise Guidelines: - Group verbal and written instruction with models to review the exercise physiology of the cardiovascular system and associated critical values. Provides general exercise guidelines with specific guidelines to those with heart or lung disease.  Flowsheet Row Cardiac Rehab from 09/15/2022 in [facility] Cardiac and Pulmonary Rehab  Education need identified 09/15/22       Education: Flexibility, Balance, Mind/Body Relaxation: - Group verbal and visual presentation with interactive activity on the components of exercise prescription. Introduces F.I.T.T principle from ACSM for exercise prescriptions. Reviews F.I.T.T. principles of flexibility and balance exercise training including progression. Also discusses the mind body connection.  Reviews various relaxation techniques to help reduce and manage stress (i.e. Deep breathing, progressive muscle relaxation, and visualization). Balance handout provided to take home. Written material given at graduation.   Activity Barriers & Risk Stratification:  Activity Barriers & Cardiac Risk Stratification - 09/15/22 1713       Activity Barriers & Cardiac Risk Stratification   Activity Barriers Back Problems;Joint Problems    Cardiac Risk Stratification Moderate             6 Minute Walk:  6 Minute Walk     Row Name 09/15/22 1711         6 Minute Walk   Phase Initial     Distance 1440 feet     Walk Time 6 minutes     MPH 2.72     METS 3.59     RPE 12     Perceived Dyspnea  0     VO2 Peak 12.56     Symptoms Yes (comment)     Comments Knee pain and Left hip pain     Resting HR 60 bpm     Resting BP 120/60     Resting Oxygen Saturation  97 %     Exercise Oxygen Saturation  during 6 min walk 67 %     Max Ex. HR 102 bpm     Max Ex. BP 140/72     2 Minute Post BP 126/66              Oxygen Initial Assessment:   Oxygen Re-Evaluation:   Oxygen Discharge (Final Oxygen  Re-Evaluation):   Initial Exercise Prescription:  Initial Exercise Prescription - 11/20/22 1000       Treadmill   METs 3.72      Recumbant Bike   Watts 48    METs 3.9      NuStep   METs 3.9      REL-XR   Level 7    Minutes 15    METs 6.8             Perform Capillary Blood Glucose checks as needed.  Exercise Prescription Changes:   Exercise Prescription Changes     Row Name 09/15/22 1700 11/20/22 1000 11/24/22 0900  Response to Exercise   Blood Pressure (Admit) 120/60 106/68 --     Blood Pressure (Exercise) 140/72 -- --     Blood Pressure (Exit) 126/66 108/58 --     Heart Rate (Admit) 60 bpm 69 bpm --     Heart Rate (Exercise) 102 bpm 122 bpm --     Heart Rate (Exit) 68 bpm 82 bpm --     Oxygen Saturation (Admit) 97 % -- --     Oxygen Saturation (Exercise) 97 % -- --     Oxygen Saturation (Exit) 97 % -- --     Rating of Perceived Exertion (Exercise) 12 14 --     Perceived Dyspnea (Exercise) 0 0 --     Symptoms knee pain and left hip pain -- --     Comments results -- --     Duration Progress to 30 minutes of  aerobic without signs/symptoms of physical distress Progress to 30 minutes of  aerobic without signs/symptoms of physical distress Progress to 30 minutes of  aerobic without signs/symptoms of physical distress     Intensity -- THRR unchanged THRR New  98-136       Progression   Progression -- Continue to progress workloads to maintain intensity without signs/symptoms of physical distress. Continue to progress workloads to maintain intensity without signs/symptoms of physical distress.     Average METs -- 4.25 4.25       Resistance Training   Training Prescription -- Yes Yes     Weight -- 5 lb 5 lb     Reps -- 10-15 10-15       Interval Training   Interval Training -- No No       Treadmill   MPH -- 2.8 2.8     Grade -- 1.5 1.5     Minutes -- 15 15     METs -- -- 3.72       Recumbant Bike   Level -- 2.5  48 watt 2.5  48 watt      Watts -- -- 48     Minutes -- 15 15     METs -- -- 3.9       NuStep   Level -- 4 4     Minutes -- 15 15     METs -- -- 3.9       REL-XR   Level -- -- 7     Minutes -- -- 15     METs -- -- 6.8       Oxygen   Maintain Oxygen Saturation -- 88% or higher 88% or higher              Exercise Comments:   Exercise Comments     Row Name 09/22/22 1054           Exercise Comments First full day of exercise!  Patient was oriented to gym and equipment including functions, settings, policies, and procedures.  Patient's individual exercise prescription and treatment plan were reviewed.  All starting workloads were established based on the results of the 6 minute walk test done at initial orientation visit.  The plan for exercise progression was also introduced and progression will be customized based on patient's performance and goals.                Exercise Goals and Review:   Exercise Goals     Row Name 09/15/22 1719  Exercise Goals   Increase Physical Activity Yes       Intervention Provide advice, education, support and counseling about physical activity/exercise needs.;Develop an individualized exercise prescription for aerobic and resistive training based on initial evaluation findings, risk stratification, comorbidities and participant's personal goals.       Expected Outcomes Short Term: Attend rehab on a regular basis to increase amount of physical activity.;Long Term: Exercising regularly at least 3-5 days a week.;Long Term: Add in home exercise to make exercise part of routine and to increase amount of physical activity.       Increase Strength and Stamina Yes       Intervention Provide advice, education, support and counseling about physical activity/exercise needs.;Develop an individualized exercise prescription for aerobic and resistive training based on initial evaluation findings, risk stratification, comorbidities and participant's personal  goals.       Expected Outcomes Short Term: Increase workloads from initial exercise prescription for resistance, speed, and METs.;Short Term: Perform resistance training exercises routinely during rehab and add in resistance training at home;Long Term: Improve cardiorespiratory fitness, muscular endurance and strength as measured by increased METs and functional capacity ( )       Able to understand and use rate of perceived exertion (RPE) scale Yes       Intervention Provide education and explanation on how to use RPE scale       Expected Outcomes Short Term: Able to use RPE daily in rehab to express subjective intensity level;Long Term:  Able to use RPE to guide intensity level when exercising independently       Able to understand and use Dyspnea scale Yes       Intervention Provide education and explanation on how to use Dyspnea scale       Expected Outcomes Short Term: Able to use Dyspnea scale daily in rehab to express subjective sense of shortness of breath during exertion;Long Term: Able to use Dyspnea scale to guide intensity level when exercising independently       Knowledge and understanding of Target Heart Rate Range (THRR) Yes       Intervention Provide education and explanation of THRR including how the numbers were predicted and where they are located for reference       Expected Outcomes Short Term: Able to state/look up THRR;Long Term: Able to use THRR to govern intensity when exercising independently;Short Term: Able to use daily as guideline for intensity in rehab       Able to check pulse independently Yes       Intervention Provide education and demonstration on how to check pulse in carotid and radial arteries.;Review the importance of being able to check your own pulse for safety during independent exercise       Expected Outcomes Short Term: Able to explain why pulse checking is important during independent exercise;Long Term: Able to check pulse independently and accurately        Understanding of Exercise Prescription Yes       Intervention Provide education, explanation, and written materials on patient's individual exercise prescription       Expected Outcomes Short Term: Able to explain program exercise prescription;Long Term: Able to explain home exercise prescription to exercise independently                Exercise Goals Re-Evaluation :  Exercise Goals Re-Evaluation     Row Name 09/22/22 1054 11/20/22 1049 11/24/22 0946         Exercise Goal Re-Evaluation  Exercise Goals Review Able to understand and use rate of perceived exertion (RPE) scale;Able to understand and use Dyspnea scale;Knowledge and understanding of Target Heart Rate Range (THRR);Understanding of Exercise Prescription Increase Physical Activity;Increase Strength and Stamina;Understanding of Exercise Prescription Increase Strength and Stamina;Increase Physical Activity;Able to understand and use rate of perceived exertion (RPE) scale;Able to understand and use Dyspnea scale;Knowledge and understanding of Target Heart Rate Range (THRR);Able to check pulse independently;Understanding of Exercise Prescription     Comments Reviewed RPE  and dyspnea scale, THR and program prescription with pt today.  Pt voiced understanding and was given a copy of goals to take home. Willie Griffin continues to do well in rehab. He has been able to maintain his intensity on the XR as well as the Treadmill. We will continue to monitor his progress in the program. Reviewed home exercise with pt today.  Pt plans to walk at home for exercise.  Reviewed THR, pulse, RPE, sign and symptoms, pulse oximetery and when to call 911 or MD.  Also discussed weather considerations and indoor options.  Pt voiced understanding.     Expected Outcomes Short: Use RPE daily to regulate intensity. Long: Follow program prescription in THR. Short: Continue to follow current exercise prescription. Long: Continue exercise to improve strength and stamina.  Short: add 1-2 days a week a home walking. Long: become independent with exercise routine.              Discharge Exercise Prescription (Final Exercise Prescription Changes):  Exercise Prescription Changes - 11/24/22 0900       Response to Exercise   Duration Progress to 30 minutes of  aerobic without signs/symptoms of physical distress    Intensity THRR New   98-136     Progression   Progression Continue to progress workloads to maintain intensity without signs/symptoms of physical distress.    Average METs 4.25      Resistance Training   Training Prescription Yes    Weight 5 lb    Reps 10-15      Interval Training   Interval Training No      Treadmill   MPH 2.8    Grade 1.5    Minutes 15    METs 3.72      Recumbant Bike   Level 2.5   48 watt   Watts 48    Minutes 15    METs 3.9      NuStep   Level 4    Minutes 15    METs 3.9      REL-XR   Level 7    Minutes 15    METs 6.8      Oxygen   Maintain Oxygen Saturation 88% or higher             Nutrition:  Target Goals: Understanding of nutrition guidelines, daily intake of sodium 1500mg , cholesterol 200mg , calories 30% from fat and 7% or less from saturated fats, daily to have 5 or more servings of fruits and vegetables.  Education: All About Nutrition: -Group instruction provided by verbal, written material, interactive activities, discussions, models, and posters to present general guidelines for heart healthy nutrition including fat, fiber, MyPlate, the role of sodium in heart healthy nutrition, utilization of the nutrition label, and utilization of this knowledge for meal planning. Follow up email sent as well. Written material given at graduation. Flowsheet Row Cardiac Rehab from 09/15/2022 in [facility] Cardiac and Pulmonary Rehab  Education need identified 09/15/22       Biometrics:  Pre Biometrics - 09/15/22 1719       Pre Biometrics   Height 5' 8.8" (1.748 m)    Weight 179 lb (81.2 kg)     Waist Circumference 38 inches    Hip Circumference 40.5 inches    Waist to Hip Ratio 0.94 %    BMI (Calculated) 26.57    Single Leg Stand 30 seconds              Nutrition Therapy Plan and Nutrition Goals:  Nutrition Therapy & Goals - 09/22/22 1154       Nutrition Therapy   Diet Cardiac, low sodum    Protein (specify units) 90    Fiber 30 grams    Whole Grain Foods 3 servings    Saturated Fats 15 max. grams    Fruits and Vegetables 5 servings/day    Sodium 2 grams      Personal Nutrition Goals   Nutrition Goal Drink more water, ~64oz, less sugary beverages    Personal Goal #2 Watch carb portions, pair carbs with protein at meals    Personal Goal #3 Work on building better habits with new routine    Comments Patient drinking some water, ~32oz but is mostly drinking high calorie sugary beverages. Says he drinks them because he likes them. Spoke with him about the importance of water and reducing sugar intake. He is agreeable to setting a goal of more water, less sugary beverages. He reports he doesn't have a routine, and struggles to be consistent with his eating habits. Sometimes missing meal or snacking on junk. Discussed setting goal to be more consistent. He reports he will be going back to work soon, and will have more of a routine. Reviewed mediterranean diet handout, types of fats, sources, how to read label. Brainstormed and built out several meals and snacks with foods he likes and will eat, focusing on controlled portions of carbs, paired with healthier proteins and fats.      Intervention Plan   Intervention Prescribe, educate and counsel regarding individualized specific dietary modifications aiming towards targeted core components such as weight, hypertension, lipid management, diabetes, heart failure and other comorbidities.;Nutrition handout(s) given to patient.    Expected Outcomes Short Term Goal: Understand basic principles of dietary content, such as calories, fat,  sodium, cholesterol and nutrients.;Short Term Goal: A plan has been developed with personal nutrition goals set during dietitian appointment.;Long Term Goal: Adherence to prescribed nutrition plan.             Nutrition Assessments:  Nutrition Assessments - 09/15/22 1723       Rate Your Plate Scores   Pre Score --            MEDIFICTS Score Key: >=70 Need to make dietary changes  40-70 Heart Healthy Diet <= 40 Therapeutic Level Cholesterol Diet  Flowsheet Row Cardiac Rehab from 09/15/2022 in [facility] Cardiac and Pulmonary Rehab  Picture Your Plate Total Score on Admission 54      Picture Your Plate Scores: <16 Unhealthy dietary pattern with much room for improvement. 41-50 Dietary pattern unlikely to meet recommendations for good health and room for improvement. 51-60 More healthful dietary pattern, with some room for improvement.  >60 Healthy dietary pattern, although there may be some specific behaviors that could be improved.    Nutrition Goals Re-Evaluation:  Nutrition Goals Re-Evaluation     Row Name 10/27/22 1008 11/24/22 0934           Goals   Nutrition Goal  Drink more water, ~64oz, less sugary beverages Drink more water, ~64oz, less sugary beverages      Comment Patient reports that he has cut back on sugary beverages. He usually has 1 soda a day but not he sometimes goes a day or 2 without any. This is the main nutrition goals he has been working on. He has not yet started to work on his other nutition goals. Patient reports that he is trying to drink more water      Expected Outcome Short: try to be more mindful of eating habits and pairing carbs with protein. Long: establish and maintain heart healthy eating habits. Short: continue to work towards 64 fl oz a day of water intake. Long: be more consious about nutrition choices.        Personal Goal #2 Re-Evaluation   Personal Goal #2 Watch carb portions, pair carbs with protein at meals --        Personal Goal  #3 Re-Evaluation   Personal Goal #3 Work on building better habits with new routine --               Nutrition Goals Discharge (Final Nutrition Goals Re-Evaluation):  Nutrition Goals Re-Evaluation - 11/24/22 0934       Goals   Nutrition Goal Drink more water, ~64oz, less sugary beverages    Comment Patient reports that he is trying to drink more water    Expected Outcome Short: continue to work towards 64 fl oz a day of water intake. Long: be more consious about nutrition choices.             Psychosocial: Target Goals: Acknowledge presence or absence of significant depression and/or stress, maximize coping skills, provide positive support system. Participant is able to verbalize types and ability to use techniques and skills needed for reducing stress and depression.   Education: Stress, Anxiety, and Depression - Group verbal and visual presentation to define topics covered.  Reviews how body is impacted by stress, anxiety, and depression.  Also discusses healthy ways to reduce stress and to treat/manage anxiety and depression.  Written material given at graduation.   Education: Sleep Hygiene -Provides group verbal and written instruction about how sleep can affect your health.  Define sleep hygiene, discuss sleep cycles and impact of sleep habits. Review good sleep hygiene tips.    Initial Review & Psychosocial Screening:  Initial Psych Review & Screening - 09/11/22 1344       Family Dynamics   Good Support System? Yes      Barriers   Psychosocial barriers to participate in program There are no identifiable barriers or psychosocial needs.;The patient should benefit from training in stress management and relaxation.      Screening Interventions   Interventions Encouraged to exercise;Provide feedback about the scores to participant;To provide support and resources with identified psychosocial needs    Expected Outcomes Long Term Goal: Stressors or current issues are  controlled or eliminated.;Short Term goal: Utilizing psychosocial counselor, staff and physician to assist with identification of specific Stressors or current issues interfering with healing process. Setting desired goal for each stressor or current issue identified.;Long Term goal: The participant improves quality of Life and PHQ9 Scores as seen by post scores and/or verbalization of changes;Short Term goal: Identification and review with participant of any Quality of Life or Depression concerns found by scoring the questionnaire.             Quality of Life Scores:   Quality of Life -  09/15/22 1721       Quality of Life   Select Quality of Life      Quality of Life Scores   Health/Function Pre 24.2 %    Socioeconomic Pre 26.29 %    Psych/Spiritual Pre 24.86 %    Family Pre 27.6 %    GLOBAL Pre 25.39 %            Scores of 19 and below usually indicate a poorer quality of life in these areas.  A difference of  2-3 points is a clinically meaningful difference.  A difference of 2-3 points in the total score of the Quality of Life Index has been associated with significant improvement in overall quality of life, self-image, physical symptoms, and general health in studies assessing change in quality of life.  PHQ-9: Review Flowsheet  More data exists      09/15/2022 12/03/2021 11/23/2020 11/23/2019 10/27/2018  Depression screen PHQ 2/9  Decreased Interest 0 0 0 0 0  Down, Depressed, Hopeless 0 0 0 0 0  PHQ - 2 Score 0 0 0 0 0  Altered sleeping 0 - - - -  Tired, decreased energy 0 - - - -  Change in appetite 0 - - - -  Feeling bad or failure about yourself  0 - - - -  Trouble concentrating 0 - - - -  Moving slowly or fidgety/restless 0 - - - -  Suicidal thoughts 0 - - - -  PHQ-9 Score 0 - - - -    Details           Interpretation of Total Score  Total Score Depression Severity:  1-4 = Minimal depression, 5-9 = Mild depression, 10-14 = Moderate depression, 15-19 =  Moderately severe depression, 20-27 = Severe depression   Psychosocial Evaluation and Intervention:  Psychosocial Evaluation - 09/11/22 1359       Psychosocial Evaluation & Interventions   Interventions Encouraged to exercise with the program and follow exercise prescription    Comments Willie Griffin is coming to cardiac rehab post stents. He notes that he can tell a positive difference in his stamina, but wants to improve it more. He has had an episode of chest pain which was relieved with rest. He reports having a good support system and has no stress concerns at this time. He wants to come work on his strength and gain more knowledge about living a heart healthy life.    Expected Outcomes Short: attend cardiac rehab for education and exercise. Long: develop and maintain positive self care habits.    Continue Psychosocial Services  Follow up required by staff             Psychosocial Re-Evaluation:  Psychosocial Re-Evaluation     Row Name 10/27/22 1022 11/24/22 0949           Psychosocial Re-Evaluation   Current issues with None Identified None Identified      Comments Patient reports there have not been any changes to in sleep, stress, or mental health, but that he does not sleep enough. This is his usual but he reports that he is often tired during the day and stays up to late trying to get things done. He was encouraged to try to establish a consistent bedtime routine to help aid in preparing the body for bettter sleep. no changes reported in in stress, sleep, or mental health. Continue to attend cardiac rehab and maintain good mental health habits.  Expected Outcomes Short: establish a consistent bedtime routine. Long: maintain good mental health routine. Short: establish a consistent bedtime routine. Long: maintain good mental health routine.      Interventions Encouraged to attend Cardiac Rehabilitation for the exercise Encouraged to attend Cardiac Rehabilitation for the exercise       Continue Psychosocial Services  Follow up required by staff Follow up required by staff               Psychosocial Discharge (Final Psychosocial Re-Evaluation):  Psychosocial Re-Evaluation - 11/24/22 0949       Psychosocial Re-Evaluation   Current issues with None Identified    Comments no changes reported in in stress, sleep, or mental health. Continue to attend cardiac rehab and maintain good mental health habits.    Expected Outcomes Short: establish a consistent bedtime routine. Long: maintain good mental health routine.    Interventions Encouraged to attend Cardiac Rehabilitation for the exercise    Continue Psychosocial Services  Follow up required by staff             Vocational Rehabilitation: Provide vocational rehab assistance to qualifying candidates.   Vocational Rehab Evaluation & Intervention:  Vocational Rehab - 09/11/22 1344       Initial Vocational Rehab Evaluation & Intervention   Assessment shows need for Vocational Rehabilitation No             Education: Education Goals: Education classes will be provided on a variety of topics geared toward better understanding of heart health and risk factor modification. Participant will state understanding/return demonstration of topics presented as noted by education test scores.  Learning Barriers/Preferences:  Learning Barriers/Preferences - 09/11/22 1343       Learning Barriers/Preferences   Learning Barriers None    Learning Preferences Individual Instruction             General Cardiac Education Topics:  AED/CPR: - Group verbal and written instruction with the use of models to demonstrate the basic use of the AED with the basic ABC's of resuscitation.   Anatomy and Cardiac Procedures: - Group verbal and visual presentation and models provide information about basic cardiac anatomy and function. Reviews the testing methods done to diagnose heart disease and the outcomes of the test  results. Describes the treatment choices: Medical Management, Angioplasty, or Coronary Bypass Surgery for treating various heart conditions including Myocardial Infarction, Angina, Valve Disease, and Cardiac Arrhythmias.  Written material given at graduation. Flowsheet Row Cardiac Rehab from 09/15/2022 in [facility] Cardiac and Pulmonary Rehab  Education need identified 09/15/22       Medication Safety: - Group verbal and visual instruction to review commonly prescribed medications for heart and lung disease. Reviews the medication, class of the drug, and side effects. Includes the steps to properly store meds and maintain the prescription regimen.  Written material given at graduation.   Intimacy: - Group verbal instruction through game format to discuss how heart and lung disease can affect sexual intimacy. Written material given at graduation..   Know Your Numbers and Heart Failure: - Group verbal and visual instruction to discuss disease risk factors for cardiac and pulmonary disease and treatment options.  Reviews associated critical values for Overweight/Obesity, Hypertension, Cholesterol, and Diabetes.  Discusses basics of heart failure: signs/symptoms and treatments.  Introduces Heart Failure Zone chart for action plan for heart failure.  Written material given at graduation.   Infection Prevention: - Provides verbal and written material to individual with discussion of infection control including proper  hand washing and proper equipment cleaning during exercise session. Flowsheet Row Cardiac Rehab from 09/15/2022 in [facility] Cardiac and Pulmonary Rehab  Date 09/15/22  Educator MB  Instruction Review Code 1- Verbalizes Understanding       Falls Prevention: - Provides verbal and written material to individual with discussion of falls prevention and safety. Flowsheet Row Cardiac Rehab from 09/15/2022 in [facility] Cardiac and Pulmonary Rehab  Date 09/15/22  Educator MB  Instruction Review Code  1- Verbalizes Understanding       Other: -Provides group and verbal instruction on various topics (see comments)   Knowledge Questionnaire Score:  Knowledge Questionnaire Score - 09/15/22 1724       Knowledge Questionnaire Score   Pre Score 23/26             Core Components/Risk Factors/Patient Goals at Admission:  Personal Goals and Risk Factors at Admission - 09/15/22 1725       Core Components/Risk Factors/Patient Goals on Admission    Weight Management Yes    Intervention Weight Management: Provide education and appropriate resources to help participant work on and attain dietary goals.;Weight Management: Develop a combined nutrition and exercise program designed to reach desired caloric intake, while maintaining appropriate intake of nutrient and fiber, sodium and fats, and appropriate energy expenditure required for the weight goal.    Expected Outcomes Weight Maintenance: Understanding of the daily nutrition guidelines, which includes 25-35% calories from fat, 7% or less cal from saturated fats, less than 200mg  cholesterol, less than 1.5gm of sodium, & 5 or more servings of fruits and vegetables daily;Understanding recommendations for meals to include 15-35% energy as protein, 25-35% energy from fat, 35-60% energy from carbohydrates, less than 200mg  of dietary cholesterol, 20-35 gm of total fiber daily;Understanding of distribution of calorie intake throughout the day with the consumption of 4-5 meals/snacks;Short Term: Continue to assess and modify interventions until short term weight is achieved;Long Term: Adherence to nutrition and physical activity/exercise program aimed toward attainment of established weight goal    Lipids Yes    Intervention Provide education and support for participant on nutrition & aerobic/resistive exercise along with prescribed medications to achieve LDL 70mg , HDL >40mg .    Expected Outcomes Short Term: Participant states understanding of desired  cholesterol values and is compliant with medications prescribed. Participant is following exercise prescription and nutrition guidelines.;Long Term: Cholesterol controlled with medications as prescribed, with individualized exercise RX and with personalized nutrition plan. Value goals: LDL < 70mg , HDL > 40 mg.             Education:Diabetes - Individual verbal and written instruction to review signs/symptoms of diabetes, desired ranges of glucose level fasting, after meals and with exercise. Acknowledge that pre and post exercise glucose checks will be done for 3 sessions at entry of program.   Core Components/Risk Factors/Patient Goals Review:   Goals and Risk Factor Review     Row Name 10/27/22 1013 11/24/22 0939           Core Components/Risk Factors/Patient Goals Review   Personal Goals Review Weight Management/Obesity;Lipids Lipids;Weight Management/Obesity      Review Patient reports that he takes all his meds without any probelems and follows up with MD any concerns arise. His weight has been maintained. Patient reports that he takes all meds and follows up with his doctor with appropriate blood work to manage cholesterol and lipids. His weight is fairly steady and he continues to monitor his weight.      Expected Outcomes Short:  continue to attend cardiac rehab for risk factor reduction. Continue to take all meds and manage cholesterol with guidance from MD. Long: maintain healthy weight and control cardiac risk factos. Short: continue to attend cardiac rehab classes and take meds as prescribed. Long: continue to control cardiac risk factors with healthy lifestyle choices and medication management.               Core Components/Risk Factors/Patient Goals at Discharge (Final Review):   Goals and Risk Factor Review - 11/24/22 0939       Core Components/Risk Factors/Patient Goals Review   Personal Goals Review Lipids;Weight Management/Obesity    Review Patient reports that  he takes all meds and follows up with his doctor with appropriate blood work to manage cholesterol and lipids. His weight is fairly steady and he continues to monitor his weight.    Expected Outcomes Short: continue to attend cardiac rehab classes and take meds as prescribed. Long: continue to control cardiac risk factors with healthy lifestyle choices and medication management.             ITP Comments:  ITP Comments     Row Name 09/11/22 1341 09/15/22 1734 09/22/22 1054 10/01/22 1038 10/29/22 1213   ITP Comments Initial phone call completed. Diagnosis can be found in [facility] 7/25. EP Orientation scheduled for Monday 8/19 at 2:30. Completed and gym orientation. Initial ITP created and sent for review to Dr. Bethann Punches, Medical Director. First full day of exercise!  Patient was oriented to gym and equipment including functions, settings, policies, and procedures.  Patient's individual exercise prescription and treatment plan were reviewed.  All starting workloads were established based on the results of the 6 minute walk test done at initial orientation visit.  The plan for exercise progression was also introduced and progression will be customized based on patient's performance and goals. 30 Day review completed. Medical Director ITP review done, changes made as directed, and signed approval by Medical Director.     new to program 30 Day review completed. Medical Director ITP review done, changes made as directed, and signed approval by Medical Director.    Row Name 11/26/22 1303           ITP Comments 30 Day review completed. Medical Director ITP review done, changes made as directed, and signed approval by Medical Director.                Comments:

## 2022-11-26 NOTE — Progress Notes (Signed)
Daily Session Note  Patient Details  Name: Willie Griffin MRN: [id number] Date of Birth: April 03, 1955 Referring Provider:   Flowsheet Row Cardiac Rehab from 09/15/2022 in [facility] Cardiac and Pulmonary Rehab  Referring Provider Dr.Arida       Encounter Date: 11/26/2022  Check In:  Session Check In - 11/26/22 0258       Check-In   Supervising physician immediately available to respond to emergencies See telemetry face sheet for immediately available ER MD    Location [facility]-Cardiac & Pulmonary Rehab    Staff Present Cora Collum, RN, BSN, CCRP;Joseph Hood, RCP,RRT,BSRT;Maxon Milnor BS, , Exercise Physiologist;Kenton Fortin Katrinka Blazing, RN, [place]    Virtual Visit No    Medication changes reported     No    Fall or balance concerns reported    No    Warm-up and Cool-down Performed on first and last piece of equipment    Resistance Training Performed Yes    VAD Patient? No    PAD/SET Patient? No      Pain Assessment   Currently in Pain? No/denies                Social History   Tobacco Use  Smoking Status Never   Passive exposure: Past  Smokeless Tobacco Never    Goals Met:  Independence with exercise equipment Exercise tolerated well No report of concerns or symptoms today Strength training completed today  Goals Unmet:  Not Applicable  Comments: Pt able to follow exercise prescription today without complaint.  Will continue to monitor for progression.    Dr. Bethann Punches is Medical Director for [facility] Cardiac Rehabilitation.  Dr. Vida Rigger is Medical Director for [facility] Pulmonary Rehabilitation.

## 2022-11-28 ENCOUNTER — Encounter: Payer: Medicare Other | Attending: Cardiovascular Disease | Admitting: *Deleted

## 2022-11-28 DIAGNOSIS — Z955 Presence of coronary angioplasty implant and graft: Secondary | ICD-10-CM | POA: Diagnosis present

## 2022-11-28 NOTE — Progress Notes (Signed)
Daily Session Note  Patient Details  Name: Willie Griffin MRN: [id number] Date of Birth: [birth year] Referring Provider:   Flowsheet Row Cardiac Rehab from 09/15/2022 in [facility] Cardiac and Pulmonary Rehab  Referring Provider Dr.Arida       Encounter Date: 11/28/2022  Check In:  Session Check In - 11/28/22 0928       Check-In   Supervising physician immediately available to respond to emergencies See telemetry face sheet for immediately available ER MD    Location [facility]-Cardiac & Pulmonary Rehab    Staff Present Ronette Deter, BS, Exercise Physiologist;Joseph Shelbie Proctor, RN, [place]    Virtual Visit No    Medication changes reported     No    Fall or balance concerns reported    No    Warm-up and Cool-down Performed on first and last piece of equipment    Resistance Training Performed Yes    VAD Patient? No    PAD/SET Patient? No      Pain Assessment   Currently in Pain? No/denies                Social History   Tobacco Use  Smoking Status Never   Passive exposure: Past  Smokeless Tobacco Never    Goals Met:  Independence with exercise equipment Exercise tolerated well No report of concerns or symptoms today Strength training completed today  Goals Unmet:  Not Applicable  Comments: Pt able to follow exercise prescription today without complaint.  Will continue to monitor for progression.    Dr. Bethann Punches is Medical Director for [facility] Cardiac Rehabilitation.  Dr. Vida Rigger is Medical Director for [facility] Pulmonary Rehabilitation.

## 2022-12-01 ENCOUNTER — Encounter: Payer: Medicare Other | Admitting: *Deleted

## 2022-12-01 DIAGNOSIS — Z955 Presence of coronary angioplasty implant and graft: Secondary | ICD-10-CM | POA: Diagnosis not present

## 2022-12-01 NOTE — Progress Notes (Signed)
Daily Session Note  Patient Details  Name: AIVEN KAMPE MRN: [id number] Date of Birth: [birth year] Referring Provider:   Flowsheet Row Cardiac Rehab from 09/15/2022 in [facility] Cardiac and Pulmonary Rehab  Referring Provider Dr.Arida       Encounter Date: 11/4/20242  Check In:  Session Check In - 12/01/22 0937       Check-In   Supervising physician immediately available to respond to emergencies See telemetry face sheet for immediately available ER MD    Location [facility]-Cardiac & Pulmonary Rehab    Staff Present Cora Collum, RN, BSN, CCRP;Jason Wallace Cullens, RDN, Fonda Kinder, BS, ACSM CEP, Exercise Physiologist;Maxon Conetta BS, , Exercise Physiologist    Virtual Visit No    Medication changes reported     No    Fall or balance concerns reported    No    Warm-up and Cool-down Performed on first and last piece of equipment    Resistance Training Performed Yes    VAD Patient? No    PAD/SET Patient? No      Pain Assessment   Currently in Pain? No/denies                Social History   Tobacco Use  Smoking Status Never   Passive exposure: Past  Smokeless Tobacco Never    Goals Met:  Independence with exercise equipment Exercise tolerated well No report of concerns or symptoms today  Goals Unmet:  Not Applicable  Comments: Pt able to follow exercise prescription today without complaint.  Will continue to monitor for progression.    Dr. Bethann Punches is Medical Director for [facility] Cardiac Rehabilitation.  Dr. Vida Rigger is Medical Director for [facility] Pulmonary Rehabilitation.

## 2022-12-03 ENCOUNTER — Encounter: Payer: Medicare Other | Admitting: *Deleted

## 2022-12-03 DIAGNOSIS — Z955 Presence of coronary angioplasty implant and graft: Secondary | ICD-10-CM

## 2022-12-03 NOTE — Progress Notes (Signed)
Daily Session Note  Patient Details  Name: Willie Griffin MRN: [id number] Date of Birth: 02-17-1955 Referring Provider:   Flowsheet Row Cardiac Rehab from 09/15/2022 in [facility] Cardiac and Pulmonary Rehab  Referring Provider Dr.Arida       Encounter Date: 12/03/2022  Check In:  Session Check In - 12/03/22 0940       Check-In   Supervising physician immediately available to respond to emergencies See telemetry face sheet for immediately available ER MD    Location [facility]-Cardiac & Pulmonary Rehab    Staff Present Ronette Deter, BS, Exercise Physiologist;Joseph [place], Guinevere Ferrari, RN, ADN;Maxon [employer], , Exercise Physiologist    Virtual Visit No    Medication changes reported     No    Fall or balance concerns reported    No    Warm-up and Cool-down Performed on first and last piece of equipment    Resistance Training Performed Yes    VAD Patient? No    PAD/SET Patient? No      Pain Assessment   Currently in Pain? No/denies                Social History   Tobacco Use  Smoking Status Never   Passive exposure: Past  Smokeless Tobacco Never    Goals Met:  Independence with exercise equipment Exercise tolerated well No report of concerns or symptoms today Strength training completed today  Goals Unmet:  Not Applicable  Comments: Pt able to follow exercise prescription today without complaint.  Will continue to monitor for progression.    Dr. Bethann Punches is Medical Director for [facility] Cardiac Rehabilitation.  Dr. Vida Rigger is Medical Director for [facility] Pulmonary Rehabilitation.

## 2022-12-08 ENCOUNTER — Encounter: Payer: Medicare Other | Admitting: *Deleted

## 2022-12-08 DIAGNOSIS — Z955 Presence of coronary angioplasty implant and graft: Secondary | ICD-10-CM

## 2022-12-08 NOTE — Progress Notes (Signed)
Daily Session Note  Patient Details  Name: Willie Griffin MRN: [id number] Date of Birth: 10-18-1955 Referring Provider:   Flowsheet Row Cardiac Rehab from 09/15/2022 in [facility] Cardiac and Pulmonary Rehab  Referring Provider Dr.Arida       Encounter Date: 12/08/2022  Check In:  Session Check In - 12/08/22 0926       Check-In   Supervising physician immediately available to respond to emergencies See telemetry face sheet for immediately available ER MD    Location [facility]-Cardiac & Pulmonary Rehab    Staff Present Maxon Conetta BS, , Exercise Physiologist;Kelly Madilyn Fireman, BS, ACSM CEP, Exercise Physiologist;Derian Pfost Katrinka Blazing, RN, ADN;Susanne Bice, RN, BSN, CCRP    Virtual Visit No    Medication changes reported     No    Fall or balance concerns reported    No    Warm-up and Cool-down Performed on first and last piece of equipment    Resistance Training Performed Yes    VAD Patient? No    PAD/SET Patient? No      Pain Assessment   Currently in Pain? No/denies                Social History   Tobacco Use  Smoking Status Never   Passive exposure: Past  Smokeless Tobacco Never    Goals Met:  Independence with exercise equipment Exercise tolerated well No report of concerns or symptoms today Strength training completed today  Goals Unmet:  Not Applicable  Comments: Pt able to follow exercise prescription today without complaint.  Will continue to monitor for progression.    Dr. Bethann Punches is Medical Director for [facility] Cardiac Rehabilitation.  Dr. Vida Rigger is Medical Director for [facility] Pulmonary Rehabilitation.

## 2022-12-09 ENCOUNTER — Ambulatory Visit: Payer: Medicare Other | Admitting: Internal Medicine

## 2022-12-09 ENCOUNTER — Encounter: Payer: Self-pay | Admitting: Internal Medicine

## 2022-12-09 VITALS — BP 108/70 | HR 64 | Temp 98.4°F | Ht 67.25 in | Wt 172.0 lb

## 2022-12-09 DIAGNOSIS — K219 Gastro-esophageal reflux disease without esophagitis: Secondary | ICD-10-CM

## 2022-12-09 DIAGNOSIS — Z1159 Encounter for screening for other viral diseases: Secondary | ICD-10-CM

## 2022-12-09 DIAGNOSIS — M5441 Lumbago with sciatica, right side: Secondary | ICD-10-CM | POA: Diagnosis not present

## 2022-12-09 DIAGNOSIS — I25118 Atherosclerotic heart disease of native coronary artery with other forms of angina pectoris: Secondary | ICD-10-CM

## 2022-12-09 DIAGNOSIS — Z0000 Encounter for general adult medical examination without abnormal findings: Secondary | ICD-10-CM

## 2022-12-09 DIAGNOSIS — M5442 Lumbago with sciatica, left side: Secondary | ICD-10-CM

## 2022-12-09 DIAGNOSIS — E039 Hypothyroidism, unspecified: Secondary | ICD-10-CM | POA: Diagnosis not present

## 2022-12-09 DIAGNOSIS — Z125 Encounter for screening for malignant neoplasm of prostate: Secondary | ICD-10-CM

## 2022-12-09 DIAGNOSIS — G8929 Other chronic pain: Secondary | ICD-10-CM

## 2022-12-09 DIAGNOSIS — Z23 Encounter for immunization: Secondary | ICD-10-CM | POA: Diagnosis not present

## 2022-12-09 LAB — LIPID PANEL
Cholesterol: 111 mg/dL (ref 0–200)
HDL: 39.9 mg/dL (ref >39.00)
LDL Cholesterol: 58 mg/dL (ref 0–99)
NonHDL: 70.7
Total CHOL/HDL Ratio: 3
Triglycerides: 62 mg/dL (ref 0.0–149.0)
VLDL: 12.4 mg/dL (ref 0.0–40.0)

## 2022-12-09 LAB — CBC
HCT: 41.9 % (ref 39.0–52.0)
Hemoglobin: 14.5 g/dL (ref 13.0–17.0)
MCHC: 34.6 g/dL (ref 30.0–36.0)
MCV: 92.8 fL (ref 78.0–100.0)
Platelets: 258 10*3/uL (ref 150.0–400.0)
RBC: 4.51 Mil/uL (ref 4.22–5.81)
RDW: 12.7 % (ref 11.5–15.5)
WBC: 5.6 10*3/uL (ref 4.0–10.5)

## 2022-12-09 LAB — COMPREHENSIVE METABOLIC PANEL
ALT: 17 U/L (ref 0–53)
AST: 19 U/L (ref 0–37)
Albumin: 4.2 g/dL (ref 3.5–5.2)
Alkaline Phosphatase: 69 U/L (ref 39–117)
BUN: 18 mg/dL (ref 6–23)
CO2: 30 meq/L (ref 19–32)
Calcium: 9 mg/dL (ref 8.4–10.5)
Chloride: 105 meq/L (ref 96–112)
Creatinine, Ser: 1.11 mg/dL (ref 0.40–1.50)
GFR: 69.55 mL/min (ref >60.00)
Glucose, Bld: 99 mg/dL (ref 70–99)
Potassium: 4.4 meq/L (ref 3.5–5.1)
Sodium: 141 meq/L (ref 135–145)
Total Bilirubin: 0.9 mg/dL (ref 0.2–1.2)
Total Protein: 6.2 g/dL (ref 6.0–8.3)

## 2022-12-09 LAB — T4, FREE: Free T4: 0.85 ng/dL (ref 0.60–1.60)

## 2022-12-09 LAB — PSA, MEDICARE: PSA: 2.54 ng/mL (ref 0.10–4.00)

## 2022-12-09 LAB — TSH: TSH: 1.06 u[IU]/mL (ref 0.35–5.50)

## 2022-12-09 MED ORDER — HYDROCORTISONE 2.5 % EX CREA: TOPICAL_CREAM | CUTANEOUS | 5 refills | Status: AC

## 2022-12-09 NOTE — Assessment & Plan Note (Signed)
No regular meds for this

## 2022-12-09 NOTE — Addendum Note (Signed)
Addended by: Eual Fines on: 12/09/2022 09:42 AM   Modules accepted: Orders

## 2022-12-09 NOTE — Progress Notes (Signed)
Hearing Screening  Method: Audiometry   500Hz  1000Hz  2000Hz  4000Hz   Right ear 20 20 20 20   Left ear 20 20 20 20    Vision Screening   Right eye Left eye Both eyes  Without correction 20/30 20/25 20/25   With correction

## 2022-12-09 NOTE — Assessment & Plan Note (Signed)
Quiet on pantoprazole 40 daily ?

## 2022-12-09 NOTE — Assessment & Plan Note (Signed)
Still with stable pattern of DOE DAPT with plavix/ASA for now Atorvastatin 20

## 2022-12-09 NOTE — Assessment & Plan Note (Signed)
Seems euthyroid on levothyroxine daily
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
## 2022-12-09 NOTE — Assessment & Plan Note (Signed)
I have personally reviewed the Medicare Annual Wellness questionnaire and have noted 1. The patient's medical and social history 2. Their use of alcohol, tobacco or illicit drugs 3. Their current medications and supplements 4. The patient's functional ability including ADL's, fall risks, home safety risks and hearing or visual             impairment. 5. Diet and physical activities 6. Evidence for depression or mood disorders  The patients weight, height, BMI and visual acuity have been recorded in the chart I have made referrals, counseling and provided education to the patient based review of the above and I have provided the pt with a written personalized care plan for preventive services.  I have provided you with a copy of your personalized plan for preventive services. Please take the time to review along with your updated medication list.  Discussed continuing exercise after cardiac rehab Last screening colon due 2034 Will check PSA Prevnar 20 today Prefers no COVID vaccine---and will consider the flu vaccine

## 2022-12-10 LAB — HEPATITIS C ANTIBODY: Hepatitis C Ab: NONREACTIVE

## 2022-12-11 ENCOUNTER — Encounter: Payer: Self-pay | Admitting: Cardiology

## 2022-12-11 ENCOUNTER — Encounter: Payer: Medicare Other | Admitting: *Deleted

## 2022-12-11 DIAGNOSIS — Z955 Presence of coronary angioplasty implant and graft: Secondary | ICD-10-CM

## 2022-12-11 NOTE — Progress Notes (Signed)
Daily Session Note  Patient Details  Name: LAQUINN VEASEY MRN: [id number] Date of Birth: 07-02-1955 Referring Provider:   Flowsheet Row Cardiac Rehab from 09/15/2022 in [facility] Cardiac and Pulmonary Rehab  Referring Provider Dr.Arida       Encounter Date: 12/11/2022  Check In:  Session Check In - 12/11/22 0926       Check-In   Supervising physician immediately available to respond to emergencies See telemetry face sheet for immediately available ER MD    Location [facility]-Cardiac & Pulmonary Rehab    Staff Present Cora Collum, RN, BSN, CCRP;Margaret Best, MS, Exercise Physiologist;Balen Woolum Katrinka Blazing, RN, ADN    Virtual Visit No    Medication changes reported     No    Fall or balance concerns reported    No    Warm-up and Cool-down Performed on first and last piece of equipment    Resistance Training Performed Yes    VAD Patient? No    PAD/SET Patient? No      Pain Assessment   Currently in Pain? No/denies                Social History   Tobacco Use  Smoking Status Never   Passive exposure: Past  Smokeless Tobacco Never    Goals Met:  Independence with exercise equipment Exercise tolerated well No report of concerns or symptoms today Strength training completed today  Goals Unmet:  Not Applicable  Comments: Pt able to follow exercise prescription today without complaint.  Will continue to monitor for progression.    Dr. Bethann Punches is Medical Director for [facility] Cardiac Rehabilitation.  Dr. Vida Rigger is Medical Director for [facility] Pulmonary Rehabilitation.

## 2022-12-15 ENCOUNTER — Encounter: Payer: Medicare Other | Admitting: *Deleted

## 2022-12-15 DIAGNOSIS — Z955 Presence of coronary angioplasty implant and graft: Secondary | ICD-10-CM

## 2022-12-15 NOTE — Progress Notes (Signed)
Daily Session Note  Patient Details  Name: Willie Griffin MRN: [id number] Date of Birth: [birth year] Referring Provider:   Flowsheet Row Cardiac Rehab from 09/15/2022 in [facility] Cardiac and Pulmonary Rehab  Referring Provider Dr.Arida       Encounter Date: 12/15/2022  Check In:  Session Check In - 12/15/22 0935       Check-In   Supervising physician immediately available to respond to emergencies See telemetry face sheet for immediately available ER MD    Location [facility]-Cardiac & Pulmonary Rehab    Staff Present Rory Percy, MS, Exercise Physiologist;Krista Karleen Hampshire RN, Mabeline Caras, BS, ACSM CEP, Exercise Physiologist;Kippy Gohman Katrinka Blazing, RN, ADN    Virtual Visit No    Medication changes reported     No    Fall or balance concerns reported    No    Warm-up and Cool-down Performed on first and last piece of equipment    Resistance Training Performed Yes    VAD Patient? No    PAD/SET Patient? No      Pain Assessment   Currently in Pain? No/denies                Social History   Tobacco Use  Smoking Status Never   Passive exposure: Past  Smokeless Tobacco Never    Goals Met:  Independence with exercise equipment Exercise tolerated well Personal goals reviewed No report of concerns or symptoms today Strength training completed today  Goals Unmet:  Not Applicable  Comments: Pt able to follow exercise prescription today without complaint.  Will continue to monitor for progression.    Dr. Bethann Punches is Medical Director for [facility] Cardiac Rehabilitation.  Dr. Vida Rigger is Medical Director for [facility] Pulmonary Rehabilitation.

## 2022-12-17 ENCOUNTER — Encounter: Payer: Self-pay | Admitting: *Deleted

## 2022-12-17 DIAGNOSIS — Z955 Presence of coronary angioplasty implant and graft: Secondary | ICD-10-CM

## 2022-12-17 NOTE — Progress Notes (Signed)
Cardiac Individual Treatment Plan  Patient Details  Name: Willie Griffin MRN: [id number] Date of Birth: 14-May-1955 Referring Provider:   Flowsheet Row Cardiac Rehab from 09/15/2022 in [facility] Cardiac and Pulmonary Rehab  Referring Provider Dr.Arida       Initial Encounter Date:  Flowsheet Row Cardiac Rehab from 09/15/2022 in [facility] Cardiac and Pulmonary Rehab  Date 09/15/22       Visit Diagnosis: Status post coronary artery stent placement  Patient's Home Medications on Admission:  Current Outpatient Medications:    aspirin EC 81 MG tablet, Take 1 tablet (81 mg total) by mouth daily. Swallow whole., Disp: 90 tablet, Rfl: 3   atorvastatin (LIPITOR) 20 MG tablet, Take 1 tablet (20 mg total) by mouth daily., Disp: 90 tablet, Rfl: 3   clopidogrel (PLAVIX) 75 MG tablet, Take 1 tablet (75 mg total) by mouth daily with breakfast., Disp: 30 tablet, Rfl: 11   Cyanocobalamin (VITAMIN B-12 PO), Take 2 Doses by mouth daily., Disp: , Rfl:    EPIPEN 2-PAK 0.3 MG/0.3ML SOAJ injection, INJECT 0.3 MG INTRAMUSCULARLY ONCE AS NEEDED FOR UP TO 1 DOSE, Disp: 2 each, Rfl: 1   hydrocortisone 2.5 % cream, Use rectally twice daily as neeed, Disp: 30 g, Rfl: 5   levothyroxine (SYNTHROID) 25 MCG tablet, TAKE 1 TABLET BY MOUTH EVERY DAY BEFORE BREAKFAST, Disp: 90 tablet, Rfl: 3   OVER THE COUNTER MEDICATION, Take 2 capsules by mouth daily. Synoviox includes MSM, turmeric, and hyaluronic acid, Disp: , Rfl:    pantoprazole (PROTONIX) 40 MG tablet, Take 1 tablet (40 mg total) by mouth daily., Disp: 30 tablet, Rfl: 5   SOOLANTRA 1 % CREA, Apply topically daily. to face, Disp: , Rfl: 5   tadalafil (CIALIS) 20 MG tablet, Take 0.5-1 tablets (10-20 mg total) by mouth every other day as needed for erectile dysfunction., Disp: 10 tablet, Rfl: 11  Past Medical History: Past Medical History:  Diagnosis Date   Allergy    allergic rhinitis   GERD (gastroesophageal reflux disease)    Hemorrhoid    Hypothyroidism     Peyronie disease     Tobacco Use: Social History   Tobacco Use  Smoking Status Never   Passive exposure: Past  Smokeless Tobacco Never    Labs: Review Flowsheet  More data exists      Latest Ref Rng & Units 09/12/2020 11/23/2020 04/08/2021 12/03/2021 12/09/2022  Labs for ITP Cardiac and Pulmonary Rehab  Cholestrol 0 - 200 mg/dL 244  010  272  536  644   LDL (calc) 0 - 99 mg/dL 034  87  94  99  58   HDL-C >39.00 mg/dL 37  74.25  38  95.63  87.56   Trlycerides 0.0 - 149.0 mg/dL 96  43.3  95  29.5  18.8     Details             Exercise Target Goals: Exercise Program Goal: Individual exercise prescription set using results from initial 6 min walk test and THRR while considering  patient's activity barriers and safety.   Exercise Prescription Goal: Initial exercise prescription builds to 30-45 minutes a day of aerobic activity, 2-3 days per week.  Home exercise guidelines will be given to patient during program as part of exercise prescription that the participant will acknowledge.   Education: Aerobic Exercise: - Group verbal and visual presentation on the components of exercise prescription. Introduces F.I.T.T principle from ACSM for exercise prescriptions.  Reviews F.I.T.T. principles of aerobic exercise including progression.  Written material given at graduation.   Education: Resistance Exercise: - Group verbal and visual presentation on the components of exercise prescription. Introduces F.I.T.T principle from ACSM for exercise prescriptions  Reviews F.I.T.T. principles of resistance exercise including progression. Written material given at graduation.    Education: Exercise & Equipment Safety: - Individual verbal instruction and demonstration of equipment use and safety with use of the equipment. Flowsheet Row Cardiac Rehab from 09/15/2022 in [facility] Cardiac and Pulmonary Rehab  Date 09/15/22  Educator MB  Instruction Review Code 1- Verbalizes Understanding        Education: Exercise Physiology & General Exercise Guidelines: - Group verbal and written instruction with models to review the exercise physiology of the cardiovascular system and associated critical values. Provides general exercise guidelines with specific guidelines to those with heart or lung disease.  Flowsheet Row Cardiac Rehab from 09/15/2022 in [facility] Cardiac and Pulmonary Rehab  Education need identified 09/15/22       Education: Flexibility, Balance, Mind/Body Relaxation: - Group verbal and visual presentation with interactive activity on the components of exercise prescription. Introduces F.I.T.T principle from ACSM for exercise prescriptions. Reviews F.I.T.T. principles of flexibility and balance exercise training including progression. Also discusses the mind body connection.  Reviews various relaxation techniques to help reduce and manage stress (i.e. Deep breathing, progressive muscle relaxation, and visualization). Balance handout provided to take home. Written material given at graduation.   Activity Barriers & Risk Stratification:  Activity Barriers & Cardiac Risk Stratification - 09/15/22 1713       Activity Barriers & Cardiac Risk Stratification   Activity Barriers Back Problems;Joint Problems    Cardiac Risk Stratification Moderate             6 Minute Walk:  6 Minute Walk     Row Name 09/15/22 1711         6 Minute Walk   Phase Initial     Distance 1440 feet     Walk Time 6 minutes     MPH 2.72     METS 3.59     RPE 12     Perceived Dyspnea  0     VO2 Peak 12.56     Symptoms Yes (comment)     Comments Knee pain and Left hip pain     Resting HR 60 bpm     Resting BP 120/60     Resting Oxygen Saturation  97 %     Exercise Oxygen Saturation  during 6 min walk 67 %     Max Ex. HR 102 bpm     Max Ex. BP 140/72     2 Minute Post BP 126/66              Oxygen Initial Assessment:   Oxygen Re-Evaluation:   Oxygen Discharge (Final Oxygen  Re-Evaluation):   Initial Exercise Prescription:  Initial Exercise Prescription - 11/20/22 1000       Treadmill   METs 3.72      Recumbant Bike   Watts 48    METs 3.9      NuStep   METs 3.9      REL-XR   Level 7    Minutes 15    METs 6.8             Perform Capillary Blood Glucose checks as needed.  Exercise Prescription Changes:   Exercise Prescription Changes     Row Name 09/15/22 1700 11/20/22 1000 11/24/22 0900 12/04/22 1400  Response to Exercise   Blood Pressure (Admit) 120/60 106/68 -- 102/58    Blood Pressure (Exercise) 140/72 -- -- 166/58    Blood Pressure (Exit) 126/66 108/58 -- 120/56    Heart Rate (Admit) 60 bpm 69 bpm -- 82 bpm    Heart Rate (Exercise) 102 bpm 122 bpm -- 112 bpm    Heart Rate (Exit) 68 bpm 82 bpm -- 85 bpm    Oxygen Saturation (Admit) 97 % -- -- 97 %    Oxygen Saturation (Exercise) 97 % -- -- 95 %    Oxygen Saturation (Exit) 97 % -- -- 97 %    Rating of Perceived Exertion (Exercise) 12 14 -- 15    Perceived Dyspnea (Exercise) 0 0 -- 0    Symptoms knee pain and left hip pain -- -- none    Comments results -- -- --    Duration Progress to 30 minutes of  aerobic without signs/symptoms of physical distress Progress to 30 minutes of  aerobic without signs/symptoms of physical distress Progress to 30 minutes of  aerobic without signs/symptoms of physical distress Progress to 30 minutes of  aerobic without signs/symptoms of physical distress    Intensity -- THRR unchanged THRR New  98-136 THRR unchanged  98-136      Progression   Progression -- Continue to progress workloads to maintain intensity without signs/symptoms of physical distress. Continue to progress workloads to maintain intensity without signs/symptoms of physical distress. Continue to progress workloads to maintain intensity without signs/symptoms of physical distress.    Average METs -- 4.25 4.25 4.25      Resistance Training   Training Prescription -- Yes Yes  Yes    Weight -- 5 lb 5 lb 5 lb    Reps -- 10-15 10-15 10-15      Interval Training   Interval Training -- No No No      Treadmill   MPH -- 2.8 2.8 3    Grade -- 1.5 1.5 3    Minutes -- 15 15 15     METs -- -- 3.72 4.54      Recumbant Bike   Level -- 2.5  48 watt 2.5  48 watt 4    Watts -- -- 48 39    Minutes -- 15 15 15     METs -- -- 3.9 3.55      NuStep   Level -- 4 4 6     Minutes -- 15 15 15     METs -- -- 3.9 --      REL-XR   Level -- -- 7 8    Minutes -- -- 15 15    METs -- -- 6.8 7.6      T5 Nustep   Level -- -- -- 3    Minutes -- -- -- 15    METs -- -- -- 4.6      Oxygen   Maintain Oxygen Saturation -- 88% or higher 88% or higher 88% or higher             Exercise Comments:   Exercise Comments     Row Name 09/22/22 1054           Exercise Comments First full day of exercise!  Patient was oriented to gym and equipment including functions, settings, policies, and procedures.  Patient's individual exercise prescription and treatment plan were reviewed.  All starting workloads were established based on the results of the 6 minute walk test done at initial orientation  visit.  The plan for exercise progression was also introduced and progression will be customized based on patient's performance and goals.                Exercise Goals and Review:   Exercise Goals     Row Name 09/15/22 1719             Exercise Goals   Increase Physical Activity Yes       Intervention Provide advice, education, support and counseling about physical activity/exercise needs.;Develop an individualized exercise prescription for aerobic and resistive training based on initial evaluation findings, risk stratification, comorbidities and participant's personal goals.       Expected Outcomes Short Term: Attend rehab on a regular basis to increase amount of physical activity.;Long Term: Exercising regularly at least 3-5 days a week.;Long Term: Add in home exercise to make  exercise part of routine and to increase amount of physical activity.       Increase Strength and Stamina Yes       Intervention Provide advice, education, support and counseling about physical activity/exercise needs.;Develop an individualized exercise prescription for aerobic and resistive training based on initial evaluation findings, risk stratification, comorbidities and participant's personal goals.       Expected Outcomes Short Term: Increase workloads from initial exercise prescription for resistance, speed, and METs.;Short Term: Perform resistance training exercises routinely during rehab and add in resistance training at home;Long Term: Improve cardiorespiratory fitness, muscular endurance and strength as measured by increased METs and functional capacity ( )       Able to understand and use rate of perceived exertion (RPE) scale Yes       Intervention Provide education and explanation on how to use RPE scale       Expected Outcomes Short Term: Able to use RPE daily in rehab to express subjective intensity level;Long Term:  Able to use RPE to guide intensity level when exercising independently       Able to understand and use Dyspnea scale Yes       Intervention Provide education and explanation on how to use Dyspnea scale       Expected Outcomes Short Term: Able to use Dyspnea scale daily in rehab to express subjective sense of shortness of breath during exertion;Long Term: Able to use Dyspnea scale to guide intensity level when exercising independently       Knowledge and understanding of Target Heart Rate Range (THRR) Yes       Intervention Provide education and explanation of THRR including how the numbers were predicted and where they are located for reference       Expected Outcomes Short Term: Able to state/look up THRR;Long Term: Able to use THRR to govern intensity when exercising independently;Short Term: Able to use daily as guideline for intensity in rehab       Able to check  pulse independently Yes       Intervention Provide education and demonstration on how to check pulse in carotid and radial arteries.;Review the importance of being able to check your own pulse for safety during independent exercise       Expected Outcomes Short Term: Able to explain why pulse checking is important during independent exercise;Long Term: Able to check pulse independently and accurately       Understanding of Exercise Prescription Yes       Intervention Provide education, explanation, and written materials on patient's individual exercise prescription       Expected Outcomes Short Term:  Able to explain program exercise prescription;Long Term: Able to explain home exercise prescription to exercise independently                Exercise Goals Re-Evaluation :  Exercise Goals Re-Evaluation     Row Name 09/22/22 1054 11/20/22 1049 11/24/22 0946 12/04/22 1415 12/15/22 0944     Exercise Goal Re-Evaluation   Exercise Goals Review Able to understand and use rate of perceived exertion (RPE) scale;Able to understand and use Dyspnea scale;Knowledge and understanding of Target Heart Rate Range (THRR);Understanding of Exercise Prescription Increase Physical Activity;Increase Strength and Stamina;Understanding of Exercise Prescription Increase Strength and Stamina;Increase Physical Activity;Able to understand and use rate of perceived exertion (RPE) scale;Able to understand and use Dyspnea scale;Knowledge and understanding of Target Heart Rate Range (THRR);Able to check pulse independently;Understanding of Exercise Prescription Increase Physical Activity;Increase Strength and Stamina;Understanding of Exercise Prescription Increase Physical Activity;Increase Strength and Stamina;Understanding of Exercise Prescription   Comments Reviewed RPE  and dyspnea scale, THR and program prescription with pt today.  Pt voiced understanding and was given a copy of goals to take home. Leaf continues to do well in  rehab. He has been able to maintain his intensity on the XR as well as the Treadmill. We will continue to monitor his progress in the program. Reviewed home exercise with pt today.  Pt plans to walk at home for exercise.  Reviewed THR, pulse, RPE, sign and symptoms, pulse oximetery and when to call 911 or MD.  Also discussed weather considerations and indoor options.  Pt voiced understanding. Hideo continues to make improvements in rehab. He has been able to increase his level on the T4 nustep from level 4 to level 6. He was also able to increase his incline on the treadmill up to 3%. We will continue to monitor his progress in the program. Patient reports that he is not currently exercising at home. He was encouraged to add 1-2 days a week of walking at home on off days of rehab to aim toward exercising 4-5 days a week. High intensity interval training was also discussed with patient to start incorporating into his workouts in rehab classes as he feels he has made some progress but in plateuing a bit. He will start with 30 second high intensity intervals every 2-3 min. on the XR and TM.   Expected Outcomes Short: Use RPE daily to regulate intensity. Long: Follow program prescription in THR. Short: Continue to follow current exercise prescription. Long: Continue exercise to improve strength and stamina. Short: add 1-2 days a week a home walking. Long: become independent with exercise routine. Short: Continue to increase treadmill workloads. Long: Continue to exercise to improve strength and stamina. Short: add 1-2 days a week of exerics at home and start interval training in cardiac rehab class. Long: continue with independent exercise program.            Discharge Exercise Prescription (Final Exercise Prescription Changes):  Exercise Prescription Changes - 12/04/22 1400       Response to Exercise   Blood Pressure (Admit) 102/58    Blood Pressure (Exercise) 166/58    Blood Pressure (Exit) 120/56     Heart Rate (Admit) 82 bpm    Heart Rate (Exercise) 112 bpm    Heart Rate (Exit) 85 bpm    Oxygen Saturation (Admit) 97 %    Oxygen Saturation (Exercise) 95 %    Oxygen Saturation (Exit) 97 %    Rating of Perceived Exertion (Exercise) 15  Perceived Dyspnea (Exercise) 0    Symptoms none    Duration Progress to 30 minutes of  aerobic without signs/symptoms of physical distress    Intensity THRR unchanged   98-136     Progression   Progression Continue to progress workloads to maintain intensity without signs/symptoms of physical distress.    Average METs 4.25      Resistance Training   Training Prescription Yes    Weight 5 lb    Reps 10-15      Interval Training   Interval Training No      Treadmill   MPH 3    Grade 3    Minutes 15    METs 4.54      Recumbant Bike   Level 4    Watts 39    Minutes 15    METs 3.55      NuStep   Level 6    Minutes 15      REL-XR   Level 8    Minutes 15    METs 7.6      T5 Nustep   Level 3    Minutes 15    METs 4.6      Oxygen   Maintain Oxygen Saturation 88% or higher             Nutrition:  Target Goals: Understanding of nutrition guidelines, daily intake of sodium 1500mg , cholesterol 200mg , calories 30% from fat and 7% or less from saturated fats, daily to have 5 or more servings of fruits and vegetables.  Education: All About Nutrition: -Group instruction provided by verbal, written material, interactive activities, discussions, models, and posters to present general guidelines for heart healthy nutrition including fat, fiber, MyPlate, the role of sodium in heart healthy nutrition, utilization of the nutrition label, and utilization of this knowledge for meal planning. Follow up email sent as well. Written material given at graduation. Flowsheet Row Cardiac Rehab from 09/15/2022 in [facility] Cardiac and Pulmonary Rehab  Education need identified 09/15/22       Biometrics:  Pre Biometrics - 09/15/22 1719       Pre  Biometrics   Height 5' 8.8" (1.748 m)    Weight 179 lb (81.2 kg)    Waist Circumference 38 inches    Hip Circumference 40.5 inches    Waist to Hip Ratio 0.94 %    BMI (Calculated) 26.57    Single Leg Stand 30 seconds              Nutrition Therapy Plan and Nutrition Goals:  Nutrition Therapy & Goals - 09/22/22 1154       Nutrition Therapy   Diet Cardiac, low sodum    Protein (specify units) 90    Fiber 30 grams    Whole Grain Foods 3 servings    Saturated Fats 15 max. grams    Fruits and Vegetables 5 servings/day    Sodium 2 grams      Personal Nutrition Goals   Nutrition Goal Drink more water, ~64oz, less sugary beverages    Personal Goal #2 Watch carb portions, pair carbs with protein at meals    Personal Goal #3 Work on building better habits with new routine    Comments Patient drinking some water, ~32oz but is mostly drinking high calorie sugary beverages. Says he drinks them because he likes them. Spoke with him about the importance of water and reducing sugar intake. He is agreeable to setting a goal of more water, less sugary beverages.  He reports he doesn't have a routine, and struggles to be consistent with his eating habits. Sometimes missing meal or snacking on junk. Discussed setting goal to be more consistent. He reports he will be going back to work soon, and will have more of a routine. Reviewed mediterranean diet handout, types of fats, sources, how to read label. Brainstormed and built out several meals and snacks with foods he likes and will eat, focusing on controlled portions of carbs, paired with healthier proteins and fats.      Intervention Plan   Intervention Prescribe, educate and counsel regarding individualized specific dietary modifications aiming towards targeted core components such as weight, hypertension, lipid management, diabetes, heart failure and other comorbidities.;Nutrition handout(s) given to patient.    Expected Outcomes Short Term Goal:  Understand basic principles of dietary content, such as calories, fat, sodium, cholesterol and nutrients.;Short Term Goal: A plan has been developed with personal nutrition goals set during dietitian appointment.;Long Term Goal: Adherence to prescribed nutrition plan.             Nutrition Assessments:  Nutrition Assessments - 09/15/22 1723       Rate Your Plate Scores   Pre Score --            MEDIFICTS Score Key: >=70 Need to make dietary changes  40-70 Heart Healthy Diet <= 40 Therapeutic Level Cholesterol Diet  Flowsheet Row Cardiac Rehab from 09/15/2022 in [facility] Cardiac and Pulmonary Rehab  Picture Your Plate Total Score on Admission 54      Picture Your Plate Scores: <25 Unhealthy dietary pattern with much room for improvement. 41-50 Dietary pattern unlikely to meet recommendations for good health and room for improvement. 51-60 More healthful dietary pattern, with some room for improvement.  >60 Healthy dietary pattern, although there may be some specific behaviors that could be improved.    Nutrition Goals Re-Evaluation:  Nutrition Goals Re-Evaluation     Row Name 10/27/22 1008 11/24/22 0934 12/15/22 0948         Goals   Nutrition Goal Drink more water, ~64oz, less sugary beverages Drink more water, ~64oz, less sugary beverages Drink more water, ~64oz, less sugary beverages     Comment Patient reports that he has cut back on sugary beverages. He usually has 1 soda a day but not he sometimes goes a day or 2 without any. This is the main nutrition goals he has been working on. He has not yet started to work on his other nutition goals. Patient reports that he is trying to drink more water Patient reports that he has reduced soda to one every few days when he used to drink one soda every day.     Expected Outcome Short: try to be more mindful of eating habits and pairing carbs with protein. Long: establish and maintain heart healthy eating habits. Short: continue to  work towards 64 fl oz a day of water intake. Long: be more consious about nutrition choices. Short: continue to work towards eliminating sodas and drinking more water in place of it. Long: maintain heart healthy diet.       Personal Goal #2 Re-Evaluation   Personal Goal #2 Watch carb portions, pair carbs with protein at meals -- --       Personal Goal #3 Re-Evaluation   Personal Goal #3 Work on building better habits with new routine -- --              Nutrition Goals Discharge (Final Nutrition Goals Re-Evaluation):  Nutrition Goals Re-Evaluation - 12/15/22 0948       Goals   Nutrition Goal Drink more water, ~64oz, less sugary beverages    Comment Patient reports that he has reduced soda to one every few days when he used to drink one soda every day.    Expected Outcome Short: continue to work towards eliminating sodas and drinking more water in place of it. Long: maintain heart healthy diet.             Psychosocial: Target Goals: Acknowledge presence or absence of significant depression and/or stress, maximize coping skills, provide positive support system. Participant is able to verbalize types and ability to use techniques and skills needed for reducing stress and depression.   Education: Stress, Anxiety, and Depression - Group verbal and visual presentation to define topics covered.  Reviews how body is impacted by stress, anxiety, and depression.  Also discusses healthy ways to reduce stress and to treat/manage anxiety and depression.  Written material given at graduation.   Education: Sleep Hygiene -Provides group verbal and written instruction about how sleep can affect your health.  Define sleep hygiene, discuss sleep cycles and impact of sleep habits. Review good sleep hygiene tips.    Initial Review & Psychosocial Screening:  Initial Psych Review & Screening - 09/11/22 1344       Family Dynamics   Good Support System? Yes      Barriers   Psychosocial  barriers to participate in program There are no identifiable barriers or psychosocial needs.;The patient should benefit from training in stress management and relaxation.      Screening Interventions   Interventions Encouraged to exercise;Provide feedback about the scores to participant;To provide support and resources with identified psychosocial needs    Expected Outcomes Long Term Goal: Stressors or current issues are controlled or eliminated.;Short Term goal: Utilizing psychosocial counselor, staff and physician to assist with identification of specific Stressors or current issues interfering with healing process. Setting desired goal for each stressor or current issue identified.;Long Term goal: The participant improves quality of Life and PHQ9 Scores as seen by post scores and/or verbalization of changes;Short Term goal: Identification and review with participant of any Quality of Life or Depression concerns found by scoring the questionnaire.             Quality of Life Scores:   Quality of Life - 09/15/22 1721       Quality of Life   Select Quality of Life      Quality of Life Scores   Health/Function Pre 24.2 %    Socioeconomic Pre 26.29 %    Psych/Spiritual Pre 24.86 %    Family Pre 27.6 %    GLOBAL Pre 25.39 %            Scores of 19 and below usually indicate a poorer quality of life in these areas.  A difference of  2-3 points is a clinically meaningful difference.  A difference of 2-3 points in the total score of the Quality of Life Index has been associated with significant improvement in overall quality of life, self-image, physical symptoms, and general health in studies assessing change in quality of life.  PHQ-9: Review Flowsheet  More data exists      12/09/2022 09/15/2022 12/03/2021 11/23/2020 11/23/2019  Depression screen PHQ 2/9  Decreased Interest 0 0 0 0 0 0  Down, Depressed, Hopeless 0 0 0 0 0 0  PHQ - 2 Score 0 0 0 0 0 0  Altered sleeping - 0 - - -   Tired, decreased energy - 0 - - -  Change in appetite - 0 - - -  Feeling bad or failure about yourself  - 0 - - -  Trouble concentrating - 0 - - -  Moving slowly or fidgety/restless - 0 - - -  Suicidal thoughts - 0 - - -  PHQ-9 Score - 0 - - -    Details       Multiple values from one day are sorted in reverse-chronological order        Interpretation of Total Score  Total Score Depression Severity:  1-4 = Minimal depression, 5-9 = Mild depression, 10-14 = Moderate depression, 15-19 = Moderately severe depression, 20-27 = Severe depression   Psychosocial Evaluation and Intervention:  Psychosocial Evaluation - 09/11/22 1359       Psychosocial Evaluation & Interventions   Interventions Encouraged to exercise with the program and follow exercise prescription    Comments Aarnav is coming to cardiac rehab post stents. He notes that he can tell a positive difference in his stamina, but wants to improve it more. He has had an episode of chest pain which was relieved with rest. He reports having a good support system and has no stress concerns at this time. He wants to come work on his strength and gain more knowledge about living a heart healthy life.    Expected Outcomes Short: attend cardiac rehab for education and exercise. Long: develop and maintain positive self care habits.    Continue Psychosocial Services  Follow up required by staff             Psychosocial Re-Evaluation:  Psychosocial Re-Evaluation     Row Name 10/27/22 1022 11/24/22 0949 12/15/22 0951         Psychosocial Re-Evaluation   Current issues with None Identified None Identified None Identified     Comments Patient reports there have not been any changes to in sleep, stress, or mental health, but that he does not sleep enough. This is his usual but he reports that he is often tired during the day and stays up to late trying to get things done. He was encouraged to try to establish a consistent bedtime  routine to help aid in preparing the body for bettter sleep. no changes reported in in stress, sleep, or mental health. Continue to attend cardiac rehab and maintain good mental health habits. no changes reported in in stress, sleep, or mental health. He does report that some nights he only gets 4 hours of sleep. He always wakes up at the same time so he nees to work on getting to bed earlier if to get more sleep. Continue to attend cardiac rehab and maintain good mental health habits.     Expected Outcomes Short: establish a consistent bedtime routine. Long: maintain good mental health routine. Short: establish a consistent bedtime routine. Long: maintain good mental health routine. Short: establish a consistent bedtime routine. Long: maintain good mental health routine.     Interventions Encouraged to attend Cardiac Rehabilitation for the exercise Encouraged to attend Cardiac Rehabilitation for the exercise Encouraged to attend Cardiac Rehabilitation for the exercise     Continue Psychosocial Services  Follow up required by staff Follow up required by staff Follow up required by staff              Psychosocial Discharge (Final Psychosocial Re-Evaluation):  Psychosocial Re-Evaluation - 12/15/22 4742  Psychosocial Re-Evaluation   Current issues with None Identified    Comments no changes reported in in stress, sleep, or mental health. He does report that some nights he only gets 4 hours of sleep. He always wakes up at the same time so he nees to work on getting to bed earlier if to get more sleep. Continue to attend cardiac rehab and maintain good mental health habits.    Expected Outcomes Short: establish a consistent bedtime routine. Long: maintain good mental health routine.    Interventions Encouraged to attend Cardiac Rehabilitation for the exercise    Continue Psychosocial Services  Follow up required by staff             Vocational Rehabilitation: Provide vocational rehab  assistance to qualifying candidates.   Vocational Rehab Evaluation & Intervention:  Vocational Rehab - 09/11/22 1344       Initial Vocational Rehab Evaluation & Intervention   Assessment shows need for Vocational Rehabilitation No             Education: Education Goals: Education classes will be provided on a variety of topics geared toward better understanding of heart health and risk factor modification. Participant will state understanding/return demonstration of topics presented as noted by education test scores.  Learning Barriers/Preferences:  Learning Barriers/Preferences - 09/11/22 1343       Learning Barriers/Preferences   Learning Barriers None    Learning Preferences Individual Instruction             General Cardiac Education Topics:  AED/CPR: - Group verbal and written instruction with the use of models to demonstrate the basic use of the AED with the basic ABC's of resuscitation.   Anatomy and Cardiac Procedures: - Group verbal and visual presentation and models provide information about basic cardiac anatomy and function. Reviews the testing methods done to diagnose heart disease and the outcomes of the test results. Describes the treatment choices: Medical Management, Angioplasty, or Coronary Bypass Surgery for treating various heart conditions including Myocardial Infarction, Angina, Valve Disease, and Cardiac Arrhythmias.  Written material given at graduation. Flowsheet Row Cardiac Rehab from 09/15/2022 in [facility] Cardiac and Pulmonary Rehab  Education need identified 09/15/22       Medication Safety: - Group verbal and visual instruction to review commonly prescribed medications for heart and lung disease. Reviews the medication, class of the drug, and side effects. Includes the steps to properly store meds and maintain the prescription regimen.  Written material given at graduation.   Intimacy: - Group verbal instruction through game format to  discuss how heart and lung disease can affect sexual intimacy. Written material given at graduation..   Know Your Numbers and Heart Failure: - Group verbal and visual instruction to discuss disease risk factors for cardiac and pulmonary disease and treatment options.  Reviews associated critical values for Overweight/Obesity, Hypertension, Cholesterol, and Diabetes.  Discusses basics of heart failure: signs/symptoms and treatments.  Introduces Heart Failure Zone chart for action plan for heart failure.  Written material given at graduation.   Infection Prevention: - Provides verbal and written material to individual with discussion of infection control including proper hand washing and proper equipment cleaning during exercise session. Flowsheet Row Cardiac Rehab from 09/15/2022 in [facility] Cardiac and Pulmonary Rehab  Date 09/15/22  Educator MB  Instruction Review Code 1- Verbalizes Understanding       Falls Prevention: - Provides verbal and written material to individual with discussion of falls prevention and safety. Flowsheet Row Cardiac Rehab from  09/15/2022 in [facility] Cardiac and Pulmonary Rehab  Date 09/15/22  Educator MB  Instruction Review Code 1- Verbalizes Understanding       Other: -Provides group and verbal instruction on various topics (see comments)   Knowledge Questionnaire Score:  Knowledge Questionnaire Score - 09/15/22 1724       Knowledge Questionnaire Score   Pre Score 23/26             Core Components/Risk Factors/Patient Goals at Admission:  Personal Goals and Risk Factors at Admission - 09/15/22 1725       Core Components/Risk Factors/Patient Goals on Admission    Weight Management Yes    Intervention Weight Management: Provide education and appropriate resources to help participant work on and attain dietary goals.;Weight Management: Develop a combined nutrition and exercise program designed to reach desired caloric intake, while maintaining  appropriate intake of nutrient and fiber, sodium and fats, and appropriate energy expenditure required for the weight goal.    Expected Outcomes Weight Maintenance: Understanding of the daily nutrition guidelines, which includes 25-35% calories from fat, 7% or less cal from saturated fats, less than 200mg  cholesterol, less than 1.5gm of sodium, & 5 or more servings of fruits and vegetables daily;Understanding recommendations for meals to include 15-35% energy as protein, 25-35% energy from fat, 35-60% energy from carbohydrates, less than 200mg  of dietary cholesterol, 20-35 gm of total fiber daily;Understanding of distribution of calorie intake throughout the day with the consumption of 4-5 meals/snacks;Short Term: Continue to assess and modify interventions until short term weight is achieved;Long Term: Adherence to nutrition and physical activity/exercise program aimed toward attainment of established weight goal    Lipids Yes    Intervention Provide education and support for participant on nutrition & aerobic/resistive exercise along with prescribed medications to achieve LDL 70mg , HDL >40mg .    Expected Outcomes Short Term: Participant states understanding of desired cholesterol values and is compliant with medications prescribed. Participant is following exercise prescription and nutrition guidelines.;Long Term: Cholesterol controlled with medications as prescribed, with individualized exercise RX and with personalized nutrition plan. Value goals: LDL < 70mg , HDL > 40 mg.             Education:Diabetes - Individual verbal and written instruction to review signs/symptoms of diabetes, desired ranges of glucose level fasting, after meals and with exercise. Acknowledge that pre and post exercise glucose checks will be done for 3 sessions at entry of program.   Core Components/Risk Factors/Patient Goals Review:   Goals and Risk Factor Review     Row Name 10/27/22 1013 11/24/22 0939 12/15/22 0949          Core Components/Risk Factors/Patient Goals Review   Personal Goals Review Weight Management/Obesity;Lipids Lipids;Weight Management/Obesity Lipids;Weight Management/Obesity     Review Patient reports that he takes all his meds without any probelems and follows up with MD any concerns arise. His weight has been maintained. Patient reports that he takes all meds and follows up with his doctor with appropriate blood work to manage cholesterol and lipids. His weight is fairly steady and he continues to monitor his weight. Patient reports his weight has been steady. He also continues to take all cholesterol meds as prescirbed. He just recently went to his doctor for lab work and all his levels were within acceptable ranges. He will continue to follow up with his doctor to monitor his cholesterol and lipid levels.     Expected Outcomes Short: continue to attend cardiac rehab for risk factor reduction. Continue to  take all meds and manage cholesterol with guidance from MD. Long: maintain healthy weight and control cardiac risk factos. Short: continue to attend cardiac rehab classes and take meds as prescribed. Long: continue to control cardiac risk factors with healthy lifestyle choices and medication management. Short: continue to attend cardiac rehab classes and take meds as prescribed. Long: continue to control cardiac risk factors with healthy lifestyle choices and medication management.              Core Components/Risk Factors/Patient Goals at Discharge (Final Review):   Goals and Risk Factor Review - 12/15/22 0949       Core Components/Risk Factors/Patient Goals Review   Personal Goals Review Lipids;Weight Management/Obesity    Review Patient reports his weight has been steady. He also continues to take all cholesterol meds as prescirbed. He just recently went to his doctor for lab work and all his levels were within acceptable ranges. He will continue to follow up with his doctor to  monitor his cholesterol and lipid levels.    Expected Outcomes Short: continue to attend cardiac rehab classes and take meds as prescribed. Long: continue to control cardiac risk factors with healthy lifestyle choices and medication management.             ITP Comments:  ITP Comments     Row Name 09/11/22 1341 09/15/22 1734 09/22/22 1054 10/01/22 1038 10/29/22 1213   ITP Comments Initial phone call completed. Diagnosis can be found in [facility] 7/25. EP Orientation scheduled for Monday 8/19 at 2:30. Completed and gym orientation. Initial ITP created and sent for review to Dr. Bethann Punches, Medical Director. First full day of exercise!  Patient was oriented to gym and equipment including functions, settings, policies, and procedures.  Patient's individual exercise prescription and treatment plan were reviewed.  All starting workloads were established based on the results of the 6 minute walk test done at initial orientation visit.  The plan for exercise progression was also introduced and progression will be customized based on patient's performance and goals. 30 Day review completed. Medical Director ITP review done, changes made as directed, and signed approval by Medical Director.     new to program 30 Day review completed. Medical Director ITP review done, changes made as directed, and signed approval by Medical Director.    Row Name 11/26/22 1303 12/17/22 1152         ITP Comments 30 Day review completed. Medical Director ITP review done, changes made as directed, and signed approval by Medical Director. 30 Day review completed. Medical Director ITP review done, changes made as directed, and signed approval by Medical Director.               Comments:

## 2022-12-19 ENCOUNTER — Encounter: Payer: Medicare Other | Admitting: *Deleted

## 2022-12-19 VITALS — Ht 68.8 in | Wt 173.3 lb

## 2022-12-19 DIAGNOSIS — Z955 Presence of coronary angioplasty implant and graft: Secondary | ICD-10-CM | POA: Diagnosis not present

## 2022-12-19 NOTE — Patient Instructions (Signed)
Discharge Patient Instructions  Patient Details  Name: Willie Griffin MRN: [id number] Date of Birth: [birth year] Referring Provider:  Iran Ouch, MD   Number of Visits: 11  Reason for Discharge:  Patient reached a stable level of exercise. Patient independent in their exercise. Patient has met program and personal goals.  Diagnosis:  Status post coronary artery stent placement  Initial Exercise Prescription:  Initial Exercise Prescription - 11/20/22 1000       Treadmill   METs 3.72      Recumbant Bike   Watts 48    METs 3.9      NuStep   METs 3.9      REL-XR   Level 7    Minutes 15    METs 6.8             Discharge Exercise Prescription (Final Exercise Prescription Changes):  Exercise Prescription Changes - 12/18/22 1600       Response to Exercise   Blood Pressure (Admit) 118/66    Blood Pressure (Exit) 98/58    Heart Rate (Admit) 74 bpm    Heart Rate (Exercise) 110 bpm    Heart Rate (Exit) 103 bpm    Oxygen Saturation (Admit) 98 %    Oxygen Saturation (Exercise) 96 %    Oxygen Saturation (Exit) 97 %    Rating of Perceived Exertion (Exercise) 15    Perceived Dyspnea (Exercise) 0    Symptoms none    Duration Progress to 30 minutes of  aerobic without signs/symptoms of physical distress    Intensity THRR unchanged   98-136     Progression   Progression Continue to progress workloads to maintain intensity without signs/symptoms of physical distress.    Average METs 5.48      Resistance Training   Training Prescription Yes    Weight 5 lb    Reps 10-15      Interval Training   Interval Training No      Treadmill   MPH 3    Grade 3    Minutes 15    METs 4.54      NuStep   Level 4    Minutes 15    METs 6      REL-XR   Level 8    Minutes 15    METs 7.9      Oxygen   Maintain Oxygen Saturation 88% or higher             Functional Capacity:  6 Minute Walk     Row Name 09/15/22 1711 12/19/22 0937       6 Minute Walk    Phase Initial Discharge    Distance 1440 feet 1740 feet    Walk Time 6 minutes 6 minutes    # of Rest Breaks -- 0    MPH 2.72 3.3    METS 3.59 4.45    RPE 12 14    Perceived Dyspnea  0 0    VO2 Peak 12.56 15.57    Symptoms Yes (comment) Yes (comment)    Comments Knee pain and Left hip pain Knee pain    Resting HR 60 bpm 65 bpm    Resting BP 120/60 130/58    Resting Oxygen Saturation  97 % 97 %    Exercise Oxygen Saturation  during 6 min walk 67 % 95 %    Max Ex. HR 102 bpm 112 bpm    Max Ex. BP 140/72 162/66    2 Minute  Post BP 126/66 --            Nutrition & Weight - Outcomes:  Pre Biometrics - 09/15/22 1719       Pre Biometrics   Height 5' 8.8" (1.748 m)    Weight 179 lb (81.2 kg)    Waist Circumference 38 inches    Hip Circumference 40.5 inches    Waist to Hip Ratio 0.94 %    BMI (Calculated) 26.57    Single Leg Stand 30 seconds             Post Biometrics - 12/19/22 0940        Post  Biometrics   Height 5' 8.8" (1.748 m)    Weight 173 lb 4.8 oz (78.6 kg)    Waist Circumference 35 inches    Hip Circumference 36 inches    Waist to Hip Ratio 0.97 %    BMI (Calculated) 25.73    Single Leg Stand 30 seconds             Nutrition:  Nutrition Therapy & Goals - 09/22/22 1154       Nutrition Therapy   Diet Cardiac, low sodum    Protein (specify units) 90    Fiber 30 grams    Whole Grain Foods 3 servings    Saturated Fats 15 max. grams    Fruits and Vegetables 5 servings/day    Sodium 2 grams      Personal Nutrition Goals   Nutrition Goal Drink more water, ~64oz, less sugary beverages    Personal Goal #2 Watch carb portions, pair carbs with protein at meals    Personal Goal #3 Work on building better habits with new routine    Comments Patient drinking some water, ~32oz but is mostly drinking high calorie sugary beverages. Says he drinks them because he likes them. Spoke with him about the importance of water and reducing sugar intake. He is  agreeable to setting a goal of more water, less sugary beverages. He reports he doesn't have a routine, and struggles to be consistent with his eating habits. Sometimes missing meal or snacking on junk. Discussed setting goal to be more consistent. He reports he will be going back to work soon, and will have more of a routine. Reviewed mediterranean diet handout, types of fats, sources, how to read label. Brainstormed and built out several meals and snacks with foods he likes and will eat, focusing on controlled portions of carbs, paired with healthier proteins and fats.      Intervention Plan   Intervention Prescribe, educate and counsel regarding individualized specific dietary modifications aiming towards targeted core components such as weight, hypertension, lipid management, diabetes, heart failure and other comorbidities.;Nutrition handout(s) given to patient.    Expected Outcomes Short Term Goal: Understand basic principles of dietary content, such as calories, fat, sodium, cholesterol and nutrients.;Short Term Goal: A plan has been developed with personal nutrition goals set during dietitian appointment.;Long Term Goal: Adherence to prescribed nutrition plan.

## 2022-12-19 NOTE — Progress Notes (Signed)
Daily Session Note  Patient Details  Name: Willie Griffin MRN: [id number] Date of Birth: 04-Sep-1955 Referring Provider:   Flowsheet Row Cardiac Rehab from 09/15/2022 in [facility] Cardiac and Pulmonary Rehab  Referring Provider Dr.Arida       Encounter Date: 12/19/2022  Check In:  Session Check In - 12/19/22 1005       Check-In   Supervising physician immediately available to respond to emergencies See telemetry face sheet for immediately available ER MD    Location [facility]-Cardiac & Pulmonary Rehab    Staff Present Cora Collum, RN, BSN, CCRP;Noah Tickle, BS, Exercise Physiologist;Joseph [place], [place]    Virtual Visit No    Medication changes reported     No    Fall or balance concerns reported    No    Warm-up and Cool-down Performed on first and last piece of equipment    Resistance Training Performed Yes    VAD Patient? No    PAD/SET Patient? No      Pain Assessment   Currently in Pain? No/denies                Social History   Tobacco Use  Smoking Status Never   Passive exposure: Past  Smokeless Tobacco Never    Goals Met:  Independence with exercise equipment Exercise tolerated well No report of concerns or symptoms today  Goals Unmet:  Not Applicable  Comments: Pt able to follow exercise prescription today without complaint.  Will continue to monitor for progression.    Dr. Bethann Punches is Medical Director for [facility] Cardiac Rehabilitation.  Dr. Vida Rigger is Medical Director for [facility] Pulmonary Rehabilitation.

## 2022-12-22 ENCOUNTER — Encounter: Payer: Medicare Other | Admitting: *Deleted

## 2022-12-22 DIAGNOSIS — Z955 Presence of coronary angioplasty implant and graft: Secondary | ICD-10-CM

## 2022-12-22 NOTE — Progress Notes (Signed)
Daily Session Note  Patient Details  Name: Willie Griffin MRN: [id number] Date of Birth: 10-25-1955 Referring Provider:   Flowsheet Row Cardiac Rehab from 09/15/2022 in [facility] Cardiac and Pulmonary Rehab  Referring Provider Dr.Arida       Encounter Date: 12/22/2022  Check In:  Session Check In - 12/22/22 0926       Check-In   Supervising physician immediately available to respond to emergencies See telemetry face sheet for immediately available ER MD    Location [facility]-Cardiac & Pulmonary Rehab    Staff Present Cora Collum, RN, BSN, CCRP;Margaret Best, MS, Exercise Physiologist;Maxon Conetta BS, , Exercise Physiologist;Zarin Knupp Katrinka Blazing, RN, ADN    Virtual Visit No    Medication changes reported     No    Fall or balance concerns reported    No    Warm-up and Cool-down Performed on first and last piece of equipment    Resistance Training Performed Yes    VAD Patient? No    PAD/SET Patient? No      Pain Assessment   Currently in Pain? No/denies                Social History   Tobacco Use  Smoking Status Never   Passive exposure: Past  Smokeless Tobacco Never    Goals Met:  Independence with exercise equipment Exercise tolerated well No report of concerns or symptoms today Strength training completed today  Goals Unmet:  Not Applicable  Comments: Pt able to follow exercise prescription today without complaint.  Will continue to monitor for progression.    Dr. Bethann Punches is Medical Director for [facility] Cardiac Rehabilitation.  Dr. Vida Rigger is Medical Director for [facility] Pulmonary Rehabilitation.

## 2022-12-28 ENCOUNTER — Encounter: Payer: Self-pay | Admitting: Emergency Medicine

## 2022-12-28 ENCOUNTER — Ambulatory Visit
Admission: EM | Admit: 2022-12-28 | Discharge: 2022-12-28 | Disposition: A | Discharge status: Home / Self Care | Payer: Medicare Other | Attending: Physician Assistant | Admitting: Physician Assistant

## 2022-12-28 DIAGNOSIS — J329 Chronic sinusitis, unspecified: Secondary | ICD-10-CM

## 2022-12-28 DIAGNOSIS — J40 Bronchitis, not specified as acute or chronic: Secondary | ICD-10-CM | POA: Diagnosis not present

## 2022-12-28 DIAGNOSIS — R051 Acute cough: Secondary | ICD-10-CM | POA: Diagnosis not present

## 2022-12-28 MED ORDER — BENZONATATE 100 MG PO CAPS: 100.0000 mg | ORAL_CAPSULE | Freq: Three times a day (TID) | ORAL | 0 refills | Status: DC

## 2022-12-28 MED ORDER — DOXYCYCLINE HYCLATE 100 MG PO CAPS: 100.0000 mg | ORAL_CAPSULE | Freq: Two times a day (BID) | ORAL | 0 refills | Status: DC

## 2022-12-28 NOTE — ED Triage Notes (Signed)
Pt presents with productive cough and congestion x 2 weeks. Pt has tried OTC cough medication for his symptoms.

## 2022-12-28 NOTE — ED Provider Notes (Signed)
Renaldo Fiddler    CSN: [id number] Arrival date & time: 12/28/22  0930      History   Chief Complaint Chief Complaint  Patient presents with   Cough   Nasal Congestion    HPI Willie Griffin is a 67 y.o. male.   Patient presents today with a 2-week history of cough and congestion.  Denies any fever, chest pain, shortness of breath, nausea, vomiting.  He does have a history of allergies but has not taken any over-the-counter medication for symptom management.  He denies any recent antibiotics or steroids.  Denies any history of asthma, COPD, smoking.  Denies any known sick contacts.  He is having difficulty sleeping at night as a result of the coughing.  He is eating and drinking normally.  He has tried multiple over-the-counter medications including Mucinex, Tylenol, Flonase without improvement of symptoms.    Past Medical History:  Diagnosis Date   Allergy    allergic rhinitis   GERD (gastroesophageal reflux disease)    Hemorrhoid    Hypothyroidism    Peyronie disease     Patient Active Problem List   Diagnosis Date Noted   Hyperlipidemia LDL goal <70 08/22/2022   Stable angina (HCC) 08/21/2022   Abnormal cardiac CT angiography 08/21/2022   Coronary artery disease of native artery of native heart with stable angina pectoris (HCC) 08/21/2022   Coronary artery calcification 11/23/2020   DOE (dyspnea on exertion) 11/23/2019   Spondylosis of lumbosacral region without myelopathy or radiculopathy 01/14/2018   GERD (gastroesophageal reflux disease) 10/20/2017   Chronic back pain 03/17/2017   Hypothyroidism    Lumbar radiculopathy 04/24/2015   Rosacea 10/21/2013   Routine general medical examination at a health care facility 08/09/2010   Allergic rhinitis 03/17/2007   Peyronie's disease 02/19/2007    Past Surgical History:  Procedure Laterality Date   COLONOSCOPY     CORONARY STENT INTERVENTION N/A 08/21/2022   Procedure: CORONARY STENT INTERVENTION;  Surgeon:  Iran Ouch, MD;  Location: [facility] INVASIVE CV [facility];  Service: Cardiovascular;  Laterality: N/A;   INGUINAL HERNIA REPAIR  9/05   LEFT HEART CATH AND CORONARY ANGIOGRAPHY Left 08/21/2022   Procedure: LEFT HEART CATH AND CORONARY ANGIOGRAPHY;  Surgeon: Iran Ouch, MD;  Location: [facility] INVASIVE CV LAB;  Service: Cardiovascular;  Laterality: Left;   MOHS SURGERY     nasal SCC----[facility]   TOENAIL EXCISION     partial   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 Authorizing Provider  aspirin EC 81 MG tablet Take 1 tablet (81 mg total) by mouth daily. Swallow whole. 02/28/20  Yes Agbor-Etang, Arlys John, MD  atorvastatin (LIPITOR) 20 MG tablet Take 1 tablet (20 mg total) by mouth daily. 08/15/22 12/28/22 Yes Agbor-Etang, Arlys John, MD  benzonatate (TESSALON) 100 MG capsule Take 1 capsule (100 mg total) by mouth every 8 (eight) hours. 12/28/22  Yes Margarethe Virgen, Noberto Retort, PA-C  clopidogrel (PLAVIX) 75 MG tablet Take 1 tablet (75 mg total) by mouth daily with breakfast. 08/22/22  Yes Dunn, Raymon Mutton, PA-C  doxycycline (VIBRAMYCIN) 100 MG capsule Take 1 capsule (100 mg total) by mouth 2 (two) times daily. 12/28/22  Yes Geoffry Bannister, Noberto Retort, PA-C  levothyroxine (SYNTHROID) 25 MCG tablet TAKE 1 TABLET BY MOUTH EVERY DAY BEFORE BREAKFAST 09/18/22  Yes Karie Schwalbe, MD  pantoprazole (  PROTONIX) 40 MG tablet Take 1 tablet (40 mg total) by mouth daily. 08/22/22  Yes Dunn, Ryan M, PA-C  Cyanocobalamin (VITAMIN B-12 PO) Take 2 Doses by mouth daily.    [provider]  EPIPEN 2-PAK 0.3 MG/0.3ML SOAJ injection INJECT 0.3 MG INTRAMUSCULARLY ONCE AS NEEDED FOR UP TO 1 DOSE 12/03/21   Tillman Abide I, MD  hydrocortisone 2.5 % cream Use rectally twice daily as neeed 12/09/22   Karie Schwalbe, MD  OVER THE COUNTER MEDICATION Take 2 capsules by mouth daily. Synoviox includes MSM, turmeric, and hyaluronic acid     [provider]  SOOLANTRA 1 % CREA Apply topically daily. to face 01/15/17   [provider]  tadalafil (CIALIS) 20 MG tablet Take 0.5-1 tablets (10-20 mg total) by mouth every other day as needed for erectile dysfunction. 10/07/22   Karie Schwalbe, MD    Family History Family History  Problem Relation Age of Onset   Arthritis Mother    Heart attack Mother    Heart attack Father    Dementia Father        Lewy body dementia   Heart disease Father        stent x 11    Hodgkin's lymphoma Sister    Cancer Sister    Other Sister        precancerous polyps   Cancer Paternal Aunt        jaw ca   Celiac disease Paternal Uncle    Other Paternal Uncle        mesothelioma   Stroke Maternal Grandmother    Coronary artery disease Maternal Grandfather    Suicidality Paternal Grandmother    Breast cancer Paternal Grandmother    Coronary artery disease Paternal Grandfather    Breast cancer Other    Diabetes Neg Hx    Hypertension Neg Hx    Colon polyps Neg Hx    Colon cancer Neg Hx    Rectal cancer Neg Hx    Stomach cancer Neg Hx    Esophageal cancer Neg Hx     Social History Social History   Tobacco Use   Smoking status: Never    Passive exposure: Past   Smokeless tobacco: Never  Vaping Use   Vaping status: Never Used  Substance Use Topics   Alcohol use: No   Drug use: No     Allergies   Bee venom, Vitamin e, and Ciprofloxacin   Review of Systems Review of Systems  Constitutional:  Positive for activity change. Negative for appetite change, fatigue and fever.  HENT:  Positive for congestion and postnasal drip. Negative for sinus pressure, sneezing and sore throat.   Respiratory:  Positive for cough. Negative for shortness of breath.   Cardiovascular:  Negative for chest pain.  Gastrointestinal:  Negative for abdominal pain, diarrhea, nausea and vomiting.  Neurological:  Negative for dizziness, light-headedness and headaches.     Physical  Exam Triage Vital Signs ED Triage Vitals  Encounter Vitals Group     BP 12/28/22 1017 134/73     Systolic BP Percentile --      Diastolic BP Percentile --      Pulse Rate 12/28/22 1017 62     Resp 12/28/22 1017 16     Temp 12/28/22 1017 98.6 F (37 C)     Temp Source 12/28/22 1017 Oral     SpO2 12/28/22 1017 96 %     Weight --      Height --  Head Circumference --      Peak Flow --      Pain Score 12/28/22 1015 0     Pain Loc --      Pain Education --      Exclude from Growth Chart --    No data found.  Updated Vital Signs BP 134/73 (BP Location: Left Arm)   Pulse 62   Temp 98.6 F (37 C) (Oral)   Resp 16   SpO2 96%   Visual Acuity Right Eye Distance:   Left Eye Distance:   Bilateral Distance:    Right Eye Near:   Left Eye Near:    Bilateral Near:     Physical Exam Vitals reviewed.  Constitutional:      General: He is awake.     Appearance: Normal appearance. He is well-developed. He is not ill-appearing.     Comments: Very pleasant male appears stated age in no acute distress sitting comfortably in exam room  HENT:     Head: Normocephalic and atraumatic.     Right Ear: Tympanic membrane, ear canal and external ear normal. Tympanic membrane is not erythematous or bulging.     Left Ear: Tympanic membrane, ear canal and external ear normal. Tympanic membrane is not erythematous or bulging.     Nose: Nose normal.     Mouth/Throat:     Pharynx: Uvula midline. Postnasal drip present. No oropharyngeal exudate, posterior oropharyngeal erythema or uvula swelling.  Cardiovascular:     Rate and Rhythm: Normal rate and regular rhythm.     Heart sounds: Normal heart sounds, S1 normal and S2 normal. No murmur heard. Pulmonary:     Effort: Pulmonary effort is normal. No accessory muscle usage or respiratory distress.     Breath sounds: Normal breath sounds. No stridor. No wheezing, rhonchi or rales.     Comments: Clear to auscultation bilaterally Neurological:      Mental Status: He is alert.  Psychiatric:        Behavior: Behavior is cooperative.      UC Treatments / Results  Labs (all labs ordered are listed, but only abnormal results are displayed) Labs Reviewed - No data to display  EKG   Radiology No results found.  Procedures Procedures (including critical care time)  Medications Ordered in UC Medications - No data to display  Initial Impression / Assessment and Plan / UC Course  I have reviewed the triage vital signs and the nursing notes.  Pertinent labs & imaging results that were available during my care of the patient were reviewed by me and considered in my medical decision making (see chart for details).     Patient is well-appearing, afebrile, nontoxic, nontachycardic.  No indication for viral testing as patient has been symptomatic for several weeks and this would not change management.  Given prolonged and worsening symptoms will cover for secondary bacterial infection.  We did discuss potentiality of chest x-ray but given he has no adventitious lung sounds on exam and his oxygen saturation is appropriate we will defer this for the time being.  Will start doxycycline to cover for both sinusitis as well as mycoplasma pneumonia that is prevalent in the community.  Discussed that he is to avoid prolonged sun exposure while on this medication due to associated photosensitivity.  He can continue over-the-counter medications including Mucinex, Tylenol, Flonase.  He was given Tessalon for cough.  We discussed that he should rest and drink plenty of fluid.  If his symptoms are  not improving within a few days or if he has any worsening symptoms including fever, chest pain, shortness of breath, nausea/vomiting interfering with oral intake he needs to be seen immediately.  Final Clinical Impressions(s) / UC Diagnoses   Final diagnoses:  Sinobronchitis  Acute cough     Discharge Instructions      Start doxycycline 100 mg twice  daily for 10 days.  Stay out of the sun while on this medication.  Use Tessalon for cough.  You can use over-the-counter medication including Mucinex, Flonase, Tylenol.  Make sure that you rest and drink plenty of fluid.  If your symptoms are not improving within a few days please return for reevaluation.  If anything worsens and you have fever, chest pain, shortness of breath, worsening cough, nausea/vomiting, weakness you need to be seen immediately.     ED Prescriptions     Medication Sig Dispense Auth. Provider   doxycycline (VIBRAMYCIN) 100 MG capsule Take 1 capsule (100 mg total) by mouth 2 (two) times daily. 20 capsule Braelee Herrle K, PA-C   benzonatate (TESSALON) 100 MG capsule Take 1 capsule (100 mg total) by mouth every 8 (eight) hours. 21 capsule Reynaldo Rossman K, PA-C      PDMP not reviewed this encounter.   Jeani Hawking, PA-C 12/28/22 1045

## 2022-12-28 NOTE — Discharge Instructions (Signed)
Start doxycycline 100 mg twice daily for 10 days.  Stay out of the sun while on this medication.  Use Tessalon for cough.  You can use over-the-counter medication including Mucinex, Flonase, Tylenol.  Make sure that you rest and drink plenty of fluid.  If your symptoms are not improving within a few days please return for reevaluation.  If anything worsens and you have fever, chest pain, shortness of breath, worsening cough, nausea/vomiting, weakness you need to be seen immediately.

## 2022-12-29 ENCOUNTER — Encounter: Payer: Medicare Other | Attending: Cardiovascular Disease | Admitting: *Deleted

## 2022-12-29 DIAGNOSIS — Z955 Presence of coronary angioplasty implant and graft: Secondary | ICD-10-CM | POA: Diagnosis present

## 2022-12-29 NOTE — Progress Notes (Signed)
Daily Session Note  Patient Details  Name: LOWMAN JENNIGES MRN: [id number] Date of Birth: [birth year] Referring Provider:   Flowsheet Row Cardiac Rehab from 09/15/2022 in [facility] Cardiac and Pulmonary Rehab  Referring Provider Dr.Arida       Encounter Date: 12/29/2022  Check In:  Session Check In - 12/29/22 0950       Check-In   Supervising physician immediately available to respond to emergencies See telemetry face sheet for immediately available ER MD    Location [facility]-Cardiac & Pulmonary Rehab    Staff Present Rory Percy, MS, Exercise Physiologist;Kelly Madilyn Fireman, BS, ACSM CEP, Exercise Physiologist;Richetta Cubillos Katrinka Blazing, RN, ADN;Laureen Manson Passey, BS, RRT, CPFT    Virtual Visit No    Medication changes reported     No    Fall or balance concerns reported    No    Warm-up and Cool-down Performed on first and last piece of equipment    Resistance Training Performed Yes    VAD Patient? No    PAD/SET Patient? No      Pain Assessment   Currently in Pain? No/denies                Social History   Tobacco Use  Smoking Status Never   Passive exposure: Past  Smokeless Tobacco Never    Goals Met:  Independence with exercise equipment Exercise tolerated well No report of concerns or symptoms today Strength training completed today  Goals Unmet:  Not Applicable  Comments: Pt able to follow exercise prescription today without complaint.  Will continue to monitor for progression.    Dr. Bethann Punches is Medical Director for [facility] Cardiac Rehabilitation.  Dr. Vida Rigger is Medical Director for [facility] Pulmonary Rehabilitation.

## 2023-01-01 ENCOUNTER — Ambulatory Visit: Payer: Medicare Other

## 2023-01-02 ENCOUNTER — Encounter: Payer: Medicare Other | Admitting: *Deleted

## 2023-01-02 DIAGNOSIS — Z955 Presence of coronary angioplasty implant and graft: Secondary | ICD-10-CM

## 2023-01-02 NOTE — Progress Notes (Signed)
Daily Session Note  Patient Details  Name: PLUTARCO MATUSEK MRN: [id number] Date of Birth: [birth year] Referring Provider:   Flowsheet Row Cardiac Rehab from 09/15/2022 in [facility] Cardiac and Pulmonary Rehab  Referring Provider Dr.Arida       Encounter Date: 01/02/2023  Check In:  Session Check In - 01/02/23 0945       Check-In   Supervising physician immediately available to respond to emergencies See telemetry face sheet for immediately available ER MD    Location [facility]-Cardiac & Pulmonary Rehab    Staff Present Cora Collum, RN, BSN, CCRP;Noah Tickle, BS, Exercise Physiologist;Joseph [place], [place]    Virtual Visit No    Medication changes reported     No    Fall or balance concerns reported    No    Warm-up and Cool-down Performed on first and last piece of equipment    Resistance Training Performed Yes    VAD Patient? No    PAD/SET Patient? No      Pain Assessment   Currently in Pain? No/denies                Social History   Tobacco Use  Smoking Status Never   Passive exposure: Past  Smokeless Tobacco Never    Goals Met:  Independence with exercise equipment Exercise tolerated well No report of concerns or symptoms today  Goals Unmet:  Not Applicable  Comments: Pt able to follow exercise prescription today without complaint.  Will continue to monitor for progression.    Dr. Bethann Punches is Medical Director for [facility] Cardiac Rehabilitation.  Dr. Vida Rigger is Medical Director for [facility] Pulmonary Rehabilitation.

## 2023-01-05 ENCOUNTER — Encounter: Payer: Medicare Other | Admitting: *Deleted

## 2023-01-07 ENCOUNTER — Encounter: Payer: Medicare Other | Admitting: *Deleted

## 2023-01-09 ENCOUNTER — Encounter: Payer: Medicare Other | Admitting: *Deleted

## 2023-01-09 DIAGNOSIS — Z955 Presence of coronary angioplasty implant and graft: Secondary | ICD-10-CM | POA: Diagnosis not present

## 2023-01-09 NOTE — Progress Notes (Signed)
Daily Session Note  Patient Details  Name: Willie Griffin MRN: [id number] Date of Birth: [birth year] Referring Provider:   Flowsheet Row Cardiac Rehab from 09/15/2022 in [facility] Cardiac and [facility] Rehab  Referring Provider Dr.Arida       Encounter Date: 01/09/2023  Check In:  Session Check In - 01/09/23 1101       Check-In   Supervising physician immediately available to respond to emergencies See telemetry face sheet for immediately available ER MD    Location [facility]-Cardiac & Pulmonary Rehab    Staff Present Ronette Deter, BS, Exercise Physiologist;Joseph Shelbie Proctor, RN, [place]    Virtual Visit No    Medication changes reported     No    Fall or balance concerns reported    No    Warm-up and Cool-down Performed on first and last piece of equipment    Resistance Training Performed Yes    VAD Patient? No    PAD/SET Patient? No      Pain Assessment   Currently in Pain? No/denies                Social History   Tobacco Use  Smoking Status Never   Passive exposure: Past  Smokeless Tobacco Never    Goals Met:  Independence with exercise equipment Exercise tolerated well No report of concerns or symptoms today Strength training completed today  Goals Unmet:  Not Applicable  Comments: Pt able to follow exercise prescription today without complaint.  Will continue to monitor for progression.    Dr. Bethann Punches is Medical Director for [facility] Cardiac Rehabilitation.  Dr. Vida Rigger is Medical Director for [facility] Pulmonary Rehabilitation.

## 2023-01-13 ENCOUNTER — Telehealth: Payer: Self-pay | Admitting: Emergency Medicine

## 2023-01-13 ENCOUNTER — Ambulatory Visit (INDEPENDENT_AMBULATORY_CARE_PROVIDER_SITE_OTHER): Payer: Medicare Other

## 2023-01-13 ENCOUNTER — Ambulatory Visit
Admission: EM | Admit: 2023-01-13 | Discharge: 2023-01-13 | Disposition: A | Discharge status: Home / Self Care | Payer: Medicare Other | Attending: Emergency Medicine | Admitting: Emergency Medicine

## 2023-01-13 DIAGNOSIS — R051 Acute cough: Secondary | ICD-10-CM

## 2023-01-13 DIAGNOSIS — J069 Acute upper respiratory infection, unspecified: Secondary | ICD-10-CM | POA: Diagnosis not present

## 2023-01-13 MED ORDER — PROMETHAZINE-DM 6.25-15 MG/5ML PO SYRP: 5.0000 mL | ORAL_SOLUTION | Freq: Four times a day (QID) | ORAL | 0 refills | Status: DC | PRN

## 2023-01-13 MED ORDER — AZITHROMYCIN 250 MG PO TABS: 250.0000 mg | ORAL_TABLET | Freq: Every day | ORAL | 0 refills | Status: DC

## 2023-01-13 MED ORDER — PREDNISONE 10 MG (21) PO TBPK: ORAL_TABLET | Freq: Every day | ORAL | 0 refills | Status: DC

## 2023-01-13 NOTE — ED Provider Notes (Signed)
Renaldo Fiddler    CSN: [id number] Arrival date & time: 01/13/23  2595      History   Chief Complaint Chief Complaint  Patient presents with   Cough    HPI Willie Griffin is a 67 y.o. male.   Patient presents for evaluation of a persisting cough present for 3 to 4 weeks.  Initial accompanying nasal congestion has improved but has not fully resolved.  Sinus pressure has resolved.  Was evaluated in this urgent care 2 weeks ago, prescribed doxycycline which she completed 3 to 4 days ago.  Believes symptoms have worsened since.  Cough interfering with sleep and has been more more prominent.  Denies presence of shortness of breath or wheezing.  Tolerating food and liquids.  Additionally has attempted use of DayQuil.  Tempted use of prescribed Perles which have been ineffective.  Past Medical History:  Diagnosis Date   Allergy    allergic rhinitis   GERD (gastroesophageal reflux disease)    Hemorrhoid    Hypothyroidism    Peyronie disease     Patient Active Problem List   Diagnosis Date Noted   Hyperlipidemia LDL goal <70 08/22/2022   Stable angina (HCC) 08/21/2022   Abnormal cardiac CT angiography 08/21/2022   Coronary artery disease of native artery of native heart with stable angina pectoris (HCC) 08/21/2022   Coronary artery calcification 11/23/2020   DOE (dyspnea on exertion) 11/23/2019   Spondylosis of lumbosacral region without myelopathy or radiculopathy 01/14/2018   GERD (gastroesophageal reflux disease) 10/20/2017   Chronic back pain 03/17/2017   Hypothyroidism    Lumbar radiculopathy 04/24/2015   Rosacea 10/21/2013   Routine general medical examination at a health care facility 08/09/2010   Allergic rhinitis 03/17/2007   Peyronie's disease 02/19/2007    Past Surgical History:  Procedure Laterality Date   COLONOSCOPY     CORONARY STENT INTERVENTION N/A 08/21/2022   Procedure: CORONARY STENT INTERVENTION;  Surgeon: Iran Ouch, MD;  Location: [facility]  INVASIVE CV LAB;  Service: Cardiovascular;  Laterality: N/A;   INGUINAL HERNIA REPAIR  9/05   LEFT HEART CATH AND CORONARY ANGIOGRAPHY Left 08/21/2022   Procedure: LEFT HEART CATH AND CORONARY ANGIOGRAPHY;  Surgeon: Iran Ouch, MD;  Location: [facility] INVASIVE CV LAB;  Service: Cardiovascular;  Laterality: Left;   MOHS SURGERY     nasal SCC----[facility]   TOENAIL EXCISION     partial   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 Authorizing Provider  promethazine-dextromethorphan (PROMETHAZINE-DM) 6.25-15 MG/5ML syrup Take 5 mLs by mouth 4 (four) times daily as needed for cough. 01/13/23  Yes Valinda Hoar, NP  aspirin EC 81 MG tablet Take 1 tablet (81 mg total) by mouth daily. Swallow whole. 02/28/20   Debbe Odea, MD  atorvastatin (LIPITOR) 20 MG tablet Take 1 tablet (20 mg total) by mouth daily. 08/15/22 12/28/22  Debbe Odea, MD  benzonatate (TESSALON) 100 MG capsule Take 1 capsule (100 mg total) by mouth every 8 (eight) hours. 12/28/22   Raspet, Noberto Retort, PA-C  clopidogrel (PLAVIX) 75 MG tablet Take 1 tablet (75 mg total) by mouth daily with breakfast. 08/22/22   Dunn, Raymon Mutton, PA-C  Cyanocobalamin (VITAMIN B-12 PO) Take 2 Doses by mouth daily.    [provider]  doxycycline (VIBRAMYCIN) 100 MG capsule Take 1 capsule (100 mg  total) by mouth 2 (two) times daily. Patient not taking: Reported on 01/13/2023 12/28/22   Raspet, Erin K, PA-C  EPIPEN 2-PAK 0.3 MG/0.3ML SOAJ injection INJECT 0.3 MG INTRAMUSCULARLY ONCE AS NEEDED FOR UP TO 1 DOSE 12/03/21   Tillman Abide I, MD  hydrocortisone 2.5 % cream Use rectally twice daily as neeed 12/09/22   Karie Schwalbe, MD  levothyroxine (SYNTHROID) 25 MCG tablet TAKE 1 TABLET BY MOUTH EVERY DAY BEFORE BREAKFAST 09/18/22   Karie Schwalbe, MD  OVER THE COUNTER MEDICATION Take 2 capsules by mouth daily. Synoviox includes  MSM, turmeric, and hyaluronic acid    [provider]  pantoprazole (PROTONIX) 40 MG tablet Take 1 tablet (40 mg total) by mouth daily. 08/22/22   Dunn, Raymon Mutton, PA-C  SOOLANTRA 1 % CREA Apply topically daily. to face 01/15/17   [provider]  tadalafil (CIALIS) 20 MG tablet Take 0.5-1 tablets (10-20 mg total) by mouth every other day as needed for erectile dysfunction. 10/07/22   Karie Schwalbe, MD    Family History Family History  Problem Relation Age of Onset   Arthritis Mother    Heart attack Mother    Heart attack Father    Dementia Father        Lewy body dementia   Heart disease Father        stent x 11    Hodgkin's lymphoma Sister    Cancer Sister    Other Sister        precancerous polyps   Cancer Paternal Aunt        jaw ca   Celiac disease Paternal Uncle    Other Paternal Uncle        mesothelioma   Stroke Maternal Grandmother    Coronary artery disease Maternal Grandfather    Suicidality Paternal Grandmother    Breast cancer Paternal Grandmother    Coronary artery disease Paternal Grandfather    Breast cancer Other    Diabetes Neg Hx    Hypertension Neg Hx    Colon polyps Neg Hx    Colon cancer Neg Hx    Rectal cancer Neg Hx    Stomach cancer Neg Hx    Esophageal cancer Neg Hx     Social History Social History   Tobacco Use   Smoking status: Never    Passive exposure: Past   Smokeless tobacco: Never  Vaping Use   Vaping status: Never Used  Substance Use Topics   Alcohol use: No   Drug use: No     Allergies   Bee venom, Vitamin e, and Ciprofloxacin   Review of Systems Review of Systems   Physical Exam Triage Vital Signs ED Triage Vitals  Encounter Vitals Group     BP 01/13/23 0945 (!) 148/73     Systolic BP Percentile --      Diastolic BP Percentile --      Pulse Rate 01/13/23 0945 71     Resp 01/13/23 0945 18     Temp 01/13/23 0945 98.1 F (36.7 C)     Temp Source 01/13/23 0945 Oral     SpO2 01/13/23 0945 96 %      Weight --      Height --      Head Circumference --      Peak Flow --      Pain Score 01/13/23 0941 3     Pain Loc --      Pain Education --  Exclude from Growth Chart --    No data found.  Updated Vital Signs BP (!) 148/73 (BP Location: Left Arm)   Pulse 71   Temp 98.1 F (36.7 C) (Oral)   Resp 18   SpO2 96%   Visual Acuity Right Eye Distance:   Left Eye Distance:   Bilateral Distance:    Right Eye Near:   Left Eye Near:    Bilateral Near:     Physical Exam Constitutional:      Appearance: Normal appearance.  HENT:     Head: Normocephalic.     Right Ear: Tympanic membrane, ear canal and external ear normal.     Left Ear: Tympanic membrane, ear canal and external ear normal.     Nose: Congestion present. No rhinorrhea.     Mouth/Throat:     Mouth: Mucous membranes are moist.     Pharynx: Oropharynx is clear. Posterior oropharyngeal erythema present. No oropharyngeal exudate.  Eyes:     Extraocular Movements: Extraocular movements intact.  Cardiovascular:     Rate and Rhythm: Normal rate and regular rhythm.     Pulses: Normal pulses.     Heart sounds: Normal heart sounds.  Pulmonary:     Effort: Pulmonary effort is normal.     Comments: Rhonchi to the right lower lobe Musculoskeletal:     Cervical back: Normal range of motion and neck supple.  Neurological:     Mental Status: He is alert and oriented to person, place, and time. Mental status is at baseline.      UC Treatments / Results  Labs (all labs ordered are listed, but only abnormal results are displayed) Labs Reviewed - No data to display  EKG   Radiology No results found.  Procedures Procedures (including critical care time)  Medications Ordered in UC Medications - No data to display  Initial Impression / Assessment and Plan / UC Course  I have reviewed the triage vital signs and the nursing notes.  Pertinent labs & imaging results that were available during my care of the  patient were reviewed by me and considered in my medical decision making (see chart for details).  Acute URI, acute cough  Vitals are stable and O2 saturation 96% on room air, rhonchi heard to the right lower lobes, remaining lobes clear, completed doxycycline 10-day course 3 to 4 days ago and symptoms worsen therefore chest x-ray completed, will make further clinical decisions based on results, discussed with patient, will review via telephone, prescribe Promethazine DM as Tessalon has been ineffective for home use Final Clinical Impressions(s) / UC Diagnoses   Final diagnoses:  Acute URI  Acute cough     Discharge Instructions      Today you are evaluated for your persisting cough  Chest x-ray is pending, we will discuss results via telephone and the treatment plan further based on the results  You may use cough syrup at bedtime as needed for comfort, may use throughout the day if it does not make you too drowsy    You can take Tylenol and/or Ibuprofen as needed for fever reduction and pain relief.   For cough: honey 1/2 to 1 teaspoon (you can dilute the honey in water or another fluid).  You can also use guaifenesin and dextromethorphan for cough. You can use a humidifier for chest congestion and cough.  If you don't have a humidifier, you can sit in the bathroom with the hot shower running.      For sore  throat: try warm salt water gargles, cepacol lozenges, throat spray, warm tea or water with lemon/honey, popsicles or ice, or OTC cold relief medicine for throat discomfort.   For congestion: take a daily anti-histamine like Zyrtec, Claritin, and a oral decongestant, such as pseudoephedrine.  You can also use Flonase 1-2 sprays in each nostril daily.   It is important to stay hydrated: drink plenty of fluids (water, gatorade/powerade/pedialyte, juices, or teas) to keep your throat moisturized and help further relieve irritation/discomfort.    ED Prescriptions     Medication  Sig Dispense Auth. Provider   promethazine-dextromethorphan (PROMETHAZINE-DM) 6.25-15 MG/5ML syrup Take 5 mLs by mouth 4 (four) times daily as needed for cough. 118 mL Willie Griffin, Elita Boone, NP      PDMP not reviewed this encounter.   Valinda Hoar, NP 01/13/23 1018

## 2023-01-13 NOTE — Telephone Encounter (Signed)
Chest x-ray reported via telephone, 2 patient identifiers used, negative, promethazine DM sent in at time of visit, will do an additional antibiotic course of azithromycin and additionally prescribed prednisone, recommended follow-up if symptoms still continue to persist or worsen at any point

## 2023-01-13 NOTE — Discharge Instructions (Signed)
Today you are evaluated for your persisting cough  Chest x-ray is pending, we will discuss results via telephone and the treatment plan further based on the results  You may use cough syrup at bedtime as needed for comfort, may use throughout the day if it does not make you too drowsy    You can take Tylenol and/or Ibuprofen as needed for fever reduction and pain relief.   For cough: honey 1/2 to 1 teaspoon (you can dilute the honey in water or another fluid).  You can also use guaifenesin and dextromethorphan for cough. You can use a humidifier for chest congestion and cough.  If you don't have a humidifier, you can sit in the bathroom with the hot shower running.      For sore throat: try warm salt water gargles, cepacol lozenges, throat spray, warm tea or water with lemon/honey, popsicles or ice, or OTC cold relief medicine for throat discomfort.   For congestion: take a daily anti-histamine like Zyrtec, Claritin, and a oral decongestant, such as pseudoephedrine.  You can also use Flonase 1-2 sprays in each nostril daily.   It is important to stay hydrated: drink plenty of fluids (water, gatorade/powerade/pedialyte, juices, or teas) to keep your throat moisturized and help further relieve irritation/discomfort.

## 2023-01-13 NOTE — ED Triage Notes (Signed)
Patient to Urgent Care with complaints of dry/ deep cough. Able to produce very little Symptoms started three weeks ago. Denies any fevers.   Meds: finished 10 days of doxycyline but cough returned and is now worse.

## 2023-01-14 ENCOUNTER — Encounter: Payer: Self-pay | Admitting: *Deleted

## 2023-01-14 DIAGNOSIS — Z955 Presence of coronary angioplasty implant and graft: Secondary | ICD-10-CM

## 2023-01-14 NOTE — Progress Notes (Signed)
Cardiac Individual Treatment Plan  Patient Details  Name: Willie Griffin MRN: [id number] Date of Birth: Nov 12, 1955 Referring Provider:   Flowsheet Row Cardiac Rehab from 09/15/2022 in [facility] Cardiac and Pulmonary Rehab  Referring Provider Dr.Arida       Initial Encounter Date:  Flowsheet Row Cardiac Rehab from 09/15/2022 in [facility] Cardiac and Pulmonary Rehab  Date 09/15/22       Visit Diagnosis: Status post coronary artery stent placement  Patient's Home Medications on Admission:  Current Outpatient Medications:    aspirin EC 81 MG tablet, Take 1 tablet (81 mg total) by mouth daily. Swallow whole., Disp: 90 tablet, Rfl: 3   atorvastatin (LIPITOR) 20 MG tablet, Take 1 tablet (20 mg total) by mouth daily., Disp: 90 tablet, Rfl: 3   azithromycin (ZITHROMAX) 250 MG tablet, Take 1 tablet (250 mg total) by mouth daily. Take first 2 tablets together, then 1 every day until finished., Disp: 6 tablet, Rfl: 0   benzonatate (TESSALON) 100 MG capsule, Take 1 capsule (100 mg total) by mouth every 8 (eight) hours., Disp: 21 capsule, Rfl: 0   clopidogrel (PLAVIX) 75 MG tablet, Take 1 tablet (75 mg total) by mouth daily with breakfast., Disp: 30 tablet, Rfl: 11   Cyanocobalamin (VITAMIN B-12 PO), Take 2 Doses by mouth daily., Disp: , Rfl:    doxycycline (VIBRAMYCIN) 100 MG capsule, Take 1 capsule (100 mg total) by mouth 2 (two) times daily. (Patient not taking: Reported on 01/13/2023), Disp: 20 capsule, Rfl: 0   EPIPEN 2-PAK 0.3 MG/0.3ML SOAJ injection, INJECT 0.3 MG INTRAMUSCULARLY ONCE AS NEEDED FOR UP TO 1 DOSE, Disp: 2 each, Rfl: 1   hydrocortisone 2.5 % cream, Use rectally twice daily as neeed, Disp: 30 g, Rfl: 5   levothyroxine (SYNTHROID) 25 MCG tablet, TAKE 1 TABLET BY MOUTH EVERY DAY BEFORE BREAKFAST, Disp: 90 tablet, Rfl: 3   OVER THE COUNTER MEDICATION, Take 2 capsules by mouth daily. Synoviox includes MSM, turmeric, and hyaluronic acid, Disp: , Rfl:    pantoprazole (PROTONIX) 40 MG tablet,  Take 1 tablet (40 mg total) by mouth daily., Disp: 30 tablet, Rfl: 5   predniSONE (STERAPRED UNI-PAK 21 TAB) 10 MG (21) TBPK tablet, Take by mouth daily. Take 6 tabs by mouth daily  for 1 days, then 5 tabs for 1 days, then 4 tabs for 1 days, then 3 tabs for 1 days, 2 tabs for 1 days, then 1 tab by mouth daily for 1 days, Disp: 21 tablet, Rfl: 0   promethazine-dextromethorphan (PROMETHAZINE-DM) 6.25-15 MG/5ML syrup, Take 5 mLs by mouth 4 (four) times daily as needed for cough., Disp: 118 mL, Rfl: 0   SOOLANTRA 1 % CREA, Apply topically daily. to face, Disp: , Rfl: 5   tadalafil (CIALIS) 20 MG tablet, Take 0.5-1 tablets (10-20 mg total) by mouth every other day as needed for erectile dysfunction., Disp: 10 tablet, Rfl: 11  Past Medical History: Past Medical History:  Diagnosis Date   Allergy    allergic rhinitis   GERD (gastroesophageal reflux disease)    Hemorrhoid    Hypothyroidism    Peyronie disease     Tobacco Use: Social History   Tobacco Use  Smoking Status Never   Passive exposure: Past  Smokeless Tobacco Never    Labs: Review Flowsheet  More data exists      Latest Ref Rng & Units 09/12/2020 11/23/2020 04/08/2021 12/03/2021 12/09/2022  Labs for ITP Cardiac and Pulmonary Rehab  Cholestrol 0 - 200 mg/dL 409  811  151  161  111   LDL (calc) 0 - 99 mg/dL 703  87  94  99  58   HDL-C >39.00 mg/dL 37  50.09  38  38.18  29.93   Trlycerides 0.0 - 149.0 mg/dL 96  71.6  95  96.7  89.3      Exercise Target Goals: Exercise Program Goal: Individual exercise prescription set using results from initial 6 min walk test and THRR while considering  patient's activity barriers and safety.   Exercise Prescription Goal: Initial exercise prescription builds to 30-45 minutes a day of aerobic activity, 2-3 days per week.  Home exercise guidelines will be given to patient during program as part of exercise prescription that the participant will acknowledge.   Education: Aerobic Exercise: -  Group verbal and visual presentation on the components of exercise prescription. Introduces F.I.T.T principle from ACSM for exercise prescriptions.  Reviews F.I.T.T. principles of aerobic exercise including progression. Written material given at graduation.   Education: Resistance Exercise: - Group verbal and visual presentation on the components of exercise prescription. Introduces F.I.T.T principle from ACSM for exercise prescriptions  Reviews F.I.T.T. principles of resistance exercise including progression. Written material given at graduation.    Education: Exercise & Equipment Safety: - Individual verbal instruction and demonstration of equipment use and safety with use of the equipment. Flowsheet Row Cardiac Rehab from 09/15/2022 in [facility] Cardiac and Pulmonary Rehab  Date 09/15/22  Educator MB  Instruction Review Code 1- Verbalizes Understanding       Education: Exercise Physiology & General Exercise Guidelines: - Group verbal and written instruction with models to review the exercise physiology of the cardiovascular system and associated critical values. Provides general exercise guidelines with specific guidelines to those with heart or lung disease.  Flowsheet Row Cardiac Rehab from 09/15/2022 in [facility] Cardiac and Pulmonary Rehab  Education need identified 09/15/22       Education: Flexibility, Balance, Mind/Body Relaxation: - Group verbal and visual presentation with interactive activity on the components of exercise prescription. Introduces F.I.T.T principle from ACSM for exercise prescriptions. Reviews F.I.T.T. principles of flexibility and balance exercise training including progression. Also discusses the mind body connection.  Reviews various relaxation techniques to help reduce and manage stress (i.e. Deep breathing, progressive muscle relaxation, and visualization). Balance handout provided to take home. Written material given at graduation.   Activity Barriers & Risk  Stratification:  Activity Barriers & Cardiac Risk Stratification - 09/15/22 1713       Activity Barriers & Cardiac Risk Stratification   Activity Barriers Back Problems;Joint Problems    Cardiac Risk Stratification Moderate             6 Minute Walk:  6 Minute Walk     Row Name 09/15/22 1711 12/19/22 0937       6 Minute Walk   Phase Initial Discharge    Distance 1440 feet 1740 feet    Walk Time 6 minutes 6 minutes    # of Rest Breaks -- 0    MPH 2.72 3.3    METS 3.59 4.45    RPE 12 14    Perceived Dyspnea  0 0    VO2 Peak 12.56 15.57    Symptoms Yes (comment) Yes (comment)    Comments Knee pain and Left hip pain Knee pain    Resting HR 60 bpm 65 bpm    Resting BP 120/60 130/58    Resting Oxygen Saturation  97 % 97 %    Exercise Oxygen  Saturation  during 6 min walk 67 % 95 %    Max Ex. HR 102 bpm 112 bpm    Max Ex. BP 140/72 162/66    2 Minute Post BP 126/66 --             Oxygen Initial Assessment:   Oxygen Re-Evaluation:   Oxygen Discharge (Final Oxygen Re-Evaluation):   Initial Exercise Prescription:  Initial Exercise Prescription - 11/20/22 1000       Treadmill   METs 3.72      Recumbant Bike   Watts 48    METs 3.9      NuStep   METs 3.9      REL-XR   Level 7    Minutes 15    METs 6.8             Perform Capillary Blood Glucose checks as needed.  Exercise Prescription Changes:   Exercise Prescription Changes     Row Name 09/15/22 1700 11/20/22 1000 11/24/22 0900 12/04/22 1400 12/18/22 1600     Response to Exercise   Blood Pressure (Admit) 120/60 106/68 -- 102/58 118/66   Blood Pressure (Exercise) 140/72 -- -- 166/58 --   Blood Pressure (Exit) 126/66 108/58 -- 120/56 98/58   Heart Rate (Admit) 60 bpm 69 bpm -- 82 bpm 74 bpm   Heart Rate (Exercise) 102 bpm 122 bpm -- 112 bpm 110 bpm   Heart Rate (Exit) 68 bpm 82 bpm -- 85 bpm 103 bpm   Oxygen Saturation (Admit) 97 % -- -- 97 % 98 %   Oxygen Saturation (Exercise) 97 % --  -- 95 % 96 %   Oxygen Saturation (Exit) 97 % -- -- 97 % 97 %   Rating of Perceived Exertion (Exercise) 12 14 -- 15 15   Perceived Dyspnea (Exercise) 0 0 -- 0 0   Symptoms knee pain and left hip pain -- -- none none   Comments results -- -- -- --   Duration Progress to 30 minutes of  aerobic without signs/symptoms of physical distress Progress to 30 minutes of  aerobic without signs/symptoms of physical distress Progress to 30 minutes of  aerobic without signs/symptoms of physical distress Progress to 30 minutes of  aerobic without signs/symptoms of physical distress Progress to 30 minutes of  aerobic without signs/symptoms of physical distress   Intensity -- THRR unchanged THRR New  98-136 THRR unchanged  98-136 THRR unchanged  98-136     Progression   Progression -- Continue to progress workloads to maintain intensity without signs/symptoms of physical distress. Continue to progress workloads to maintain intensity without signs/symptoms of physical distress. Continue to progress workloads to maintain intensity without signs/symptoms of physical distress. Continue to progress workloads to maintain intensity without signs/symptoms of physical distress.   Average METs -- 4.25 4.25 4.25 5.48     Resistance Training   Training Prescription -- Yes Yes Yes Yes   Weight -- 5 lb 5 lb 5 lb 5 lb   Reps -- 10-15 10-15 10-15 10-15     Interval Training   Interval Training -- No No No No     Treadmill   MPH -- 2.8 2.8 3 3    Grade -- 1.5 1.5 3 3    Minutes -- 15 15 15 15    METs -- -- 3.72 4.54 4.54     Recumbant Bike   Level -- 2.5  48 watt 2.5  48 watt 4 --   Watts -- -- 48 39 --  Minutes -- 15 15 15  --   METs -- -- 3.9 3.55 --     NuStep   Level -- 4 4 6 4    Minutes -- 15 15 15 15    METs -- -- 3.9 -- 6     REL-XR   Level -- -- 7 8 8    Minutes -- -- 15 15 15    METs -- -- 6.8 7.6 7.9     T5 Nustep   Level -- -- -- 3 --   Minutes -- -- -- 15 --   METs -- -- -- 4.6 --     Oxygen    Maintain Oxygen Saturation -- 88% or higher 88% or higher 88% or higher 88% or higher    Row Name 12/30/22 1300             Response to Exercise   Blood Pressure (Admit) 124/64       Blood Pressure (Exit) 128/60       Heart Rate (Admit) 72 bpm       Heart Rate (Exercise) 112 bpm       Heart Rate (Exit) 101 bpm       Oxygen Saturation (Admit) 98 %       Oxygen Saturation (Exercise) 94 %       Oxygen Saturation (Exit) 96 %       Rating of Perceived Exertion (Exercise) 14       Perceived Dyspnea (Exercise) 0       Symptoms none       Duration Progress to 30 minutes of  aerobic without signs/symptoms of physical distress       Intensity THRR unchanged         Progression   Progression Continue to progress workloads to maintain intensity without signs/symptoms of physical distress.       Average METs 4.4         Resistance Training   Training Prescription Yes       Weight 5 lb       Reps 10-15         Interval Training   Interval Training No         Treadmill   MPH 3       Grade 4       Minutes 15       METs 4.95         REL-XR   Level 7       Minutes 15       METs 5.8         T5 Nustep   Level 2       Minutes 15       METs 2.6         Oxygen   Maintain Oxygen Saturation 88% or higher                Exercise Comments:   Exercise Comments     Row Name 09/22/22 1054           Exercise Comments First full day of exercise!  Patient was oriented to gym and equipment including functions, settings, policies, and procedures.  Patient's individual exercise prescription and treatment plan were reviewed.  All starting workloads were established based on the results of the 6 minute walk test done at initial orientation visit.  The plan for exercise progression was also introduced and progression will be customized based on patient's performance and goals.  Exercise Goals and Review:   Exercise Goals     Row Name 09/15/22 1719              Exercise Goals   Increase Physical Activity Yes       Intervention Provide advice, education, support and counseling about physical activity/exercise needs.;Develop an individualized exercise prescription for aerobic and resistive training based on initial evaluation findings, risk stratification, comorbidities and participant's personal goals.       Expected Outcomes Short Term: Attend rehab on a regular basis to increase amount of physical activity.;Long Term: Exercising regularly at least 3-5 days a week.;Long Term: Add in home exercise to make exercise part of routine and to increase amount of physical activity.       Increase Strength and Stamina Yes       Intervention Provide advice, education, support and counseling about physical activity/exercise needs.;Develop an individualized exercise prescription for aerobic and resistive training based on initial evaluation findings, risk stratification, comorbidities and participant's personal goals.       Expected Outcomes Short Term: Increase workloads from initial exercise prescription for resistance, speed, and METs.;Short Term: Perform resistance training exercises routinely during rehab and add in resistance training at home;Long Term: Improve cardiorespiratory fitness, muscular endurance and strength as measured by increased METs and functional capacity ( )       Able to understand and use rate of perceived exertion (RPE) scale Yes       Intervention Provide education and explanation on how to use RPE scale       Expected Outcomes Short Term: Able to use RPE daily in rehab to express subjective intensity level;Long Term:  Able to use RPE to guide intensity level when exercising independently       Able to understand and use Dyspnea scale Yes       Intervention Provide education and explanation on how to use Dyspnea scale       Expected Outcomes Short Term: Able to use Dyspnea scale daily in rehab to express subjective sense of shortness  of breath during exertion;Long Term: Able to use Dyspnea scale to guide intensity level when exercising independently       Knowledge and understanding of Target Heart Rate Range (THRR) Yes       Intervention Provide education and explanation of THRR including how the numbers were predicted and where they are located for reference       Expected Outcomes Short Term: Able to state/look up THRR;Long Term: Able to use THRR to govern intensity when exercising independently;Short Term: Able to use daily as guideline for intensity in rehab       Able to check pulse independently Yes       Intervention Provide education and demonstration on how to check pulse in carotid and radial arteries.;Review the importance of being able to check your own pulse for safety during independent exercise       Expected Outcomes Short Term: Able to explain why pulse checking is important during independent exercise;Long Term: Able to check pulse independently and accurately       Understanding of Exercise Prescription Yes       Intervention Provide education, explanation, and written materials on patient's individual exercise prescription       Expected Outcomes Short Term: Able to explain program exercise prescription;Long Term: Able to explain home exercise prescription to exercise independently                Exercise Goals Re-Evaluation :  Exercise Goals Re-Evaluation     Row Name 09/22/22 1054 11/20/22 1049 11/24/22 0946 12/04/22 1415 12/15/22 0944     Exercise Goal Re-Evaluation   Exercise Goals Review Able to understand and use rate of perceived exertion (RPE) scale;Able to understand and use Dyspnea scale;Knowledge and understanding of Target Heart Rate Range (THRR);Understanding of Exercise Prescription Increase Physical Activity;Increase Strength and Stamina;Understanding of Exercise Prescription Increase Strength and Stamina;Increase Physical Activity;Able to understand and use rate of perceived exertion  (RPE) scale;Able to understand and use Dyspnea scale;Knowledge and understanding of Target Heart Rate Range (THRR);Able to check pulse independently;Understanding of Exercise Prescription Increase Physical Activity;Increase Strength and Stamina;Understanding of Exercise Prescription Increase Physical Activity;Increase Strength and Stamina;Understanding of Exercise Prescription   Comments Reviewed RPE  and dyspnea scale, THR and program prescription with pt today.  Pt voiced understanding and was given a copy of goals to take home. Khasir continues to do well in rehab. He has been able to maintain his intensity on the XR as well as the Treadmill. We will continue to monitor his progress in the program. Reviewed home exercise with pt today.  Pt plans to walk at home for exercise.  Reviewed THR, pulse, RPE, sign and symptoms, pulse oximetery and when to call 911 or MD.  Also discussed weather considerations and indoor options.  Pt voiced understanding. Sammey continues to make improvements in rehab. He has been able to increase his level on the T4 nustep from level 4 to level 6. He was also able to increase his incline on the treadmill up to 3%. We will continue to monitor his progress in the program. Patient reports that he is not currently exercising at home. He was encouraged to add 1-2 days a week of walking at home on off days of rehab to aim toward exercising 4-5 days a week. High intensity interval training was also discussed with patient to start incorporating into his workouts in rehab classes as he feels he has made some progress but in plateuing a bit. He will start with 30 second high intensity intervals every 2-3 min. on the XR and TM.   Expected Outcomes Short: Use RPE daily to regulate intensity. Long: Follow program prescription in THR. Short: Continue to follow current exercise prescription. Long: Continue exercise to improve strength and stamina. Short: add 1-2 days a week a home walking. Long: become  independent with exercise routine. Short: Continue to increase treadmill workloads. Long: Continue to exercise to improve strength and stamina. Short: add 1-2 days a week of exerics at home and start interval training in cardiac rehab class. Long: continue with independent exercise program.    Row Name 12/18/22 1623 12/30/22 1355           Exercise Goal Re-Evaluation   Exercise Goals Review Increase Physical Activity;Increase Strength and Stamina;Understanding of Exercise Prescription Increase Physical Activity;Increase Strength and Stamina;Understanding of Exercise Prescription      Comments Lindwood continues to do well in rehab. He has been able to maintain his intensity on the treadmill at a speed of and an incline of 3%. He has also maintained his level on the T4 nustep at level 4. We will continue to monitor his progress in the program. Ackley is doing well in rehab. He was recently able to increase his incline on the treadmill from 3% to 4%. He has also been able to maintain his workload on both the T5 nustep and the XR. We will continue to monitor his progress in the  program.      Expected Outcomes Short: Continue to progressively increase treadmill workload. Long: Continue exercise to improve strength and stamina. Short: Continue to progressively increase treadmill workload. Long: Continue exercise to improve strength and stamina.               Discharge Exercise Prescription (Final Exercise Prescription Changes):  Exercise Prescription Changes - 12/30/22 1300       Response to Exercise   Blood Pressure (Admit) 124/64    Blood Pressure (Exit) 128/60    Heart Rate (Admit) 72 bpm    Heart Rate (Exercise) 112 bpm    Heart Rate (Exit) 101 bpm    Oxygen Saturation (Admit) 98 %    Oxygen Saturation (Exercise) 94 %    Oxygen Saturation (Exit) 96 %    Rating of Perceived Exertion (Exercise) 14    Perceived Dyspnea (Exercise) 0    Symptoms none    Duration Progress to 30 minutes of   aerobic without signs/symptoms of physical distress    Intensity THRR unchanged      Progression   Progression Continue to progress workloads to maintain intensity without signs/symptoms of physical distress.    Average METs 4.4      Resistance Training   Training Prescription Yes    Weight 5 lb    Reps 10-15      Interval Training   Interval Training No      Treadmill   MPH 3    Grade 4    Minutes 15    METs 4.95      REL-XR   Level 7    Minutes 15    METs 5.8      T5 Nustep   Level 2    Minutes 15    METs 2.6      Oxygen   Maintain Oxygen Saturation 88% or higher             Nutrition:  Target Goals: Understanding of nutrition guidelines, daily intake of sodium 1500mg , cholesterol 200mg , calories 30% from fat and 7% or less from saturated fats, daily to have 5 or more servings of fruits and vegetables.  Education: All About Nutrition: -Group instruction provided by verbal, written material, interactive activities, discussions, models, and posters to present general guidelines for heart healthy nutrition including fat, fiber, MyPlate, the role of sodium in heart healthy nutrition, utilization of the nutrition label, and utilization of this knowledge for meal planning. Follow up email sent as well. Written material given at graduation. Flowsheet Row Cardiac Rehab from 09/15/2022 in [facility] Cardiac and Pulmonary Rehab  Education need identified 09/15/22       Biometrics:  Pre Biometrics - 09/15/22 1719       Pre Biometrics   Height 5' 8.8" (1.748 m)    Weight 179 lb (81.2 kg)    Waist Circumference 38 inches    Hip Circumference 40.5 inches    Waist to Hip Ratio 0.94 %    BMI (Calculated) 26.57    Single Leg Stand 30 seconds             Post Biometrics - 12/19/22 0940        Post  Biometrics   Height 5' 8.8" (1.748 m)    Weight 173 lb 4.8 oz (78.6 kg)    Waist Circumference 35 inches    Hip Circumference 36 inches    Waist to Hip Ratio 0.97 %     BMI (Calculated) 25.73  Single Leg Stand 30 seconds             Nutrition Therapy Plan and Nutrition Goals:  Nutrition Therapy & Goals - 09/22/22 1154       Nutrition Therapy   Diet Cardiac, low sodum    Protein (specify units) 90    Fiber 30 grams    Whole Grain Foods 3 servings    Saturated Fats 15 max. grams    Fruits and Vegetables 5 servings/day    Sodium 2 grams      Personal Nutrition Goals   Nutrition Goal Drink more water, ~64oz, less sugary beverages    Personal Goal #2 Watch carb portions, pair carbs with protein at meals    Personal Goal #3 Work on building better habits with new routine    Comments Patient drinking some water, ~32oz but is mostly drinking high calorie sugary beverages. Says he drinks them because he likes them. Spoke with him about the importance of water and reducing sugar intake. He is agreeable to setting a goal of more water, less sugary beverages. He reports he doesn't have a routine, and struggles to be consistent with his eating habits. Sometimes missing meal or snacking on junk. Discussed setting goal to be more consistent. He reports he will be going back to work soon, and will have more of a routine. Reviewed mediterranean diet handout, types of fats, sources, how to read label. Brainstormed and built out several meals and snacks with foods he likes and will eat, focusing on controlled portions of carbs, paired with healthier proteins and fats.      Intervention Plan   Intervention Prescribe, educate and counsel regarding individualized specific dietary modifications aiming towards targeted core components such as weight, hypertension, lipid management, diabetes, heart failure and other comorbidities.;Nutrition handout(s) given to patient.    Expected Outcomes Short Term Goal: Understand basic principles of dietary content, such as calories, fat, sodium, cholesterol and nutrients.;Short Term Goal: A plan has been developed with personal  nutrition goals set during dietitian appointment.;Long Term Goal: Adherence to prescribed nutrition plan.             Nutrition Assessments:  Nutrition Assessments - 09/15/22 1723       Rate Your Plate Scores   Pre Score --            MEDIFICTS Score Key: >=70 Need to make dietary changes  40-70 Heart Healthy Diet <= 40 Therapeutic Level Cholesterol Diet  Flowsheet Row Cardiac Rehab from 09/15/2022 in [facility] Cardiac and Pulmonary Rehab  Picture Your Plate Total Score on Admission 54      Picture Your Plate Scores: <91 Unhealthy dietary pattern with much room for improvement. 41-50 Dietary pattern unlikely to meet recommendations for good health and room for improvement. 51-60 More healthful dietary pattern, with some room for improvement.  >60 Healthy dietary pattern, although there may be some specific behaviors that could be improved.    Nutrition Goals Re-Evaluation:  Nutrition Goals Re-Evaluation     Row Name 10/27/22 1008 11/24/22 0934 12/15/22 0948         Goals   Nutrition Goal Drink more water, ~64oz, less sugary beverages Drink more water, ~64oz, less sugary beverages Drink more water, ~64oz, less sugary beverages     Comment Patient reports that he has cut back on sugary beverages. He usually has 1 soda a day but not he sometimes goes a day or 2 without any. This is the main nutrition goals he has  been working on. He has not yet started to work on his other nutition goals. Patient reports that he is trying to drink more water Patient reports that he has reduced soda to one every few days when he used to drink one soda every day.     Expected Outcome Short: try to be more mindful of eating habits and pairing carbs with protein. Long: establish and maintain heart healthy eating habits. Short: continue to work towards 64 fl oz a day of water intake. Long: be more consious about nutrition choices. Short: continue to work towards eliminating sodas and drinking more  water in place of it. Long: maintain heart healthy diet.       Personal Goal #2 Re-Evaluation   Personal Goal #2 Watch carb portions, pair carbs with protein at meals -- --       Personal Goal #3 Re-Evaluation   Personal Goal #3 Work on building better habits with new routine -- --              Nutrition Goals Discharge (Final Nutrition Goals Re-Evaluation):  Nutrition Goals Re-Evaluation - 12/15/22 0948       Goals   Nutrition Goal Drink more water, ~64oz, less sugary beverages    Comment Patient reports that he has reduced soda to one every few days when he used to drink one soda every day.    Expected Outcome Short: continue to work towards eliminating sodas and drinking more water in place of it. Long: maintain heart healthy diet.             Psychosocial: Target Goals: Acknowledge presence or absence of significant depression and/or stress, maximize coping skills, provide positive support system. Participant is able to verbalize types and ability to use techniques and skills needed for reducing stress and depression.   Education: Stress, Anxiety, and Depression - Group verbal and visual presentation to define topics covered.  Reviews how body is impacted by stress, anxiety, and depression.  Also discusses healthy ways to reduce stress and to treat/manage anxiety and depression.  Written material given at graduation.   Education: Sleep Hygiene -Provides group verbal and written instruction about how sleep can affect your health.  Define sleep hygiene, discuss sleep cycles and impact of sleep habits. Review good sleep hygiene tips.    Initial Review & Psychosocial Screening:  Initial Psych Review & Screening - 09/11/22 1344       Family Dynamics   Good Support System? Yes      Barriers   Psychosocial barriers to participate in program There are no identifiable barriers or psychosocial needs.;The patient should benefit from training in stress management and  relaxation.      Screening Interventions   Interventions Encouraged to exercise;Provide feedback about the scores to participant;To provide support and resources with identified psychosocial needs    Expected Outcomes Long Term Goal: Stressors or current issues are controlled or eliminated.;Short Term goal: Utilizing psychosocial counselor, staff and physician to assist with identification of specific Stressors or current issues interfering with healing process. Setting desired goal for each stressor or current issue identified.;Long Term goal: The participant improves quality of Life and PHQ9 Scores as seen by post scores and/or verbalization of changes;Short Term goal: Identification and review with participant of any Quality of Life or Depression concerns found by scoring the questionnaire.             Quality of Life Scores:   Quality of Life - 09/15/22 1721  Quality of Life   Select Quality of Life      Quality of Life Scores   Health/Function Pre 24.2 %    Socioeconomic Pre 26.29 %    Psych/Spiritual Pre 24.86 %    Family Pre 27.6 %    GLOBAL Pre 25.39 %            Scores of 19 and below usually indicate a poorer quality of life in these areas.  A difference of  2-3 points is a clinically meaningful difference.  A difference of 2-3 points in the total score of the Quality of Life Index has been associated with significant improvement in overall quality of life, self-image, physical symptoms, and general health in studies assessing change in quality of life.  PHQ-9: Review Flowsheet  More data exists      12/09/2022 09/15/2022 12/03/2021 11/23/2020 11/23/2019  Depression screen PHQ 2/9  Decreased Interest 0 0 0 0 0 0  Down, Depressed, Hopeless 0 0 0 0 0 0  PHQ - 2 Score 0 0 0 0 0 0  Altered sleeping - 0 - - -  Tired, decreased energy - 0 - - -  Change in appetite - 0 - - -  Feeling bad or failure about yourself  - 0 - - -  Trouble concentrating - 0 - - -  Moving  slowly or fidgety/restless - 0 - - -  Suicidal thoughts - 0 - - -  PHQ-9 Score - 0 - - -    Details       Multiple values from one day are sorted in reverse-chronological order        Interpretation of Total Score  Total Score Depression Severity:  1-4 = Minimal depression, 5-9 = Mild depression, 10-14 = Moderate depression, 15-19 = Moderately severe depression, 20-27 = Severe depression   Psychosocial Evaluation and Intervention:  Psychosocial Evaluation - 09/11/22 1359       Psychosocial Evaluation & Interventions   Interventions Encouraged to exercise with the program and follow exercise prescription    Comments Bijan is coming to cardiac rehab post stents. He notes that he can tell a positive difference in his stamina, but wants to improve it more. He has had an episode of chest pain which was relieved with rest. He reports having a good support system and has no stress concerns at this time. He wants to come work on his strength and gain more knowledge about living a heart healthy life.    Expected Outcomes Short: attend cardiac rehab for education and exercise. Long: develop and maintain positive self care habits.    Continue Psychosocial Services  Follow up required by staff             Psychosocial Re-Evaluation:  Psychosocial Re-Evaluation     Row Name 10/27/22 1022 11/24/22 0949 12/15/22 0951         Psychosocial Re-Evaluation   Current issues with None Identified None Identified None Identified     Comments Patient reports there have not been any changes to in sleep, stress, or mental health, but that he does not sleep enough. This is his usual but he reports that he is often tired during the day and stays up to late trying to get things done. He was encouraged to try to establish a consistent bedtime routine to help aid in preparing the body for bettter sleep. no changes reported in in stress, sleep, or mental health. Continue to attend cardiac rehab and maintain  good mental health habits. no changes reported in in stress, sleep, or mental health. He does report that some nights he only gets 4 hours of sleep. He always wakes up at the same time so he nees to work on getting to bed earlier if to get more sleep. Continue to attend cardiac rehab and maintain good mental health habits.     Expected Outcomes Short: establish a consistent bedtime routine. Long: maintain good mental health routine. Short: establish a consistent bedtime routine. Long: maintain good mental health routine. Short: establish a consistent bedtime routine. Long: maintain good mental health routine.     Interventions Encouraged to attend Cardiac Rehabilitation for the exercise Encouraged to attend Cardiac Rehabilitation for the exercise Encouraged to attend Cardiac Rehabilitation for the exercise     Continue Psychosocial Services  Follow up required by staff Follow up required by staff Follow up required by staff              Psychosocial Discharge (Final Psychosocial Re-Evaluation):  Psychosocial Re-Evaluation - 12/15/22 0951       Psychosocial Re-Evaluation   Current issues with None Identified    Comments no changes reported in in stress, sleep, or mental health. He does report that some nights he only gets 4 hours of sleep. He always wakes up at the same time so he nees to work on getting to bed earlier if to get more sleep. Continue to attend cardiac rehab and maintain good mental health habits.    Expected Outcomes Short: establish a consistent bedtime routine. Long: maintain good mental health routine.    Interventions Encouraged to attend Cardiac Rehabilitation for the exercise    Continue Psychosocial Services  Follow up required by staff             Vocational Rehabilitation: Provide vocational rehab assistance to qualifying candidates.   Vocational Rehab Evaluation & Intervention:  Vocational Rehab - 09/11/22 1344       Initial Vocational Rehab Evaluation &  Intervention   Assessment shows need for Vocational Rehabilitation No             Education: Education Goals: Education classes will be provided on a variety of topics geared toward better understanding of heart health and risk factor modification. Participant will state understanding/return demonstration of topics presented as noted by education test scores.  Learning Barriers/Preferences:  Learning Barriers/Preferences - 09/11/22 1343       Learning Barriers/Preferences   Learning Barriers None    Learning Preferences Individual Instruction             General Cardiac Education Topics:  AED/CPR: - Group verbal and written instruction with the use of models to demonstrate the basic use of the AED with the basic ABC's of resuscitation.   Anatomy and Cardiac Procedures: - Group verbal and visual presentation and models provide information about basic cardiac anatomy and function. Reviews the testing methods done to diagnose heart disease and the outcomes of the test results. Describes the treatment choices: Medical Management, Angioplasty, or Coronary Bypass Surgery for treating various heart conditions including Myocardial Infarction, Angina, Valve Disease, and Cardiac Arrhythmias.  Written material given at graduation. Flowsheet Row Cardiac Rehab from 09/15/2022 in [facility]  Education need identified 09/15/22       Medication Safety: - Group verbal and visual instruction to review commonly prescribed medications for heart and lung disease. Reviews the medication, class of the drug, and side effects. Includes the steps to properly store  meds and maintain the prescription regimen.  Written material given at graduation.   Intimacy: - Group verbal instruction through game format to discuss how heart and lung disease can affect sexual intimacy. Written material given at graduation..   Know Your Numbers and Heart Failure: - Group verbal and visual  instruction to discuss disease risk factors for cardiac and pulmonary disease and treatment options.  Reviews associated critical values for Overweight/Obesity, Hypertension, Cholesterol, and Diabetes.  Discusses basics of heart failure: signs/symptoms and treatments.  Introduces Heart Failure Zone chart for action plan for heart failure.  Written material given at graduation.   Infection Prevention: - Provides verbal and written material to individual with discussion of infection control including proper hand washing and proper equipment cleaning during exercise session. Flowsheet Row Cardiac Rehab from 09/15/2022 in [facility] Cardiac [facility] Rehab  Date 09/15/22  Educator MB  Instruction Review Code 1- Verbalizes Understanding       Falls Prevention: - Provides verbal and written material to individual with discussion of falls prevention and safety. Flowsheet Row Cardiac Rehab from 09/15/2022 in [facility] Cardiac and Pulmonary Rehab  Date 09/15/22  Educator MB  Instruction Review Code 1- Verbalizes Understanding       Other: -Provides group and verbal instruction on various topics (see comments)   Knowledge Questionnaire Score:  Knowledge Questionnaire Score - 09/15/22 1724       Knowledge Questionnaire Score   Pre Score 23/26             Core Components/Risk Factors/Patient Goals at Admission:  Personal Goals and Risk Factors at Admission - 09/15/22 1725       Core Components/Risk Factors/Patient Goals on Admission    Weight Management Yes    Intervention Weight Management: Provide education and appropriate resources to help participant work on and attain dietary goals.;Weight Management: Develop a combined nutrition and exercise program designed to reach desired caloric intake, while maintaining appropriate intake of nutrient and fiber, sodium and fats, and appropriate energy expenditure required for the weight goal.    Expected Outcomes Weight Maintenance: Understanding  of the daily nutrition guidelines, which includes 25-35% calories from fat, 7% or less cal from saturated fats, less than 200mg  cholesterol, less than 1.5gm of sodium, & 5 or more servings of fruits and vegetables daily;Understanding recommendations for meals to include 15-35% energy as protein, 25-35% energy from fat, 35-60% energy from carbohydrates, less than 200mg  of dietary cholesterol, 20-35 gm of total fiber daily;Understanding of distribution of calorie intake throughout the day with the consumption of 4-5 meals/snacks;Short Term: Continue to assess and modify interventions until short term weight is achieved;Long Term: Adherence to nutrition and physical activity/exercise program aimed toward attainment of established weight goal    Lipids Yes    Intervention Provide education and support for participant on nutrition & aerobic/resistive exercise along with prescribed medications to achieve LDL 70mg , HDL >40mg .    Expected Outcomes Short Term: Participant states understanding of desired cholesterol values and is compliant with medications prescribed. Participant is following exercise prescription and nutrition guidelines.;Long Term: Cholesterol controlled with medications as prescribed, with individualized exercise RX and with personalized nutrition plan. Value goals: LDL < 70mg , HDL > 40 mg.             Education:Diabetes - Individual verbal and written instruction to review signs/symptoms of diabetes, desired ranges of glucose level fasting, after meals and with exercise. Acknowledge that pre and post exercise glucose checks will be done for 3 sessions at entry  of program.   Core Components/Risk Factors/Patient Goals Review:   Goals and Risk Factor Review     Row Name 10/27/22 1013 11/24/22 0939 12/15/22 0949         Core Components/Risk Factors/Patient Goals Review   Personal Goals Review Weight Management/Obesity;Lipids Lipids;Weight Management/Obesity Lipids;Weight  Management/Obesity     Review Patient reports that he takes all his meds without any probelems and follows up with MD any concerns arise. His weight has been maintained. Patient reports that he takes all meds and follows up with his doctor with appropriate blood work to manage cholesterol and lipids. His weight is fairly steady and he continues to monitor his weight. Patient reports his weight has been steady. He also continues to take all cholesterol meds as prescirbed. He just recently went to his doctor for lab work and all his levels were within acceptable ranges. He will continue to follow up with his doctor to monitor his cholesterol and lipid levels.     Expected Outcomes Short: continue to attend cardiac rehab for risk factor reduction. Continue to take all meds and manage cholesterol with guidance from MD. Long: maintain healthy weight and control cardiac risk factos. Short: continue to attend cardiac rehab classes and take meds as prescribed. Long: continue to control cardiac risk factors with healthy lifestyle choices and medication management. Short: continue to attend cardiac rehab classes and take meds as prescribed. Long: continue to control cardiac risk factors with healthy lifestyle choices and medication management.              Core Components/Risk Factors/Patient Goals at Discharge (Final Review):   Goals and Risk Factor Review - 12/15/22 0949       Core Components/Risk Factors/Patient Goals Review   Personal Goals Review Lipids;Weight Management/Obesity    Review Patient reports his weight has been steady. He also continues to take all cholesterol meds as prescirbed. He just recently went to his doctor for lab work and all his levels were within acceptable ranges. He will continue to follow up with his doctor to monitor his cholesterol and lipid levels.    Expected Outcomes Short: continue to attend cardiac rehab classes and take meds as prescribed. Long: continue to control  cardiac risk factors with healthy lifestyle choices and medication management.             ITP Comments:  ITP Comments     Row Name 09/11/22 1341 09/15/22 1734 09/22/22 1054 10/01/22 1038 10/29/22 1213   ITP Comments Initial phone call completed. Diagnosis can be found in [facility] 7/25. EP Orientation scheduled for Monday 8/19 at 2:30. Completed and gym orientation. Initial ITP created and sent for review to Dr. Bethann Punches, Medical Director. First full day of exercise!  Patient was oriented to gym and equipment including functions, settings, policies, and procedures.  Patient's individual exercise prescription and treatment plan were reviewed.  All starting workloads were established based on the results of the 6 minute walk test done at initial orientation visit.  The plan for exercise progression was also introduced and progression will be customized based on patient's performance and goals. 30 Day review completed. Medical Director ITP review done, changes made as directed, and signed approval by Medical Director.     new to program 30 Day review completed. Medical Director ITP review done, changes made as directed, and signed approval by Medical Director.    Row Name 11/26/22 1303 12/17/22 1152 01/14/23 1013       ITP Comments  30 Day review completed. Medical Director ITP review done, changes made as directed, and signed approval by Medical Director. 30 Day review completed. Medical Director ITP review done, changes made as directed, and signed approval by Medical Director. 30 Day review completed. Medical Director ITP review done, changes made as directed, and signed approval by Medical Director.              Comments:

## 2023-01-17 ENCOUNTER — Ambulatory Visit
Admission: EM | Admit: 2023-01-17 | Discharge: 2023-01-17 | Disposition: A | Discharge status: Home / Self Care | Payer: Medicare Other | Attending: Emergency Medicine | Admitting: Emergency Medicine

## 2023-01-17 ENCOUNTER — Ambulatory Visit (INDEPENDENT_AMBULATORY_CARE_PROVIDER_SITE_OTHER): Payer: Medicare Other

## 2023-01-17 DIAGNOSIS — R0602 Shortness of breath: Secondary | ICD-10-CM

## 2023-01-17 DIAGNOSIS — R051 Acute cough: Secondary | ICD-10-CM

## 2023-01-17 NOTE — ED Triage Notes (Signed)
Pt present with a cough

## 2023-01-17 NOTE — Discharge Instructions (Addendum)
X-ray results pending, as you have recently completed 2 rounds of antibiotics and are currently taking steroids we will base further treatment on the test results  You will be called via telephone today if resulting before 4 PM, if test comes back after 4 PM you will be called in the morning

## 2023-01-18 ENCOUNTER — Telehealth: Payer: Self-pay | Admitting: Emergency Medicine

## 2023-01-18 MED ORDER — ALBUTEROL SULFATE HFA 108 (90 BASE) MCG/ACT IN AERS: 2.0000 | INHALATION_SPRAY | Freq: Four times a day (QID) | RESPIRATORY_TRACT | 2 refills | Status: DC | PRN

## 2023-01-18 NOTE — Telephone Encounter (Signed)
Reviewed chest x-ray via telephone, 2 patient identifiers used, discussed with patient continued use of antibiotics most likely will not be effective as he is already completed 2 rounds with minimal improvement of the symptoms, will finish steroid course tomorrow, with minimal improvement, at this time will defer further usage of antibiotics and albuterol inhaler prescribed with strongly advised follow-up with PCP for further evaluation

## 2023-01-18 NOTE — ED Provider Notes (Signed)
Renaldo Fiddler    CSN: [id number] Arrival date & time: 01/17/23  1332      History   Chief Complaint Chief Complaint  Patient presents with   Cough    HPI Willie Griffin is a 67 y.o. male.   Patient presents for evaluation of worsening cough over the past 3 to 4 weeks.  Has begun to experience chest tightness and shortness of breath with exertion and coughing.  Currently taking azithromycin and will complete prednisone course tomorrow, additionally has completed doxycycline and used prescription cough medicine.  Did see small improvement after completion of Doxy.  Denies wheezing.  Past Medical History:  Diagnosis Date   Allergy    allergic rhinitis   GERD (gastroesophageal reflux disease)    Hemorrhoid    Hypothyroidism    Peyronie disease     Patient Active Problem List   Diagnosis Date Noted   Hyperlipidemia LDL goal <70 08/22/2022   Stable angina (HCC) 08/21/2022   Abnormal cardiac CT angiography 08/21/2022   Coronary artery disease of native artery of native heart with stable angina pectoris (HCC) 08/21/2022   Coronary artery calcification 11/23/2020   DOE (dyspnea on exertion) 11/23/2019   Spondylosis of lumbosacral region without myelopathy or radiculopathy 01/14/2018   GERD (gastroesophageal reflux disease) 10/20/2017   Chronic back pain 03/17/2017   Hypothyroidism    Lumbar radiculopathy 04/24/2015   Rosacea 10/21/2013   Routine general medical examination at a health care facility 08/09/2010   Allergic rhinitis 03/17/2007   Peyronie's disease 02/19/2007    Past Surgical History:  Procedure Laterality Date   COLONOSCOPY     CORONARY STENT INTERVENTION N/A 08/21/2022   Procedure: CORONARY STENT INTERVENTION;  Surgeon: Iran Ouch, MD;  Location: [facility] INVASIVE CV [facility];  Service: Cardiovascular;  Laterality: N/A;   INGUINAL HERNIA REPAIR  9/05   LEFT HEART CATH AND CORONARY ANGIOGRAPHY Left 08/21/2022   Procedure: LEFT HEART CATH AND CORONARY  ANGIOGRAPHY;  Surgeon: Iran Ouch, MD;  Location: [facility] INVASIVE CV LAB;  Service: Cardiovascular;  Laterality: Left;   MOHS SURGERY     nasal SCC----[facility]   TOENAIL EXCISION     partial   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 Authorizing Provider  aspirin EC 81 MG tablet Take 1 tablet (81 mg total) by mouth daily. Swallow whole. 02/28/20  Yes Debbe Odea, MD  clopidogrel (PLAVIX) 75 MG tablet Take 1 tablet (75 mg total) by mouth daily with breakfast. 08/22/22  Yes Dunn, Raymon Mutton, PA-C  Cyanocobalamin (VITAMIN B-12 PO) Take 2 Doses by mouth daily.   Yes [provider]  predniSONE (STERAPRED UNI-PAK 21 TAB) 10 MG (21) TBPK tablet Take by mouth daily. Take 6 tabs by mouth daily  for 1 days, then 5 tabs for 1 days, then 4 tabs for 1 days, then 3 tabs for 1 days, 2 tabs for 1 days, then 1 tab by mouth daily for 1 days 01/13/23  Yes Yaneliz Radebaugh R, NP  SOOLANTRA 1 % CREA Apply topically daily. to face 01/15/17  Yes [provider]  albuterol (VENTOLIN HFA) 108 (90 Base) MCG/ACT inhaler Inhale 2 puffs into the lungs every 6 (six) hours as needed for wheezing or shortness of breath. 01/18/23   Valinda Hoar, NP  atorvastatin (LIPITOR) 20 MG tablet Take 1  tablet (20 mg total) by mouth daily. 08/15/22 12/28/22  Debbe Odea, MD  azithromycin (ZITHROMAX) 250 MG tablet Take 1 tablet (250 mg total) by mouth daily. Take first 2 tablets together, then 1 every day until finished. 01/13/23   Araseli Sherry, Elita Boone, NP  benzonatate (TESSALON) 100 MG capsule Take 1 capsule (100 mg total) by mouth every 8 (eight) hours. 12/28/22   Raspet, Noberto Retort, PA-C  doxycycline (VIBRAMYCIN) 100 MG capsule Take 1 capsule (100 mg total) by mouth 2 (two) times daily. Patient not taking: Reported on 01/13/2023 12/28/22   Raspet, Erin K, PA-C  EPIPEN 2-PAK 0.3 MG/0.3ML SOAJ injection  INJECT 0.3 MG INTRAMUSCULARLY ONCE AS NEEDED FOR UP TO 1 DOSE 12/03/21   Tillman Abide I, MD  hydrocortisone 2.5 % cream Use rectally twice daily as neeed 12/09/22   Karie Schwalbe, MD  levothyroxine (SYNTHROID) 25 MCG tablet TAKE 1 TABLET BY MOUTH EVERY DAY BEFORE BREAKFAST 09/18/22   Karie Schwalbe, MD  OVER THE COUNTER MEDICATION Take 2 capsules by mouth daily. Synoviox includes MSM, turmeric, and hyaluronic acid    [provider]  pantoprazole (PROTONIX) 40 MG tablet Take 1 tablet (40 mg total) by mouth daily. 08/22/22   Sondra Barges, PA-C  promethazine-dextromethorphan (PROMETHAZINE-DM) 6.25-15 MG/5ML syrup Take 5 mLs by mouth 4 (four) times daily as needed for cough. 01/13/23   Rockney Grenz, Elita Boone, NP  tadalafil (CIALIS) 20 MG tablet Take 0.5-1 tablets (10-20 mg total) by mouth every other day as needed for erectile dysfunction. 10/07/22   Karie Schwalbe, MD    Family History Family History  Problem Relation Age of Onset   Arthritis Mother    Heart attack Mother    Heart attack Father    Dementia Father        Lewy body dementia   Heart disease Father        stent x 11    Hodgkin's lymphoma Sister    Cancer Sister    Other Sister        precancerous polyps   Cancer Paternal Aunt        jaw ca   Celiac disease Paternal Uncle    Other Paternal Uncle        mesothelioma   Stroke Maternal Grandmother    Coronary artery disease Maternal Grandfather    Suicidality Paternal Grandmother    Breast cancer Paternal Grandmother    Coronary artery disease Paternal Grandfather    Breast cancer Other    Diabetes Neg Hx    Hypertension Neg Hx    Colon polyps Neg Hx    Colon cancer Neg Hx    Rectal cancer Neg Hx    Stomach cancer Neg Hx    Esophageal cancer Neg Hx     Social History Social History   Tobacco Use   Smoking status: Never    Passive exposure: Past   Smokeless tobacco: Never  Vaping Use   Vaping status: Never Used  Substance Use Topics   Alcohol  use: No   Drug use: No     Allergies   Bee venom, Vitamin e, and Ciprofloxacin   Review of Systems Review of Systems  Respiratory:  Positive for cough.      Physical Exam Triage Vital Signs ED Triage Vitals [01/17/23 1420]  Encounter Vitals Group     BP 139/73     Systolic BP Percentile      Diastolic BP Percentile      Pulse  Rate 67     Resp 20     Temp 99.1 F (37.3 C)     Temp src      SpO2 97 %     Weight      Height      Head Circumference      Peak Flow      Pain Score 0     Pain Loc      Pain Education      Exclude from Growth Chart    No data found.  Updated Vital Signs BP 139/73   Pulse 67   Temp 99.1 F (37.3 C)   Resp 20   SpO2 97%   Visual Acuity Right Eye Distance:   Left Eye Distance:   Bilateral Distance:    Right Eye Near:   Left Eye Near:    Bilateral Near:     Physical Exam Constitutional:      Appearance: Normal appearance.  Eyes:     Extraocular Movements: Extraocular movements intact.  Cardiovascular:     Rate and Rhythm: Normal rate and regular rhythm.     Pulses: Normal pulses.     Heart sounds: Normal heart sounds.  Pulmonary:     Comments: Congestion present to the right upper lobe, remaining lobes clear Neurological:     Mental Status: He is alert and oriented to person, place, and time. Mental status is at baseline.      UC Treatments / Results  Labs (all labs ordered are listed, but only abnormal results are displayed) Labs Reviewed - No data to display  EKG   Radiology DG Chest 2 View Result Date: 01/17/2023 CLINICAL DATA:  Shortness of breath with wheezing and cough. EXAM: CHEST - 2 VIEW COMPARISON:  01/13/2023 FINDINGS: The lungs are clear without focal pneumonia, edema, pneumothorax or pleural effusion. The cardiopericardial silhouette is within normal limits for size. No acute bony abnormality. IMPRESSION: No active cardiopulmonary disease. Electronically Signed   By: Kennith Center M.D.   On:  01/17/2023 14:39    Procedures Procedures (including critical care time)  Medications Ordered in UC Medications - No data to display  Initial Impression / Assessment and Plan / UC Course  I have reviewed the triage vital signs and the nursing notes.  Pertinent labs & imaging results that were available during my care of the patient were reviewed by me and considered in my medical decision making (see chart for details).  Shortness of breath, acute cough  X-ray pending, will make adjustments to the treatment plan based on test results as patient is currently on antibiotics and steroids Final Clinical Impressions(s) / UC Diagnoses   Final diagnoses:  SOB (shortness of breath)  Acute cough     Discharge Instructions      X-ray results pending, as you have recently completed 2 rounds of antibiotics and are currently taking steroids we will base further treatment on the test results  You will be called via telephone today if resulting before 4 PM, if test comes back after 4 PM you will be called in the morning   ED Prescriptions   None    PDMP not reviewed this encounter.   Valinda Hoar, [place] 01/18/23 [phone]

## 2023-01-19 ENCOUNTER — Ambulatory Visit: Payer: Medicare Other | Admitting: *Deleted

## 2023-01-23 ENCOUNTER — Encounter: Payer: Self-pay | Admitting: Family

## 2023-01-23 ENCOUNTER — Encounter: Payer: Self-pay | Admitting: Internal Medicine

## 2023-01-23 ENCOUNTER — Ambulatory Visit: Payer: Medicare Other

## 2023-01-23 ENCOUNTER — Ambulatory Visit (INDEPENDENT_AMBULATORY_CARE_PROVIDER_SITE_OTHER): Payer: Medicare Other | Admitting: Family

## 2023-01-23 VITALS — BP 120/66 | HR 75 | Temp 98.4°F | Resp 16 | Ht 68.0 in | Wt 178.0 lb

## 2023-01-23 DIAGNOSIS — J189 Pneumonia, unspecified organism: Secondary | ICD-10-CM

## 2023-01-23 MED ORDER — DOXYCYCLINE HYCLATE 100 MG PO TABS
100.0000 mg | ORAL_TABLET | Freq: Two times a day (BID) | ORAL | 0 refills | Status: DC
Start: 2023-01-23 — End: 2023-02-09

## 2023-01-23 MED ORDER — PREDNISONE 20 MG PO TABS
ORAL_TABLET | ORAL | 0 refills | Status: DC
Start: 2023-01-23 — End: 2023-02-09

## 2023-01-23 MED ORDER — AMOXICILLIN-POT CLAVULANATE 875-125 MG PO TABS: 1.0000 | ORAL_TABLET | Freq: Two times a day (BID) | ORAL | 0 refills | Status: DC

## 2023-01-23 NOTE — Assessment & Plan Note (Signed)
Start doxycycline 100 mg po bid x 10 days along with augmentin 875/125 mg po bid x 10 days. Advised to take probiotic and monitor for s/s CDIFF. Will hold on CT for now, suspected residual pneumonia, which advised pt can last up to six weeks with cough. Advised pt if fever, sob or DOE to please let us know immediately.

## 2023-01-23 NOTE — Progress Notes (Signed)
Established Patient Office Visit  Subjective:      CC:  Chief Complaint  Patient presents with   Cough    5 weeks   Nasal Congestion    5 weeks    HPI: Willie Griffin is a 67 y.o. male presenting on 01/23/2023 for Cough (5 weeks) and Nasal Congestion (5 weeks) .  Went to [place] urgent care 12/1 for cough and chest congestion.  Was given doxycycline 100 mg po bid x 10 days. Went back to urgent care 12/17 was given promethazine DM , CXR was without acute findings. Then again came back 12/21 due to worsening cough as well as chest tightness and sob with exertion. Was given zpack and prednisone course and albuterol inhaler prn, has used it on and off without any change. 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 No sob or doe. Appetite is good  Today with cough, nonproductive. States hard for him to get anything up. He doesn't note any wheezing but when exhales hears a 'rattle'. He does not note any acid reflux at current. He states ongoing nasal congestion and rhinorrhea, ears are stopped up. When he blows his nose the ears will pop. For allergies he does not currently take any medications.   Is on pantoprazole 40 mg once daily.  CTA 08/14/22 unremarkable.  Pt is a non smoker.       Social history:  Relevant past medical, surgical, family and social history reviewed and updated as indicated. Interim medical history since our last visit reviewed.  Allergies and medications reviewed and updated.  DATA REVIEWED: CHART IN EPIC     ROS: Negative unless specifically indicated above in HPI.    Current Outpatient Medications:    amoxicillin-clavulanate (AUGMENTIN) 875-125 MG tablet, Take 1 tablet by mouth 2 (two) times daily., Disp: 20 tablet, Rfl: 0   doxycycline (VIBRA-TABS) 100 MG tablet, Take 1 tablet (100 mg total) by mouth 2 (two) times daily., Disp: 14 tablet, Rfl: 0   predniSONE (DELTASONE) 20 MG tablet, Take two tablets once daily for five  days, Disp: 10 tablet, Rfl: 0   albuterol (VENTOLIN HFA) 108 (90 Base) MCG/ACT inhaler, Inhale 2 puffs into the lungs every 6 (six) hours as needed for wheezing or shortness of breath., Disp: 8 g, Rfl: 2   aspirin EC 81 MG tablet, Take 1 tablet (81 mg total) by mouth daily. Swallow whole., Disp: 90 tablet, Rfl: 3   atorvastatin (LIPITOR) 20 MG tablet, Take 1 tablet (20 mg total) by mouth daily., Disp: 90 tablet, Rfl: 3   clopidogrel (PLAVIX) 75 MG tablet, Take 1 tablet (75 mg total) by mouth daily with breakfast., Disp: 30 tablet, Rfl: 11   Cyanocobalamin (VITAMIN B-12 PO), Take 2 Doses by mouth daily., Disp: , Rfl:    EPIPEN 2-PAK 0.3 MG/0.3ML SOAJ injection, INJECT 0.3 MG INTRAMUSCULARLY ONCE AS NEEDED FOR UP TO 1 DOSE, Disp: 2 each, Rfl: 1   hydrocortisone 2.5 % cream, Use rectally twice daily as neeed, Disp: 30 g, Rfl: 5   levothyroxine (SYNTHROID) 25 MCG tablet, TAKE 1 TABLET BY MOUTH EVERY DAY BEFORE BREAKFAST, Disp: 90 tablet, Rfl: 3   OVER THE COUNTER MEDICATION, Take 2 capsules by mouth daily. Synoviox includes MSM, turmeric, and hyaluronic acid, Disp: , Rfl:    pantoprazole (PROTONIX) 40 MG tablet, Take 1 tablet (40 mg total) by mouth daily., Disp: 30 tablet, Rfl: 5   SOOLANTRA 1 % CREA, Apply topically daily. to  face, Disp: , Rfl: 5   tadalafil (CIALIS) 20 MG tablet, Take 0.5-1 tablets (10-20 mg total) by mouth every other day as needed for erectile dysfunction., Disp: 10 tablet, Rfl: 11      Objective:    BP 120/66   Pulse 75   Temp 98.4 F (36.9 C)   Resp 16   Ht 5\' 8"  (1.727 m)   Wt 178 lb (80.7 kg)   SpO2 97%   BMI 27.06 kg/m   Wt Readings from Last 3 Encounters:  01/23/23 178 lb (80.7 kg)  12/19/22 173 lb 4.8 oz (78.6 kg)  12/09/22 172 lb (78 kg)          Physical Exam Vitals reviewed.  Constitutional:      General: He is not in acute distress.    Appearance: Normal appearance. He is obese. He is not ill-appearing, toxic-appearing or diaphoretic.  HENT:      Head: Normocephalic.     Right Ear: Tympanic membrane normal.     Left Ear: Tympanic membrane normal.     Nose: Nose normal.     Mouth/Throat:     Mouth: Mucous membranes are moist.  Eyes:     Pupils: Pupils are equal, round, and reactive to light.  Cardiovascular:     Rate and Rhythm: Normal rate and regular rhythm.  Pulmonary:     Effort: Pulmonary effort is normal.     Breath sounds: Examination of the right-lower field reveals wheezing and rhonchi. Wheezing and rhonchi present.     Comments: Expiratory wheeze Musculoskeletal:        General: Normal range of motion.     Cervical back: Normal range of motion.  Neurological:     General: No focal deficit present.     Mental Status: He is alert and oriented to person, place, and time. Mental status is at baseline.  Psychiatric:        Mood and Affect: Mood normal.        Behavior: Behavior normal.        Thought Content: Thought content normal.        Judgment: Judgment normal.           Assessment & Plan:  Community acquired pneumonia of right lower lobe of lung Assessment & Plan: Start doxycycline 100 mg po bid x 10 days along with augmentin 875/125 mg po bid x 10 days. Advised to take probiotic and monitor for s/s CDIFF. Will hold on CT for now, suspected residual pneumonia, which advised pt can last up to six weeks with cough. Advised pt if fever, sob or DOE to please let us know immediately.    Orders: -     Doxycycline Hyclate; Take 1 tablet (100 mg total) by mouth 2 (two) times daily.  Dispense: 14 tablet; Refill: 0 -     Amoxicillin-Pot Clavulanate; Take 1 tablet by mouth 2 (two) times daily.  Dispense: 20 tablet; Refill: 0 -     predniSONE; Take two tablets once daily for five days  Dispense: 10 tablet; Refill: 0     Return in about 2 weeks (around 02/06/2023).  Mort Sawyers, MSN, APRN, FNP-C [facility]

## 2023-01-26 ENCOUNTER — Ambulatory Visit: Payer: Medicare Other

## 2023-01-30 ENCOUNTER — Ambulatory Visit: Payer: Medicare Other

## 2023-02-02 ENCOUNTER — Ambulatory Visit: Payer: Medicare Other | Admitting: *Deleted

## 2023-02-04 ENCOUNTER — Encounter: Payer: Medicare Other | Attending: Cardiovascular Disease | Admitting: *Deleted

## 2023-02-04 DIAGNOSIS — Z955 Presence of coronary angioplasty implant and graft: Secondary | ICD-10-CM | POA: Diagnosis present

## 2023-02-04 NOTE — Progress Notes (Signed)
 Daily Session Note  Patient Details  Name: Willie Griffin MRN: [id number] Date of Birth: 21-Dec-1955 Referring Provider:   Flowsheet Row Cardiac Rehab from 09/15/2022 in [facility]  Referring Provider Dr.Arida       Encounter Date: 02/04/2023  Check In:  Session Check In - 02/04/23 0933       Check-In   Supervising physician immediately available to respond to emergencies See telemetry face sheet for immediately available ER MD    Location [facility]-Cardiac & Pulmonary Rehab    Staff Present Willie Griffin, BS, Exercise Physiologist;Willie Bice, RN, BSN, CCRP;Willie Griffin, Willie HARMAN Willie Claudene, RN, [place]    Virtual Visit No    Medication changes reported     No    Fall or balance concerns reported    No    Warm-up and Cool-down Performed on first and last piece of equipment    Resistance Training Performed Yes    VAD Patient? No    PAD/SET Patient? No      Pain Assessment   Currently in Pain? No/denies                Social History   Tobacco Use  Smoking Status Never   Passive exposure: Past  Smokeless Tobacco Never    Goals Met:  Independence with exercise equipment Exercise tolerated well No report of concerns or symptoms today Strength training completed today  Goals Unmet:  Not Applicable  Comments: Pt able to follow exercise prescription today without complaint.  Will continue to monitor for progression.    Dr. Oneil Pinal is Medical Director for [facility].  Dr. Fuad Aleskerov is Medical Director for [facility]monary [facility].

## 2023-02-06 ENCOUNTER — Ambulatory Visit: Payer: Medicare Other | Admitting: Internal Medicine

## 2023-02-09 ENCOUNTER — Ambulatory Visit (INDEPENDENT_AMBULATORY_CARE_PROVIDER_SITE_OTHER): Payer: Medicare Other | Admitting: Internal Medicine

## 2023-02-09 ENCOUNTER — Telehealth: Payer: Self-pay | Admitting: Cardiology

## 2023-02-09 ENCOUNTER — Encounter: Payer: Self-pay | Admitting: Internal Medicine

## 2023-02-09 VITALS — BP 122/80 | HR 74 | Temp 98.5°F | Ht 68.0 in | Wt 179.0 lb

## 2023-02-09 DIAGNOSIS — J189 Pneumonia, unspecified organism: Secondary | ICD-10-CM | POA: Diagnosis not present

## 2023-02-09 NOTE — Telephone Encounter (Signed)
 I s/w the pt about pre op clearance tele appt needed. I asked pt if he has been instructed yet to hold ASA and Plavix . Pt said no that the surgeon office said they were waiting for cardiology. I informed the pt that we just got the request this afternoon for surgery Friday 02/13/23.   I informed the pt of the recommendations from the preop APP: This patient is scheduled for surgery on the 02/13/2023. He will need VV He is on Plavix  which needs to be held for 5 days and ASA which needs to be held for 7 days. Unless his surgeon has already told him to hold the medications, he will have to be postponed. IF he has he needs VV.    SEE THE NOTES ABOVE IN BOLD Pt has been added on tele preop appt 02/10/23 @ 3:40. Pt did say he took both ASA and Plavix  this morning. I stated if he holds ASA and Plavix  as of now, we can do the tele appt tomorrow. Though cannot be sure that Dr. Francisco with be agreeable to only a 3 day hold for both ASA and Plavix . Pt asked why did the surgeon office wait until now to ask for clearance. I explained that is a question he needs to ask the surgeon himself.   I did also leave a message for the surgeon's office that we only received the request today. Though we will do what we can to help accommodate the pt and hope to not cause a delay for the surgery.   I will update all parties involved. Ok per Lamarr Satterfield, DNP ok to add on by or before 02/11/23.

## 2023-02-09 NOTE — Telephone Encounter (Signed)
   Name: Willie Griffin  DOB: [birth year]  MRN: [id number]  Primary Cardiologist: Redell Cave, MD   Preoperative team, please contact this patient and set up a phone call appointment for further preoperative risk assessment. Please obtain consent and complete medication review. Surgery is on 02/13/2023. Thank you for your help.  I confirm that guidance regarding antiplatelet and oral anticoagulation therapy has been completed and, if necessary, noted below.He is on Plavix  and ASA but this was not requested to be held prior to the surgery.    I also confirmed the patient resides in the state of [place] . As per [facility] Medical Board telemedicine laws, the patient must reside in the state in which the provider is licensed.   Lamarr Satterfield, NP 02/09/2023, 4:06 PM [facility]

## 2023-02-09 NOTE — Assessment & Plan Note (Signed)
 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

## 2023-02-09 NOTE — Telephone Encounter (Signed)
   Pre-operative Risk Assessment    Patient Name: Willie Griffin  DOB: [birth year] MRN: [id number]   Date of last office visit: 10/09/22 Date of next office visit: 04/13/23   Request for Surgical Clearance    Procedure:   Left open carpal tunnel release  Date of Surgery:  Clearance 02/13/23                                Surgeon:  Dr Hardin Czar Surgeon's Group or Practice Name:  [facility] Phone number:  [phone] Fax number:  [phone]   Type of Clearance Requested:   - Medical    Type of Anesthesia:  Not Indicated   Additional requests/questions:    Bonney Rosina Stamps   02/09/2023, 2:40 PM

## 2023-02-09 NOTE — Progress Notes (Signed)
 Subjective:    Patient ID: Willie Griffin, male    DOB: [birth year], 68 y.o.   MRN: [id number]  HPI Here for follow up of apparent atypical pneumonia  Started about 6 weeks ago Seemed to start a few days after getting pneumonia shot Started with cough and congestion Sig SOB--and still gets winded with minimal activity Starting to bring up some mucus at times No striking fever No chills, sweats or myalgias Some ear pain--did see some blood on Q-tip and in nasal drainage No swollen glands  Current Outpatient Medications on File Prior to Visit  Medication Sig Dispense Refill   albuterol  (VENTOLIN  HFA) 108 (90 Base) MCG/ACT inhaler Inhale 2 puffs into the lungs every 6 (six) hours as needed for wheezing or shortness of breath. 8 g 2   aspirin  EC 81 MG tablet Take 1 tablet (81 mg total) by mouth daily. Swallow whole. 90 tablet 3   clopidogrel  (PLAVIX ) 75 MG tablet Take 1 tablet (75 mg total) by mouth daily with breakfast. 30 tablet 11   Cyanocobalamin  (VITAMIN B-12 PO) Take 2 Doses by mouth daily.     EPIPEN  2-PAK 0.3 MG/0.3ML SOAJ injection INJECT 0.3 MG INTRAMUSCULARLY ONCE AS NEEDED FOR UP TO 1 DOSE 2 each 1   hydrocortisone  2.5 % cream Use rectally twice daily as neeed 30 g 5   levothyroxine  (SYNTHROID ) 25 MCG tablet TAKE 1 TABLET BY MOUTH EVERY DAY BEFORE BREAKFAST 90 tablet 3   OVER THE COUNTER MEDICATION Take 2 capsules by mouth daily. Synoviox includes MSM, turmeric, and hyaluronic acid     pantoprazole  (PROTONIX ) 40 MG tablet Take 1 tablet (40 mg total) by mouth daily. 30 tablet 5   SOOLANTRA 1 % CREA Apply topically daily. to face  5   tadalafil  (CIALIS ) 20 MG tablet Take 0.5-1 tablets (10-20 mg total) by mouth every other day as needed for erectile dysfunction. 10 tablet 11   atorvastatin  (LIPITOR) 20 MG tablet Take 1 tablet (20 mg total) by mouth daily. 90 tablet 3   No current facility-administered medications on file prior to visit.    Allergies  Allergen Reactions    Bee Venom Other (See Comments)   Vitamin E Other (See Comments)   Ciprofloxacin Other (See Comments)    Sensations like hot and cold are markedly pronounced and dermatologist told him he may be allergic.    Past Medical History:  Diagnosis Date   Allergy    allergic rhinitis   GERD (gastroesophageal reflux disease)    Hemorrhoid    Hypothyroidism    Peyronie disease     Past Surgical History:  Procedure Laterality Date   COLONOSCOPY     CORONARY STENT INTERVENTION N/A 08/21/2022   Procedure: CORONARY STENT INTERVENTION;  Surgeon: Darron Deatrice LABOR, MD;  Location: [facility];  Service: Cardiovascular;  Laterality: N/A;   INGUINAL HERNIA REPAIR  9/05   LEFT HEART CATH AND CORONARY ANGIOGRAPHY Left 08/21/2022   Procedure: LEFT HEART CATH AND CORONARY ANGIOGRAPHY;  Surgeon: Darron Deatrice LABOR, MD;  Location: [facility];  Service: Cardiovascular;  Laterality: Left;   MOHS SURGERY     nasal SCC----[facility]   TOENAIL EXCISION     partial   TONSILLECTOMY  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      Family History  Problem Relation Age of Onset   Arthritis Mother    Heart attack Mother    Heart attack Father    Dementia Father  Lewy body dementia   Heart disease Father        stent x 11    Hodgkin's lymphoma Sister    Cancer Sister    Other Sister        precancerous polyps   Cancer Paternal Aunt        jaw ca   Celiac disease Paternal Uncle    Other Paternal Uncle        mesothelioma   Stroke Maternal Grandmother    Coronary artery disease Maternal Grandfather    Suicidality Paternal Grandmother    Breast cancer Paternal Grandmother    Coronary artery disease Paternal Grandfather    Breast cancer Other    Diabetes Neg Hx    Hypertension Neg Hx    Colon polyps Neg Hx    Colon cancer Neg Hx    Rectal cancer Neg Hx    Stomach cancer Neg Hx    Esophageal cancer Neg Hx     Social History   Socioeconomic History   Marital status: Divorced     Spouse name: Not on file   Number of children: 2   Years of education: Not on file   Highest education level: Not on file  Occupational History   Occupation: Merchandiser, 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 No pets inside. Outdoor cats.      No living will   Not sure about health care POA--probably children   Would want resuscitation attempts   Would accept tube feeds--but not if cognitively unaware   Social Drivers of Health   Financial Resource Strain: Patient Declined (02/03/2023)   Overall Financial Resource Strain (CARDIA)    Difficulty of Paying Living Expenses: Patient declined  Food Insecurity: Patient Declined (02/03/2023)   Hunger Vital Sign    Worried About Running Out of Food in the Last Year: Patient declined    Ran Out of Food in the Last Year: Patient declined  Transportation Needs: Patient Declined (02/03/2023)   PRAPARE - Administrator, Civil Service (Medical): Patient declined    Lack of Transportation (Non-Medical): Patient declined  Physical Activity: Unknown (02/03/2023)   Exercise Vital Sign    Days of Exercise per Week: Patient declined    Minutes of Exercise per Session: Not on file  Stress: Patient Declined (02/03/2023)   [employer] of Occupational Health - Occupational Stress Questionnaire    Feeling of Stress : Patient declined  Social Connections: Unknown (02/03/2023)   Social Connection and Isolation Panel [NHANES]    Frequency of Communication with Friends and Family: Patient declined    Frequency of Social Gatherings with Friends and  Family: Patient declined    Attends Religious Services: Patient declined    Active Member of Clubs or Organizations: Patient declined    Attends Banker Meetings: Not on file    Marital Status: Patient declined  Intimate Partner Violence: Not At Risk (08/21/2022)   Humiliation, Afraid, Rape, and Kick questionnaire    Fear  of Current or Ex-Partner: No    Emotionally Abused: No    Physically Abused: No    Sexually Abused: No   Review of Systems Able to sleep Eating okay Has continued working--driving school bus     Objective:   Physical Exam Constitutional:      Appearance: Normal appearance.  HENT:     Right Ear: Tympanic membrane and ear canal normal.     Left Ear: Tympanic membrane and ear canal normal.     Mouth/Throat:     Pharynx: No oropharyngeal exudate or posterior oropharyngeal erythema.  Pulmonary:     Effort: Pulmonary effort is normal.     Breath sounds: Normal breath sounds. No wheezing or rales.  Musculoskeletal:     Cervical back: Neck supple.  Lymphadenopathy:     Cervical: No cervical adenopathy.  Neurological:     Mental Status: He is alert.            Assessment & Plan:

## 2023-02-10 ENCOUNTER — Ambulatory Visit: Payer: Medicare Other | Attending: Cardiology

## 2023-02-10 DIAGNOSIS — Z01810 Encounter for preprocedural cardiovascular examination: Secondary | ICD-10-CM | POA: Diagnosis not present

## 2023-02-10 DIAGNOSIS — Z01818 Encounter for other preprocedural examination: Secondary | ICD-10-CM

## 2023-02-10 NOTE — Progress Notes (Signed)
 Virtual Visit via Telephone Note   Because of Willie Griffin's co-morbid illnesses, he is at least at moderate risk for complications without adequate follow up.  This format is felt to be most appropriate for this patient at this time.  The patient did not have access to video technology/had technical difficulties with video requiring transitioning to audio format only (telephone).  All issues noted in this document were discussed and addressed.  No physical exam could be performed with this format.  Please refer to the patient's chart for his consent to telehealth for [facility].  Evaluation Performed:  Preoperative cardiovascular risk assessment _____________   Date:  02/10/2023   Patient ID:  Willie Griffin, DOB 1955-10-11, MRN [id number] Patient Location:  Home Provider location:   Office  Primary Care Provider:  Jimmy Charlie FERNS, MD Primary Cardiologist:  Redell Cave, MD  Chief Complaint / Patient Profile   68 y.o. y/o male with a h/o coronary artery disease status post PCI x 2 to the left circumflex, 50% mid LAD, hyperlipidemia, hypothyroidism and GERD.   He is pending left open carpal tunnel release by Dr. Francisco on 02/13/2023 and presents today for telephonic preoperative cardiovascular risk assessment.  History of Present Illness    Willie Griffin is a 68 y.o. male who presents via audio/video conferencing for a telehealth visit today.  Pt was last seen in cardiology clinic on 10/09/2018 for by Redell Cave, MD.  At that time Willie Griffin was doing well   The patient is now pending procedure as outlined above. Since his last visit, he had pneumonia in September of 2024.Multiple rounds of antibiotics and steroids through [facility] X 3 visits, and saw PCP office  X 2.  He is feeling and breathing better now.  He is physically active playing softball, walking, without any cardiac complaints of chest pain, palpitations, heart racing, dizziness, or  excessive fatigue.  Past Medical History    Past Medical History:  Diagnosis Date   Allergy    allergic rhinitis   GERD (gastroesophageal reflux disease)    Hemorrhoid    Hypothyroidism    Peyronie disease    Past Surgical History:  Procedure Laterality Date   COLONOSCOPY     CORONARY STENT INTERVENTION N/A 08/21/2022   Procedure: CORONARY STENT INTERVENTION;  Surgeon: Darron Deatrice LABOR, MD;  Location: [facility];  Service: Cardiovascular;  Laterality: N/A;   INGUINAL HERNIA REPAIR  9/05   LEFT HEART CATH AND CORONARY ANGIOGRAPHY Left 08/21/2022   Procedure: LEFT HEART CATH AND CORONARY ANGIOGRAPHY;  Surgeon: Darron Deatrice LABOR, MD;  Location: [facility];  Service: Cardiovascular;  Laterality: Left;   MOHS SURGERY     nasal SCC----[facility]   TOENAIL EXCISION     partial   TONSILLECTOMY  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    Home Medications    Prior to Admission medications   Medication Sig Start Date End Date Taking? Authorizing Provider  albuterol  (VENTOLIN  HFA) 108 (90 Base) MCG/ACT inhaler Inhale 2 puffs into the lungs every 6 (six) hours as needed for wheezing or shortness of breath. 01/18/23   Teresa Shelba SAUNDERS, NP  aspirin  EC 81 MG tablet Take 1 tablet (81 mg total) by mouth daily. Swallow whole. 02/28/20   Darliss Rogue, MD  atorvastatin  (LIPITOR) 20 MG tablet Take 1 tablet (20 mg total) by mouth daily. 08/15/22 12/28/22  Darliss Rogue, MD  clopidogrel  (PLAVIX ) 75 MG tablet Take 1 tablet (75 mg total) by mouth daily with breakfast. 08/22/22   Dunn, Bernardino HERO, PA-C  Cyanocobalamin  (VITAMIN B-12 PO) Take 2 Doses by mouth daily.    [provider]  EPIPEN  2-PAK 0.3 MG/0.3ML SOAJ injection  INJECT 0.3 MG INTRAMUSCULARLY ONCE AS NEEDED FOR UP TO 1 DOSE 12/03/21   Jimmy Ade I, MD  hydrocortisone  2.5 % cream Use rectally twice daily as neeed 12/09/22   Letvak, Richard I, MD  levothyroxine  (SYNTHROID ) 25 MCG tablet TAKE 1 TABLET BY MOUTH EVERY DAY BEFORE BREAKFAST 09/18/22   Jimmy Ade FERNS, MD  OVER THE COUNTER MEDICATION Take 2 capsules by mouth daily. Synoviox includes MSM, turmeric, and hyaluronic acid    [provider]  pantoprazole  (PROTONIX ) 40 MG tablet Take 1 tablet (40 mg total) by mouth daily. 08/22/22   Dunn, Bernardino HERO, PA-C  SOOLANTRA 1 % CREA Apply topically daily. to face 01/15/17   [provider]  tadalafil  (CIALIS ) 20 MG tablet Take 0.5-1 tablets (10-20 mg total) by mouth every other day as needed for erectile dysfunction. 10/07/22   Jimmy Ade FERNS, MD    Physical Exam    Vital Signs:  Willie Griffin does not have vital signs available for review today.  Given telephonic nature of communication, physical exam is limited. AAOx3. NAD. Normal affect.  Speech and respirations are unlabored.  Accessory Clinical Findings    None  Assessment & Plan    1.  Preoperative Cardiovascular Risk Assessment:  According to the Revised Cardiac Risk Index (RCRI), his Perioperative Risk of Major Cardiac Event is (%): 0.4  His Functional Capacity in METs is: 9.89 according to the Duke Activity Status Index (DASI).   The patient was advised that if he develops new symptoms prior to surgery to contact our office to arrange for a follow-up visit, and he verbalized understanding.  Although not requested per office protocol, if patient is without any new symptoms or concerns at the time of their virtual visit, he may hold Plavix  for 7 days prior to procedure. Please resume Plavix  as soon as possible postprocedure, at the discretion of the surgeon.    Therefore, based on ACC/AHA guidelines, patient would be at acceptable risk for the planned procedure without  further cardiovascular testing. I will route this recommendation to the requesting party via Epic fax function.   A copy of this note will be routed to requesting surgeon.  Time:   Today, I have spent 10 minutes with the patient with telehealth technology discussing medical history, symptoms, and management plan.     Lamarr Satterfield, NP  02/10/2023, 3:50 PM

## 2023-02-11 ENCOUNTER — Encounter: Payer: Medicare Other | Admitting: *Deleted

## 2023-02-11 DIAGNOSIS — Z955 Presence of coronary angioplasty implant and graft: Secondary | ICD-10-CM | POA: Diagnosis not present

## 2023-02-11 NOTE — Progress Notes (Signed)
 Discharge Note for  Willie Griffin     1955/09/10        Willie Griffin graduated today from  rehab with 36 sessions completed.  Details of the patient's exercise prescription and what He needs to do in order to continue the prescription and progress were discussed with patient.  Patient was given a copy of prescription and goals.  Patient verbalized understanding. Willie Griffin plans to continue to exercise by exercising at home.      6 Minute Walk     Row Name 09/15/22 1711 12/19/22 0937       6 Minute Walk   Phase Initial Discharge    Distance 1440 feet 1740 feet    Walk Time 6 minutes 6 minutes    # of Rest Breaks -- 0    MPH 2.72 3.3    METS 3.59 4.45    RPE 12 14    Perceived Dyspnea  0 0    VO2 Peak 12.56 15.57    Symptoms Yes (comment) Yes (comment)    Comments Knee pain and Left hip pain Knee pain    Resting HR 60 bpm 65 bpm    Resting BP 120/60 130/58    Resting Oxygen Saturation  97 % 97 %    Exercise Oxygen Saturation  during 6 min walk 67 % 95 %    Max Ex. HR 102 bpm 112 bpm    Max Ex. BP 140/72 162/66    2 Minute Post BP 126/66 --

## 2023-02-11 NOTE — Progress Notes (Signed)
 Daily Session Note  Patient Details  Name: Willie Griffin MRN: [id number] Date of Birth: Jan 12, 1956 Referring Provider:   Flowsheet Row Cardiac Rehab from 09/15/2022 in [facility]  Referring Provider Dr.Arida       Encounter Date: 02/11/2023  Check In:  Session Check In - 02/11/23 0929       Check-In   Supervising physician immediately available to respond to emergencies See telemetry face sheet for immediately available ER MD    Location [facility]    Staff Present Maud Sorenson, RN, BSN, CCRP;Joseph Hood RCP,RRT,BSRT;Maxon [place] BS, Exercise Physiologist;Jason Martina Sledge RDN,LDN    Virtual Visit No    Medication changes reported     No    Fall or balance concerns reported    No    Warm-up and Cool-down Performed on first and last piece of equipment    Resistance Training Performed Yes    VAD Patient? No    PAD/SET Patient? No      Pain Assessment   Currently in Pain? No/denies                Social History   Tobacco Use  Smoking Status Never   Passive exposure: Past  Smokeless Tobacco Never    Goals Met:  Independence with exercise equipment Exercise tolerated well Personal goals reviewed No report of concerns or symptoms today  Goals Unmet:  Not Applicable  Comments:  Willie Griffin graduated today from  rehab with 36 sessions completed.  Details of the patient's exercise prescription and what He needs to do in order to continue the prescription and progress were discussed with patient.  Patient was given a copy of prescription and goals.  Patient verbalized understanding. Willie Griffin plans to continue to exercise by exercising at home.    Dr. Firman Hughes is Medical Director for [facility].  Dr. Fuad Aleskerov is Medical Director for [facility] Pulmonary Rehabilitation.

## 2023-02-11 NOTE — Progress Notes (Signed)
 Cardiac Individual Treatment Plan  Patient Details  Name: Willie Griffin MRN: [id number] Date of Birth: [birth year] Referring Provider:   Flowsheet Row Cardiac Rehab from 09/15/2022 in [facility] Cardiac and Pulmonary Rehab  Referring Provider Dr.Arida       Initial Encounter Date:  Flowsheet Row Cardiac Rehab from 09/15/2022 in [facility] Cardiac and Pulmonary Rehab  Date 09/15/22       Visit Diagnosis: Status post coronary artery stent placement  Patient's Home Medications on Admission:  Current Outpatient Medications:    albuterol  (VENTOLIN  HFA) 108 (90 Base) MCG/ACT inhaler, Inhale 2 puffs into the lungs every 6 (six) hours as needed for wheezing or shortness of breath., Disp: 8 g, Rfl: 2   aspirin  EC 81 MG tablet, Take 1 tablet (81 mg total) by mouth daily. Swallow whole., Disp: 90 tablet, Rfl: 3   atorvastatin  (LIPITOR) 20 MG tablet, Take 1 tablet (20 mg total) by mouth daily., Disp: 90 tablet, Rfl: 3   clopidogrel  (PLAVIX ) 75 MG tablet, Take 1 tablet (75 mg total) by mouth daily with breakfast., Disp: 30 tablet, Rfl: 11   Cyanocobalamin (VITAMIN B-12 PO), Take 2 Doses by mouth daily., Disp: , Rfl:    EPIPEN  2-PAK 0.3 MG/0.3ML SOAJ injection, INJECT 0.3 MG INTRAMUSCULARLY ONCE AS NEEDED FOR UP TO 1 DOSE, Disp: 2 each, Rfl: 1   hydrocortisone  2.5 % cream, Use rectally twice daily as neeed, Disp: 30 g, Rfl: 5   levothyroxine  (SYNTHROID ) 25 MCG tablet, TAKE 1 TABLET BY MOUTH EVERY DAY BEFORE BREAKFAST, Disp: 90 tablet, Rfl: 3   OVER THE COUNTER MEDICATION, Take 2 capsules by mouth daily. Synoviox includes MSM, turmeric, and hyaluronic acid, Disp: , Rfl:    pantoprazole  (PROTONIX ) 40 MG tablet, Take 1 tablet (40 mg total) by mouth daily., Disp: 30 tablet, Rfl: 5   SOOLANTRA 1 % CREA, Apply topically daily. to face, Disp: , Rfl: 5   tadalafil  (CIALIS ) 20 MG tablet, Take 0.5-1 tablets (10-20 mg total) by mouth every other day as needed for erectile dysfunction., Disp: 10 tablet, Rfl: 11  Past  Medical History: Past Medical History:  Diagnosis Date   Allergy    allergic rhinitis   GERD (gastroesophageal reflux disease)    Hemorrhoid    Hypothyroidism    Peyronie disease     Tobacco Use: Social History   Tobacco Use  Smoking Status Never   Passive exposure: Past  Smokeless Tobacco Never    Labs: Review Flowsheet  More data exists      Latest Ref Rng & Units 09/12/2020 11/23/2020 04/08/2021 12/03/2021 12/09/2022  Labs for ITP Cardiac and Pulmonary Rehab  Cholestrol 0 - 200 mg/dL 528  413  244  010  272   LDL (calc) 0 - 99 mg/dL 536  87  94  99  58   HDL-C >39.00 mg/dL 37  64.40  38  34.74  25.95   Trlycerides 0.0 - 149.0 mg/dL 96  63.8  95  75.6  43.3      Exercise Target Goals: Exercise Program Goal: Individual exercise prescription set using results from initial 6 min walk test and THRR while considering  patient's activity barriers and safety.   Exercise Prescription Goal: Initial exercise prescription builds to 30-45 minutes a day of aerobic activity, 2-3 days per week.  Home exercise guidelines will be given to patient during program as part of exercise prescription that the participant will acknowledge.   Education: Aerobic Exercise: - Group verbal and visual presentation on the  components of exercise prescription. Introduces F.I.T.T principle from ACSM for exercise prescriptions.  Reviews F.I.T.T. principles of aerobic exercise including progression. Written material given at graduation.   Education: Resistance Exercise: - Group verbal and visual presentation on the components of exercise prescription. Introduces F.I.T.T principle from ACSM for exercise prescriptions  Reviews F.I.T.T. principles of resistance exercise including progression. Written material given at graduation.    Education: Exercise & Equipment Safety: - Individual verbal instruction and demonstration of equipment use and safety with use of the equipment. Flowsheet Row Cardiac Rehab  from 09/15/2022 in [facility] Cardiac and Pulmonary Rehab  Date 09/15/22  Educator MB  Instruction Review Code 1- Verbalizes Understanding       Education: Exercise Physiology & General Exercise Guidelines: - Group verbal and written instruction with models to review the exercise physiology of the cardiovascular system and associated critical values. Provides general exercise guidelines with specific guidelines to those with heart or lung disease.  Flowsheet Row Cardiac Rehab from 09/15/2022 in [facility] Cardiac and Pulmonary Rehab  Education need identified 09/15/22       Education: Flexibility, Balance, Mind/Body Relaxation: - Group verbal and visual presentation with interactive activity on the components of exercise prescription. Introduces F.I.T.T principle from ACSM for exercise prescriptions. Reviews F.I.T.T. principles of flexibility and balance exercise training including progression. Also discusses the mind body connection.  Reviews various relaxation techniques to help reduce and manage stress (i.e. Deep breathing, progressive muscle relaxation, and visualization). Balance handout provided to take home. Written material given at graduation.   Activity Barriers & Risk Stratification:  Activity Barriers & Cardiac Risk Stratification - 09/15/22 1713       Activity Barriers & Cardiac Risk Stratification   Activity Barriers Back Problems;Joint Problems    Cardiac Risk Stratification Moderate             6 Minute Walk:  6 Minute Walk     Row Name 09/15/22 1711 12/19/22 0937       6 Minute Walk   Phase Initial Discharge    Distance 1440 feet 1740 feet    Walk Time 6 minutes 6 minutes    # of Rest Breaks -- 0    MPH 2.72 3.3    METS 3.59 4.45    RPE 12 14    Perceived Dyspnea  0 0    VO2 Peak 12.56 15.57    Symptoms Yes (comment) Yes (comment)    Comments Knee pain and Left hip pain Knee pain    Resting HR 60 bpm 65 bpm    Resting BP 120/60 130/58    Resting Oxygen  Saturation  97 % 97 %    Exercise Oxygen Saturation  during 6 min walk 67 % 95 %    Max Ex. HR 102 bpm 112 bpm    Max Ex. BP 140/72 162/66    2 Minute Post BP 126/66 --             Oxygen Initial Assessment:   Oxygen Re-Evaluation:   Oxygen Discharge (Final Oxygen Re-Evaluation):   Initial Exercise Prescription:  Initial Exercise Prescription - 11/20/22 1000       Treadmill   METs 3.72      Recumbant Bike   Watts 48    METs 3.9      NuStep   METs 3.9      REL-XR   Level 7    Minutes 15    METs 6.8  Perform Capillary Blood Glucose checks as needed.  Exercise Prescription Changes:   Exercise Prescription Changes     Row Name 09/15/22 1700 11/20/22 1000 11/24/22 0900 12/04/22 1400 12/18/22 1600     Response to Exercise   Blood Pressure (Admit) 120/60 106/68 -- 102/58 118/66   Blood Pressure (Exercise) 140/72 -- -- 166/58 --   Blood Pressure (Exit) 126/66 108/58 -- 120/56 98/58   Heart Rate (Admit) 60 bpm 69 bpm -- 82 bpm 74 bpm   Heart Rate (Exercise) 102 bpm 122 bpm -- 112 bpm 110 bpm   Heart Rate (Exit) 68 bpm 82 bpm -- 85 bpm 103 bpm   Oxygen Saturation (Admit) 97 % -- -- 97 % 98 %   Oxygen Saturation (Exercise) 97 % -- -- 95 % 96 %   Oxygen Saturation (Exit) 97 % -- -- 97 % 97 %   Rating of Perceived Exertion (Exercise) 12 14 -- 15 15   Perceived Dyspnea (Exercise) 0 0 -- 0 0   Symptoms knee pain and left hip pain -- -- none none   Comments results -- -- -- --   Duration Progress to 30 minutes of  aerobic without signs/symptoms of physical distress Progress to 30 minutes of  aerobic without signs/symptoms of physical distress Progress to 30 minutes of  aerobic without signs/symptoms of physical distress Progress to 30 minutes of  aerobic without signs/symptoms of physical distress Progress to 30 minutes of  aerobic without signs/symptoms of physical distress   Intensity -- THRR unchanged THRR New  98-136 THRR unchanged  98-136 THRR  unchanged  98-136     Progression   Progression -- Continue to progress workloads to maintain intensity without signs/symptoms of physical distress. Continue to progress workloads to maintain intensity without signs/symptoms of physical distress. Continue to progress workloads to maintain intensity without signs/symptoms of physical distress. Continue to progress workloads to maintain intensity without signs/symptoms of physical distress.   Average METs -- 4.25 4.25 4.25 5.48     Resistance Training   Training Prescription -- Yes Yes Yes Yes   Weight -- 5 lb 5 lb 5 lb 5 lb   Reps -- 10-15 10-15 10-15 10-15     Interval Training   Interval Training -- No No No No     Treadmill   MPH -- 2.8 2.8 3 3    Grade -- 1.5 1.5 3 3    Minutes -- 15 15 15 15    METs -- -- 3.72 4.54 4.54     Recumbant Bike   Level -- 2.5  48 watt 2.5  48 watt 4 --   Watts -- -- 48 39 --   Minutes -- 15 15 15  --   METs -- -- 3.9 3.55 --     NuStep   Level -- 4 4 6 4    Minutes -- 15 15 15 15    METs -- -- 3.9 -- 6     REL-XR   Level -- -- 7 8 8    Minutes -- -- 15 15 15    METs -- -- 6.8 7.6 7.9     T5 Nustep   Level -- -- -- 3 --   Minutes -- -- -- 15 --   METs -- -- -- 4.6 --     Oxygen   Maintain Oxygen Saturation -- 88% or higher 88% or higher 88% or higher 88% or higher    Row Name 12/30/22 1300 01/15/23 1400  Response to Exercise   Blood Pressure (Admit) 124/64 112/60      Blood Pressure (Exit) 128/60 132/68      Heart Rate (Admit) 72 bpm 67 bpm      Heart Rate (Exercise) 112 bpm 107 bpm      Heart Rate (Exit) 101 bpm 86 bpm      Oxygen Saturation (Admit) 98 % 98 %      Oxygen Saturation (Exercise) 94 % 95 %      Oxygen Saturation (Exit) 96 % 96 %      Rating of Perceived Exertion (Exercise) 14 15      Perceived Dyspnea (Exercise) 0 --      Symptoms none none      Duration Progress to 30 minutes of  aerobic without signs/symptoms of physical distress Continue with 30 min of  aerobic exercise without signs/symptoms of physical distress.      Intensity THRR unchanged THRR unchanged        Progression   Progression Continue to progress workloads to maintain intensity without signs/symptoms of physical distress. Continue to progress workloads to maintain intensity without signs/symptoms of physical distress.      Average METs 4.4 5        Resistance Training   Training Prescription Yes Yes      Weight 5 lb 5 lb      Reps 10-15 10-15        Interval Training   Interval Training No No        Treadmill   MPH 3 3      Grade 4 3.5      Minutes 15 15      METs 4.95 4.75        REL-XR   Level 7 8      Minutes 15 15      METs 5.8 7.6        T5 Nustep   Level 2 3      Minutes 15 15      METs 2.6 3.6        Oxygen   Maintain Oxygen Saturation 88% or higher 88% or higher               Exercise Comments:   Exercise Comments     Row Name 09/22/22 1054 02/11/23 1119         Exercise Comments First full day of exercise!  Patient was oriented to gym and equipment including functions, settings, policies, and procedures.  Patient's individual exercise prescription and treatment plan were reviewed.  All starting workloads were established based on the results of the 6 minute walk test done at initial orientation visit.  The plan for exercise progression was also introduced and progression will be customized based on patient's performance and goals. Willie Griffin graduated today from  rehab with 36 sessions completed.  Details of the patient's exercise prescription and what He needs to do in order to continue the prescription and progress were discussed with patient.  Patient was given a copy of prescription and goals.  Patient verbalized understanding. Willie Griffin plans to continue to exercise by exercising at home.               Exercise Goals and Review:   Exercise Goals     Row Name 09/15/22 1719             Exercise Goals   Increase Physical Activity Yes        Intervention Provide advice, education,  support and counseling about physical activity/exercise needs.;Develop an individualized exercise prescription for aerobic and resistive training based on initial evaluation findings, risk stratification, comorbidities and participant's personal goals.       Expected Outcomes Short Term: Attend rehab on a regular basis to increase amount of physical activity.;Long Term: Exercising regularly at least 3-5 days a week.;Long Term: Add in home exercise to make exercise part of routine and to increase amount of physical activity.       Increase Strength and Stamina Yes       Intervention Provide advice, education, support and counseling about physical activity/exercise needs.;Develop an individualized exercise prescription for aerobic and resistive training based on initial evaluation findings, risk stratification, comorbidities and participant's personal goals.       Expected Outcomes Short Term: Increase workloads from initial exercise prescription for resistance, speed, and METs.;Short Term: Perform resistance training exercises routinely during rehab and add in resistance training at home;Long Term: Improve cardiorespiratory fitness, muscular endurance and strength as measured by increased METs and functional capacity ( )       Able to understand and use rate of perceived exertion (RPE) scale Yes       Intervention Provide education and explanation on how to use RPE scale       Expected Outcomes Short Term: Able to use RPE daily in rehab to express subjective intensity level;Long Term:  Able to use RPE to guide intensity level when exercising independently       Able to understand and use Dyspnea scale Yes       Intervention Provide education and explanation on how to use Dyspnea scale       Expected Outcomes Short Term: Able to use Dyspnea scale daily in rehab to express subjective sense of shortness of breath during exertion;Long Term: Able to use Dyspnea  scale to guide intensity level when exercising independently       Knowledge and understanding of Target Heart Rate Range (THRR) Yes       Intervention Provide education and explanation of THRR including how the numbers were predicted and where they are located for reference       Expected Outcomes Short Term: Able to state/look up THRR;Long Term: Able to use THRR to govern intensity when exercising independently;Short Term: Able to use daily as guideline for intensity in rehab       Able to check pulse independently Yes       Intervention Provide education and demonstration on how to check pulse in carotid and radial arteries.;Review the importance of being able to check your own pulse for safety during independent exercise       Expected Outcomes Short Term: Able to explain why pulse checking is important during independent exercise;Long Term: Able to check pulse independently and accurately       Understanding of Exercise Prescription Yes       Intervention Provide education, explanation, and written materials on patient's individual exercise prescription       Expected Outcomes Short Term: Able to explain program exercise prescription;Long Term: Able to explain home exercise prescription to exercise independently                Exercise Goals Re-Evaluation :  Exercise Goals Re-Evaluation     Row Name 09/22/22 1054 11/20/22 1049 11/24/22 0946 12/04/22 1415 12/15/22 0944     Exercise Goal Re-Evaluation   Exercise Goals Review Able to understand and use rate of perceived exertion (RPE) scale;Able to understand and use Dyspnea  scale;Knowledge and understanding of Target Heart Rate Range (THRR);Understanding of Exercise Prescription Increase Physical Activity;Increase Strength and Stamina;Understanding of Exercise Prescription Increase Strength and Stamina;Increase Physical Activity;Able to understand and use rate of perceived exertion (RPE) scale;Able to understand and use Dyspnea  scale;Knowledge and understanding of Target Heart Rate Range (THRR);Able to check pulse independently;Understanding of Exercise Prescription Increase Physical Activity;Increase Strength and Stamina;Understanding of Exercise Prescription Increase Physical Activity;Increase Strength and Stamina;Understanding of Exercise Prescription   Comments Reviewed RPE  and dyspnea scale, THR and program prescription with pt today.  Pt voiced understanding and was given a copy of goals to take home. Willie Griffin continues to do well in rehab. He has been able to maintain his intensity on the XR as well as the Treadmill. We will continue to monitor his progress in the program. Reviewed home exercise with pt today.  Pt plans to walk at home for exercise.  Reviewed THR, pulse, RPE, sign and symptoms, pulse oximetery and when to call 911 or MD.  Also discussed weather considerations and indoor options.  Pt voiced understanding. Willie Griffin continues to make improvements in rehab. He has been able to increase his level on the T4 nustep from level 4 to level 6. He was also able to increase his incline on the treadmill up to 3%. We will continue to monitor his progress in the program. Patient reports that he is not currently exercising at home. He was encouraged to add 1-2 days a week of walking at home on off days of rehab to aim toward exercising 4-5 days a week. High intensity interval training was also discussed with patient to start incorporating into his workouts in rehab classes as he feels he has made some progress but in plateuing a bit. He will start with 30 second high intensity intervals every 2-3 min. on the XR and TM.   Expected Outcomes Short: Use RPE daily to regulate intensity. Long: Follow program prescription in THR. Short: Continue to follow current exercise prescription. Long: Continue exercise to improve strength and stamina. Short: add 1-2 days a week a home walking. Long: become independent with exercise routine. Short:  Continue to increase treadmill workloads. Long: Continue to exercise to improve strength and stamina. Short: add 1-2 days a week of exerics at home and start interval training in cardiac rehab class. Long: continue with independent exercise program.    Row Name 12/18/22 1623 12/30/22 1355 01/15/23 1433 01/27/23 1547       Exercise Goal Re-Evaluation   Exercise Goals Review Increase Physical Activity;Increase Strength and Stamina;Understanding of Exercise Prescription Increase Physical Activity;Increase Strength and Stamina;Understanding of Exercise Prescription Increase Physical Activity;Increase Strength and Stamina;Understanding of Exercise Prescription Increase Physical Activity;Increase Strength and Stamina;Understanding of Exercise Prescription    Comments Willie Griffin continues to do well in rehab. He has been able to maintain his intensity on the treadmill at a speed of and an incline of 3%. He has also maintained his level on the T4 nustep at level 4. We will continue to monitor his progress in the program. Willie Griffin is doing well in rehab. He was recently able to increase his incline on the treadmill from 3% to 4%. He has also been able to maintain his workload on both the T5 nustep and the XR. We will continue to monitor his progress in the program. Willie Griffin continues to do well in the program and is close to graduating. He recently improved to level 8 on the XR and level 3 on the T5 nustep. He  also has stayed consistent on the treadmill at a speed of 3 mph and an incline of 3.5%. We will continue to monitor his progress until he graduates from the program. Willie Griffin has not attended rehab during this review. We will attempt to reach out in order to determine his status.    Expected Outcomes Short: Continue to progressively increase treadmill workload. Long: Continue exercise to improve strength and stamina. Short: Continue to progressively increase treadmill workload. Long: Continue exercise to improve strength  and stamina. Short: Graduate. Long: Continue to exercise independently. Short: return to rehab. Long: Continue to exercise to increase strength and stamina.             Discharge Exercise Prescription (Final Exercise Prescription Changes):  Exercise Prescription Changes - 01/15/23 1400       Response to Exercise   Blood Pressure (Admit) 112/60    Blood Pressure (Exit) 132/68    Heart Rate (Admit) 67 bpm    Heart Rate (Exercise) 107 bpm    Heart Rate (Exit) 86 bpm    Oxygen Saturation (Admit) 98 %    Oxygen Saturation (Exercise) 95 %    Oxygen Saturation (Exit) 96 %    Rating of Perceived Exertion (Exercise) 15    Symptoms none    Duration Continue with 30 min of aerobic exercise without signs/symptoms of physical distress.    Intensity THRR unchanged      Progression   Progression Continue to progress workloads to maintain intensity without signs/symptoms of physical distress.    Average METs 5      Resistance Training   Training Prescription Yes    Weight 5 lb    Reps 10-15      Interval Training   Interval Training No      Treadmill   MPH 3    Grade 3.5    Minutes 15    METs 4.75      REL-XR   Level 8    Minutes 15    METs 7.6      T5 Nustep   Level 3    Minutes 15    METs 3.6      Oxygen   Maintain Oxygen Saturation 88% or higher             Nutrition:  Target Goals: Understanding of nutrition guidelines, daily intake of sodium 1500mg , cholesterol 200mg , calories 30% from fat and 7% or less from saturated fats, daily to have 5 or more servings of fruits and vegetables.  Education: All About Nutrition: -Group instruction provided by verbal, written material, interactive activities, discussions, models, and posters to present general guidelines for heart healthy nutrition including fat, fiber, MyPlate, the role of sodium in heart healthy nutrition, utilization of the nutrition label, and utilization of this knowledge for meal planning. Follow up  email sent as well. Written material given at graduation. Flowsheet Row Cardiac Rehab from 09/15/2022 in [facility] Cardiac and Pulmonary Rehab  Education need identified 09/15/22       Biometrics:  Pre Biometrics - 09/15/22 1719       Pre Biometrics   Height 5' 8.8" (1.748 m)    Weight 179 lb (81.2 kg)    Waist Circumference 38 inches    Hip Circumference 40.5 inches    Waist to Hip Ratio 0.94 %    BMI (Calculated) 26.57    Single Leg Stand 30 seconds             Post Biometrics - 12/19/22 0940  Post  Biometrics   Height 5' 8.8" (1.748 m)    Weight 173 lb 4.8 oz (78.6 kg)    Waist Circumference 35 inches    Hip Circumference 36 inches    Waist to Hip Ratio 0.97 %    BMI (Calculated) 25.73    Single Leg Stand 30 seconds             Nutrition Therapy Plan and Nutrition Goals:  Nutrition Therapy & Goals - 09/22/22 1154       Nutrition Therapy   Diet Cardiac, low sodum    Protein (specify units) 90    Fiber 30 grams    Whole Grain Foods 3 servings    Saturated Fats 15 max. grams    Fruits and Vegetables 5 servings/day    Sodium 2 grams      Personal Nutrition Goals   Nutrition Goal Drink more water, ~64oz, less sugary beverages    Personal Goal #2 Watch carb portions, pair carbs with protein at meals    Personal Goal #3 Work on building better habits with new routine    Comments Patient drinking some water, ~32oz but is mostly drinking high calorie sugary beverages. Says he drinks them because he likes them. Spoke with him about the importance of water and reducing sugar intake. He is agreeable to setting a goal of more water, less sugary beverages. He reports he doesn't have a routine, and struggles to be consistent with his eating habits. Sometimes missing meal or snacking on junk. Discussed setting goal to be more consistent. He reports he will be going back to work soon, and will have more of a routine. Reviewed mediterranean diet handout, types of fats,  sources, how to read label. Brainstormed and built out several meals and snacks with foods he likes and will eat, focusing on controlled portions of carbs, paired with healthier proteins and fats.      Intervention Plan   Intervention Prescribe, educate and counsel regarding individualized specific dietary modifications aiming towards targeted core components such as weight, hypertension, lipid management, diabetes, heart failure and other comorbidities.;Nutrition handout(s) given to patient.    Expected Outcomes Short Term Goal: Understand basic principles of dietary content, such as calories, fat, sodium, cholesterol and nutrients.;Short Term Goal: A plan has been developed with personal nutrition goals set during dietitian appointment.;Long Term Goal: Adherence to prescribed nutrition plan.             Nutrition Assessments:  Nutrition Assessments - 09/15/22 1723       Rate Your Plate Scores   Pre Score --            MEDIFICTS Score Key: >=70 Need to make dietary changes  40-70 Heart Healthy Diet <= 40 Therapeutic Level Cholesterol Diet  Flowsheet Row Cardiac Rehab from 09/15/2022 in [facility] Cardiac and Pulmonary Rehab  Picture Your Plate Total Score on Admission 54      Picture Your Plate Scores: <03 Unhealthy dietary pattern with much room for improvement. 41-50 Dietary pattern unlikely to meet recommendations for good health and room for improvement. 51-60 More healthful dietary pattern, with some room for improvement.  >60 Healthy dietary pattern, although there may be some specific behaviors that could be improved.    Nutrition Goals Re-Evaluation:  Nutrition Goals Re-Evaluation     Row Name 10/27/22 1008 11/24/22 0934 12/15/22 0948         Goals   Nutrition Goal Drink more water, ~64oz, less sugary beverages Drink more water, ~  64oz, less sugary beverages Drink more water, ~64oz, less sugary beverages     Comment Patient reports that he has cut back on sugary  beverages. He usually has 1 soda a day but not he sometimes goes a day or 2 without any. This is the main nutrition goals he has been working on. He has not yet started to work on his other nutition goals. Patient reports that he is trying to drink more water Patient reports that he has reduced soda to one every few days when he used to drink one soda every day.     Expected Outcome Short: try to be more mindful of eating habits and pairing carbs with protein. Long: establish and maintain heart healthy eating habits. Short: continue to work towards 64 fl oz a day of water intake. Long: be more consious about nutrition choices. Short: continue to work towards eliminating sodas and drinking more water in place of it. Long: maintain heart healthy diet.       Personal Goal #2 Re-Evaluation   Personal Goal #2 Watch carb portions, pair carbs with protein at meals -- --       Personal Goal #3 Re-Evaluation   Personal Goal #3 Work on building better habits with new routine -- --              Nutrition Goals Discharge (Final Nutrition Goals Re-Evaluation):  Nutrition Goals Re-Evaluation - 12/15/22 0948       Goals   Nutrition Goal Drink more water, ~64oz, less sugary beverages    Comment Patient reports that he has reduced soda to one every few days when he used to drink one soda every day.    Expected Outcome Short: continue to work towards eliminating sodas and drinking more water in place of it. Long: maintain heart healthy diet.             Psychosocial: Target Goals: Acknowledge presence or absence of significant depression and/or stress, maximize coping skills, provide positive support system. Participant is able to verbalize types and ability to use techniques and skills needed for reducing stress and depression.   Education: Stress, Anxiety, and Depression - Group verbal and visual presentation to define topics covered.  Reviews how body is impacted by stress, anxiety, and  depression.  Also discusses healthy ways to reduce stress and to treat/manage anxiety and depression.  Written material given at graduation.   Education: Sleep Hygiene -Provides group verbal and written instruction about how sleep can affect your health.  Define sleep hygiene, discuss sleep cycles and impact of sleep habits. Review good sleep hygiene tips.    Initial Review & Psychosocial Screening:  Initial Psych Review & Screening - 09/11/22 1344       Family Dynamics   Good Support System? Yes      Barriers   Psychosocial barriers to participate in program There are no identifiable barriers or psychosocial needs.;The patient should benefit from training in stress management and relaxation.      Screening Interventions   Interventions Encouraged to exercise;Provide feedback about the scores to participant;To provide support and resources with identified psychosocial needs    Expected Outcomes Long Term Goal: Stressors or current issues are controlled or eliminated.;Short Term goal: Utilizing psychosocial counselor, staff and physician to assist with identification of specific Stressors or current issues interfering with healing process. Setting desired goal for each stressor or current issue identified.;Long Term goal: The participant improves quality of Life and PHQ9 Scores as seen by post  scores and/or verbalization of changes;Short Term goal: Identification and review with participant of any Quality of Life or Depression concerns found by scoring the questionnaire.             Quality of Life Scores:   Quality of Life - 09/15/22 1721       Quality of Life   Select Quality of Life      Quality of Life Scores   Health/Function Pre 24.2 %    Socioeconomic Pre 26.29 %    Psych/Spiritual Pre 24.86 %    Family Pre 27.6 %    GLOBAL Pre 25.39 %            Scores of 19 and below usually indicate a poorer quality of life in these areas.  A difference of  2-3 points is a  clinically meaningful difference.  A difference of 2-3 points in the total score of the Quality of Life Index has been associated with significant improvement in overall quality of life, self-image, physical symptoms, and general health in studies assessing change in quality of life.  PHQ-9: Review Flowsheet  More data exists      12/09/2022 09/15/2022 12/03/2021 11/23/2020 11/23/2019  Depression screen PHQ 2/9  Decreased Interest 0 0 0 0 0 0  Down, Depressed, Hopeless 0 0 0 0 0 0  PHQ - 2 Score 0 0 0 0 0 0  Altered sleeping - 0 - - -  Tired, decreased energy - 0 - - -  Change in appetite - 0 - - -  Feeling bad or failure about yourself  - 0 - - -  Trouble concentrating - 0 - - -  Moving slowly or fidgety/restless - 0 - - -  Suicidal thoughts - 0 - - -  PHQ-9 Score - 0 - - -    Details       Multiple values from one day are sorted in reverse-chronological order        Interpretation of Total Score  Total Score Depression Severity:  1-4 = Minimal depression, 5-9 = Mild depression, 10-14 = Moderate depression, 15-19 = Moderately severe depression, 20-27 = Severe depression   Psychosocial Evaluation and Intervention:  Psychosocial Evaluation - 09/11/22 1359       Psychosocial Evaluation & Interventions   Interventions Encouraged to exercise with the program and follow exercise prescription    Comments Willie Griffin is coming to cardiac rehab post stents. He notes that he can tell a positive difference in his stamina, but wants to improve it more. He has had an episode of chest pain which was relieved with rest. He reports having a good support system and has no stress concerns at this time. He wants to come work on his strength and gain more knowledge about living a heart healthy life.    Expected Outcomes Short: attend cardiac rehab for education and exercise. Long: develop and maintain positive self care habits.    Continue Psychosocial Services  Follow up required by staff              Psychosocial Re-Evaluation:  Psychosocial Re-Evaluation     Row Name 10/27/22 1022 11/24/22 0949 12/15/22 0951         Psychosocial Re-Evaluation   Current issues with None Identified None Identified None Identified     Comments Patient reports there have not been any changes to in sleep, stress, or mental health, but that he does not sleep enough. This is his usual but he reports  that he is often tired during the day and stays up to late trying to get things done. He was encouraged to try to establish a consistent bedtime routine to help aid in preparing the body for bettter sleep. no changes reported in in stress, sleep, or mental health. Continue to attend cardiac rehab and maintain good mental health habits. no changes reported in in stress, sleep, or mental health. He does report that some nights he only gets 4 hours of sleep. He always wakes up at the same time so he nees to work on getting to bed earlier if to get more sleep. Continue to attend cardiac rehab and maintain good mental health habits.     Expected Outcomes Short: establish a consistent bedtime routine. Long: maintain good mental health routine. Short: establish a consistent bedtime routine. Long: maintain good mental health routine. Short: establish a consistent bedtime routine. Long: maintain good mental health routine.     Interventions Encouraged to attend Cardiac Rehabilitation for the exercise Encouraged to attend Cardiac Rehabilitation for the exercise Encouraged to attend Cardiac Rehabilitation for the exercise     Continue Psychosocial Services  Follow up required by staff Follow up required by staff Follow up required by staff              Psychosocial Discharge (Final Psychosocial Re-Evaluation):  Psychosocial Re-Evaluation - 12/15/22 0951       Psychosocial Re-Evaluation   Current issues with None Identified    Comments no changes reported in in stress, sleep, or mental health. He does report that some  nights he only gets 4 hours of sleep. He always wakes up at the same time so he nees to work on getting to bed earlier if to get more sleep. Continue to attend cardiac rehab and maintain good mental health habits.    Expected Outcomes Short: establish a consistent bedtime routine. Long: maintain good mental health routine.    Interventions Encouraged to attend Cardiac Rehabilitation for the exercise    Continue Psychosocial Services  Follow up required by staff             Vocational Rehabilitation: Provide vocational rehab assistance to qualifying candidates.   Vocational Rehab Evaluation & Intervention:  Vocational Rehab - 09/11/22 1344       Initial Vocational Rehab Evaluation & Intervention   Assessment shows need for Vocational Rehabilitation No             Education: Education Goals: Education classes will be provided on a variety of topics geared toward better understanding of heart health and risk factor modification. Participant will state understanding/return demonstration of topics presented as noted by education test scores.  Learning Barriers/Preferences:  Learning Barriers/Preferences - 09/11/22 1343       Learning Barriers/Preferences   Learning Barriers None    Learning Preferences Individual Instruction             General Cardiac Education Topics:  AED/CPR: - Group verbal and written instruction with the use of models to demonstrate the basic use of the AED with the basic ABC's of resuscitation.   Anatomy and Cardiac Procedures: - Group verbal and visual presentation and models provide information about basic cardiac anatomy and function. Reviews the testing methods done to diagnose heart disease and the outcomes of the test results. Describes the treatment choices: Medical Management, Angioplasty, or Coronary Bypass Surgery for treating various heart conditions including Myocardial Infarction, Angina, Valve Disease, and Cardiac Arrhythmias.   Written material given at graduation.  Flowsheet Row Cardiac Rehab from 09/15/2022 in [facility] Cardiac and Pulmonary Rehab  Education need identified 09/15/22       Medication Safety: - Group verbal and visual instruction to review commonly prescribed medications for heart and lung disease. Reviews the medication, class of the drug, and side effects. Includes the steps to properly store meds and maintain the prescription regimen.  Written material given at graduation.   Intimacy: - Group verbal instruction through game format to discuss how heart and lung disease can affect sexual intimacy. Written material given at graduation..   Know Your Numbers and Heart Failure: - Group verbal and visual instruction to discuss disease risk factors for cardiac and pulmonary disease and treatment options.  Reviews associated critical values for Overweight/Obesity, Hypertension, Cholesterol, and Diabetes.  Discusses basics of heart failure: signs/symptoms and treatments.  Introduces Heart Failure Zone chart for action plan for heart failure.  Written material given at graduation.   Infection Prevention: - Provides verbal and written material to individual with discussion of infection control including proper hand washing and proper equipment cleaning during exercise session. Flowsheet Row Cardiac Rehab from 09/15/2022 in [facility] Cardiac and Pulmonary Rehab  Date 09/15/22  Educator MB  Instruction Review Code 1- Verbalizes Understanding       Falls Prevention: - Provides verbal and written material to individual with discussion of falls prevention and safety. Flowsheet Row Cardiac Rehab from 09/15/2022 in [facility] Cardiac and Pulmonary Rehab  Date 09/15/22  Educator MB  Instruction Review Code 1- Verbalizes Understanding       Other: -Provides group and verbal instruction on various topics (see comments)   Knowledge Questionnaire Score:  Knowledge Questionnaire Score - 09/15/22 1724       Knowledge  Questionnaire Score   Pre Score 23/26             Core Components/Risk Factors/Patient Goals at Admission:  Personal Goals and Risk Factors at Admission - 09/15/22 1725       Core Components/Risk Factors/Patient Goals on Admission    Weight Management Yes    Intervention Weight Management: Provide education and appropriate resources to help participant work on and attain dietary goals.;Weight Management: Develop a combined nutrition and exercise program designed to reach desired caloric intake, while maintaining appropriate intake of nutrient and fiber, sodium and fats, and appropriate energy expenditure required for the weight goal.    Expected Outcomes Weight Maintenance: Understanding of the daily nutrition guidelines, which includes 25-35% calories from fat, 7% or less cal from saturated fats, less than 200mg  cholesterol, less than 1.5gm of sodium, & 5 or more servings of fruits and vegetables daily;Understanding recommendations for meals to include 15-35% energy as protein, 25-35% energy from fat, 35-60% energy from carbohydrates, less than 200mg  of dietary cholesterol, 20-35 gm of total fiber daily;Understanding of distribution of calorie intake throughout the day with the consumption of 4-5 meals/snacks;Short Term: Continue to assess and modify interventions until short term weight is achieved;Long Term: Adherence to nutrition and physical activity/exercise program aimed toward attainment of established weight goal    Lipids Yes    Intervention Provide education and support for participant on nutrition & aerobic/resistive exercise along with prescribed medications to achieve LDL 70mg , HDL >40mg .    Expected Outcomes Short Term: Participant states understanding of desired cholesterol values and is compliant with medications prescribed. Participant is following exercise prescription and nutrition guidelines.;Long Term: Cholesterol controlled with medications as prescribed, with  individualized exercise RX and with personalized nutrition plan. Value goals: LDL <  70mg , HDL > 40 mg.             Education:Diabetes - Individual verbal and written instruction to review signs/symptoms of diabetes, desired ranges of glucose level fasting, after meals and with exercise. Acknowledge that pre and post exercise glucose checks will be done for 3 sessions at entry of program.   Core Components/Risk Factors/Patient Goals Review:   Goals and Risk Factor Review     Row Name 10/27/22 1013 11/24/22 0939 12/15/22 0949         Core Components/Risk Factors/Patient Goals Review   Personal Goals Review Weight Management/Obesity;Lipids Lipids;Weight Management/Obesity Lipids;Weight Management/Obesity     Review Patient reports that he takes all his meds without any probelems and follows up with MD any concerns arise. His weight has been maintained. Patient reports that he takes all meds and follows up with his doctor with appropriate blood work to manage cholesterol and lipids. His weight is fairly steady and he continues to monitor his weight. Patient reports his weight has been steady. He also continues to take all cholesterol meds as prescirbed. He just recently went to his doctor for lab work and all his levels were within acceptable ranges. He will continue to follow up with his doctor to monitor his cholesterol and lipid levels.     Expected Outcomes Short: continue to attend cardiac rehab for risk factor reduction. Continue to take all meds and manage cholesterol with guidance from MD. Long: maintain healthy weight and control cardiac risk factos. Short: continue to attend cardiac rehab classes and take meds as prescribed. Long: continue to control cardiac risk factors with healthy lifestyle choices and medication management. Short: continue to attend cardiac rehab classes and take meds as prescribed. Long: continue to control cardiac risk factors with healthy lifestyle choices and  medication management.              Core Components/Risk Factors/Patient Goals at Discharge (Final Review):   Goals and Risk Factor Review - 12/15/22 0949       Core Components/Risk Factors/Patient Goals Review   Personal Goals Review Lipids;Weight Management/Obesity    Review Patient reports his weight has been steady. He also continues to take all cholesterol meds as prescirbed. He just recently went to his doctor for lab work and all his levels were within acceptable ranges. He will continue to follow up with his doctor to monitor his cholesterol and lipid levels.    Expected Outcomes Short: continue to attend cardiac rehab classes and take meds as prescribed. Long: continue to control cardiac risk factors with healthy lifestyle choices and medication management.             ITP Comments:  ITP Comments     Row Name 09/11/22 1341 09/15/22 1734 09/22/22 1054 10/01/22 1038 10/29/22 1213   ITP Comments Initial phone call completed. Diagnosis can be found in [facility] 7/[facility]. EP Orientation scheduled for Monday 8/19 at 2:30. Completed and gym orientation. Initial ITP created and sent for review to Dr. Firman Hughes, Medical Director. First full day of exercise!  Patient was oriented to gym and equipment including functions, settings, policies, and procedures.  Patient's individual exercise prescription and treatment plan were reviewed.  All starting workloads were established based on the results of the 6 minute walk test done at initial orientation visit.  The plan for exercise progression was also introduced and progression will be customized based on patient's performance and goals. 30 Day review completed. Medical Director ITP review done,  changes made as directed, and signed approval by Medical Director.     new to program 30 Day review completed. Medical Director ITP review done, changes made as directed, and signed approval by Medical Director.    Row Name 11/26/22 1303 12/17/22 1152  01/14/23 1013 02/11/23 1119     ITP Comments 30 Day review completed. Medical Director ITP review done, changes made as directed, and signed approval by Medical Director. 30 Day review completed. Medical Director ITP review done, changes made as directed, and signed approval by Medical Director. 30 Day review completed. Medical Director ITP review done, changes made as directed, and signed approval by Medical Director. Chia graduated today from  rehab with 36 sessions completed.  Details of the patient's exercise prescription and what He needs to do in order to continue the prescription and progress were discussed with patient.  Patient was given a copy of prescription and goals.  Patient verbalized understanding. Willie Griffin plans to continue to exercise by exercising at home.             Comments: Discharge ITTP

## 2023-02-15 ENCOUNTER — Other Ambulatory Visit: Payer: Self-pay | Admitting: Internal Medicine

## 2023-04-13 ENCOUNTER — Encounter: Payer: Self-pay | Admitting: Cardiology

## 2023-04-13 ENCOUNTER — Ambulatory Visit: Payer: Medicare Other | Attending: Cardiology | Admitting: Cardiology

## 2023-04-13 VITALS — BP 122/65 | HR 55 | Ht 67.0 in | Wt 181.2 lb

## 2023-04-13 DIAGNOSIS — R5383 Other fatigue: Secondary | ICD-10-CM | POA: Insufficient documentation

## 2023-04-13 DIAGNOSIS — I2510 Atherosclerotic heart disease of native coronary artery without angina pectoris: Secondary | ICD-10-CM | POA: Diagnosis not present

## 2023-04-13 DIAGNOSIS — R400 Somnolence: Secondary | ICD-10-CM | POA: Diagnosis not present

## 2023-04-13 DIAGNOSIS — E7800 Pure hypercholesterolemia, unspecified: Secondary | ICD-10-CM | POA: Diagnosis not present

## 2023-04-13 NOTE — Patient Instructions (Signed)
 Medication Instructions:  No changes at this time.   *If you need a refill on your cardiac medications before your next appointment, please call your pharmacy*   Lab Work: None  If you have labs (blood work) drawn today and your tests are completely normal, you will receive your results only by: MyChart Message (if you have MyChart) OR A paper copy in the mail If you have any lab test that is abnormal or we need to change your treatment, we will call you to review the results.   Testing/Procedures: None   Follow-Up: At [facility], you and your health needs are our priority.  As part of our continuing mission to provide you with exceptional heart care, we have created designated Provider Care Teams.  These Care Teams include your primary Cardiologist (physician) and Advanced Practice Providers (APPs -  Physician Assistants and Nurse Practitioners) who all work together to provide you with the care you need, when you need it.   Your next appointment:   6 month(s)  Provider:   Debbe Odea, MD   Other Instructions Referral placed to see pulmonary for your Obstructive Sleep Apnea. Their number is [phone]

## 2023-04-13 NOTE — Progress Notes (Signed)
 Cardiology Office Note:    Date:  04/13/2023   ID:  Willie Griffin, DOB 11/25/1955, MRN [id number]  PCP:  Karie Schwalbe, MD  [facility] Cardiologist:  Debbe Odea, MD  [facility] Electrophysiologist:  None   Referring MD: Karie Schwalbe, MD   No chief complaint on file.   History of Present Illness:    Willie Griffin is a 68 y.o. male with a hx of CAD (s/p PCI x 2 to LCx, 50% mid LAD in 7/24), hyperlipidemia, GERD, hypothyroidism who presents for follow-up   Denies chest pain or shortness of breath, walks for 2 miles 3 times weekly.  Also plays softball.  Endorses easy bruisability with trauma.  Compliant with aspirin, Plavix, Lipitor as prescribed.  Endorses daytime fatigue, somnolence.  Sleep for about 7 hours.  Feels like he snores when he lays head on his right side.  Pushing his lower jaw out words improved symptoms.   Prior notes Coronary calcium score 01/2020 was 112, 62nd percentile. Echocardiogram 12/2019 showed normal systolic and diastolic function.    Past Medical History:  Diagnosis Date   Allergy    allergic rhinitis   GERD (gastroesophageal reflux disease)    Hemorrhoid    Hypothyroidism    Peyronie disease     Past Surgical History:  Procedure Laterality Date   COLONOSCOPY     CORONARY STENT INTERVENTION N/A 08/21/2022   Procedure: CORONARY STENT INTERVENTION;  Surgeon: Iran Ouch, MD;  Location: [facility] INVASIVE CV LAB;  Service: Cardiovascular;  Laterality: N/A;   INGUINAL HERNIA REPAIR  9/05   LEFT HEART CATH AND CORONARY ANGIOGRAPHY Left 08/21/2022   Procedure: LEFT HEART CATH AND CORONARY ANGIOGRAPHY;  Surgeon: Iran Ouch, MD;  Location: [facility] INVASIVE CV LAB;  Service: Cardiovascular;  Laterality: Left;   MOHS SURGERY     nasal SCC----[facility]   TOENAIL EXCISION     partial   TONSILLECTOMY  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   aspirin EC 81 MG tablet Take 1 tablet (81 mg total) by mouth daily. Swallow whole.   clopidogrel (PLAVIX) 75 MG tablet Take 1 tablet (75 mg total) by mouth daily with breakfast.   Cyanocobalamin (VITAMIN B-12 PO) Take 2 Doses by mouth daily.   EPIPEN 2-PAK 0.3 MG/0.3ML SOAJ injection INJECT 0.3 MG INTRAMUSCULARLY ONCE AS NEEDED FOR UP TO 1 DOSE   hydrocortisone 2.5 % cream Use rectally twice daily as neeed   levothyroxine (SYNTHROID) 25 MCG tablet TAKE 1 TABLET BY MOUTH EVERY DAY BEFORE BREAKFAST   OVER THE COUNTER MEDICATION Take 2 capsules by mouth daily. Synoviox includes MSM, turmeric, and hyaluronic acid   pantoprazole (PROTONIX) 40 MG tablet TAKE 1 TABLET BY MOUTH EVERY DAY   SOOLANTRA 1 % CREA Apply topically daily. to face   tadalafil (CIALIS) 20 MG tablet Take 0.5-1 tablets (10-20 mg total) by mouth every other day as needed for erectile dysfunction.     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 No pets inside. Outdoor cats.      No living will   Not sure about health care POA--probably children   Would want resuscitation attempts   Would accept tube feeds--but not if  cognitively unaware   Social Drivers of Health   Financial Resource Strain: Patient Declined (02/03/2023)   Overall Financial Resource Strain (CARDIA)    Difficulty of Paying Living Expenses: Patient declined  Food Insecurity: Patient Declined (02/03/2023)   Hunger Vital Sign    Worried About Running Out of Food in the Last Year: Patient declined    Ran Out of Food in the Last Year: Patient declined  Transportation Needs: Patient Declined (02/03/2023)   PRAPARE - Administrator, Civil Service (Medical): Patient declined    Lack of Transportation (Non-Medical): Patient declined  Physical Activity: Unknown (02/03/2023)   Exercise Vital Sign    Days of Exercise per Week: Patient declined    Minutes of Exercise per Session: Not on file  Stress: Patient Declined (02/03/2023)   [employer] of Occupational Health - Occupational Stress Questionnaire    Feeling of Stress : Patient declined  Social Connections: Unknown (02/03/2023)   Social Connection and Isolation Panel [NHANES]    Frequency of Communication with Friends and Family: Patient declined    Frequency of Social Gatherings with Friends and Family: Patient declined    Attends Religious Services: Patient declined    Database administrator or Organizations: Patient declined    Attends Engineer, structural: Not on file    Marital Status: Patient declined     Family History: The patient's family history includes Arthritis in his mother; Breast cancer in his paternal grandmother and another family member; Cancer in his paternal aunt and sister; Celiac disease in his paternal uncle; Coronary artery disease in his maternal grandfather and paternal grandfather; Dementia in his father; Heart attack in his father and mother; Heart disease in his father; Hodgkin's lymphoma in his sister; Other in his paternal uncle and sister; Stroke in his maternal grandmother; Suicidality in his paternal grandmother. There is no history of  Diabetes, Hypertension, Colon polyps, Colon cancer, Rectal cancer, Stomach cancer, or Esophageal cancer.  ROS:   Please see the history of present illness.     All other systems reviewed and are negative.  EKGs/Labs/Other Studies Reviewed:    The following studies were reviewed today:   EKG:  EKG not ordered today.   Recent Labs: 12/09/2022: ALT 17; BUN 18; Creatinine, Ser 1.11; Hemoglobin 14.5; Platelets 258.0; Potassium 4.4; Sodium 141; TSH 1.06  Recent Lipid Panel    Component Value Date/Time   CHOL 111 12/09/2022 0926   TRIG 62.0 12/09/2022 0926   HDL 39.90 12/09/2022 0926   CHOLHDL 3 12/09/2022 0926   VLDL 12.4 12/09/2022 0926   LDLCALC 58 12/09/2022 0926     Risk Assessment/Calculations:      Physical Exam:  VS:  BP 122/65 (BP Location: Left Arm, Patient Position: Sitting, Cuff Size: Normal)   Pulse (!) 55   Ht 5\' 7"  (1.702 m)   Wt 181 lb 3.2 oz (82.2 kg)   SpO2 96%   BMI 28.38 kg/m     Wt Readings from Last 3 Encounters:  04/13/23 181 lb 3.2 oz (82.2 kg)  02/09/23 179 lb (81.2 kg)  01/23/23 178 lb (80.7 kg)     GEN:  Well nourished, well developed in no acute distress HEENT: Normal NECK: No JVD; No carotid bruits CARDIAC: RRR, no murmurs, rubs, gallops RESPIRATORY:  Clear to auscultation without rales, wheezing or rhonchi  ABDOMEN: Soft, non-tender, non-distended MUSCULOSKELETAL:  No edema; left lower extremity varicose veins noted SKIN: Warm and dry NEUROLOGIC:  Alert and oriented x 3 PSYCHIATRIC:  Normal affect   ASSESSMENT:    1. Coronary artery disease involving native coronary artery of native heart, unspecified whether angina present   2. Pure hypercholesterolemia   3. Fatigue, unspecified type   4. Somnolence     PLAN:    In order of problems listed above:  CAD, s/p PCI x 2 Lcx, 50% mid LAD 7/24.  Continue aspirin, Plavix, Lipitor 20 mg daily.  Can stop Plavix after 1 year from stent placement due to easy bruisability.  EF 60 to  65% Hyperlipidemia, cholesterol controlled continue Lipitor 20 mg daily.   Patient endorses daytime fatigue, somnolence.  Findings suggest OSA.  Refer to sleep specialist for additional input.  Follow-up in 6 months.   Shared Decision Making/Informed Consent       Medication Adjustments/Labs and Tests Ordered: Current medicines are reviewed at length with the patient today.  Concerns regarding medicines are outlined above.  Orders Placed This Encounter  Procedures   Ambulatory referral to Pulmonology   EKG 12-Lead   No orders of the defined types were placed in this encounter.   Patient Instructions  Medication Instructions:  No changes at this time.   *If you need a refill on your cardiac medications before your next appointment, please call your pharmacy*   Lab Work: None  If you have labs (blood work) drawn today and your tests are completely normal, you will receive your results only by: MyChart Message (if you have MyChart) OR A paper copy in the mail If you have any lab test that is abnormal or we need to change your treatment, we will call you to review the results.   Testing/Procedures: None   Follow-Up: At [facility], you and your health needs are our priority.  As part of our continuing mission to provide you with exceptional heart care, we have created designated Provider Care Teams.  These Care Teams include your primary Cardiologist (physician) and Advanced Practice Providers (APPs -  Physician Assistants and Nurse Practitioners) who all work together to provide you with the care you need, when you need it.   Your next appointment:   6 month(s)  Provider:   Debbe Odea, MD   Other Instructions Referral placed to see pulmonary for your Obstructive Sleep Apnea. Their number is [phone]       Signed, Debbe Odea, MD  04/13/2023 11:36 AM    [facility]

## 2023-04-14 ENCOUNTER — Ambulatory Visit: Admitting: Sleep Medicine

## 2023-04-14 ENCOUNTER — Encounter: Payer: Self-pay | Admitting: Sleep Medicine

## 2023-04-14 VITALS — BP 112/60 | HR 68 | Resp 16 | Ht 67.0 in | Wt 181.0 lb

## 2023-04-14 DIAGNOSIS — F5112 Insufficient sleep syndrome: Secondary | ICD-10-CM

## 2023-04-14 DIAGNOSIS — I25810 Atherosclerosis of coronary artery bypass graft(s) without angina pectoris: Secondary | ICD-10-CM | POA: Diagnosis not present

## 2023-04-14 DIAGNOSIS — G4733 Obstructive sleep apnea (adult) (pediatric): Secondary | ICD-10-CM

## 2023-04-14 NOTE — Patient Instructions (Signed)
 Marland Kitchen

## 2023-04-14 NOTE — Progress Notes (Signed)
 Name:Willie Griffin MRN: [id number] DOB: 1955/09/28   CHIEF COMPLAINT:  EXCESSIVE DAYTIME SLEEPINESS   HISTORY OF PRESENT ILLNESS:  Willie Griffin is a 68 y.o. w/ a h/o CAD and hypothyroidism who presents for c/o loud snoring, witnessed apnea and excessive daytime sleepiness which has been present for a few months. Reports nocturnal awakenings due to nocturia, however does not have difficulty falling back to sleep. Denies any significant weight changes. Denies morning headaches, night sweats, RLS symptoms, dream enactment, cataplexy, hypnagogic or hypnapompic hallucinations. Reports a family history of sleep apnea. Reports drowsy driving. Drinks 1-2 sodas a few days per week, denies alcohol, tobacco or illicit drug use. 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   Bedtime 10-11 pm Sleep onset 10 mins Rise time 5 am   EPWORTH SLEEP SCORE 6    04/14/2023   10:14 AM  Results of the Epworth flowsheet  Sitting and reading 1  Watching TV 2  Sitting, inactive in a public place (e.g. a theatre or a meeting) 1  As a passenger in a car for an hour without a break 0  Lying down to rest in the afternoon when circumstances permit 2  Sitting and talking to someone 0  Sitting quietly after a lunch without alcohol 0  In a car, while stopped for a few minutes in traffic 0  Total score 6     PAST MEDICAL HISTORY :   has a past medical history of Allergy, GERD (gastroesophageal reflux disease), Hemorrhoid, Hyperlipidemia, Hypothyroidism, and Peyronie disease.  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 Prior to Admission medications   Medication Sig Start Date End Date Taking? Authorizing Provider  aspirin EC  81 MG tablet Take 1 tablet (81 mg total) by mouth daily. Swallow whole. 02/28/20  Yes Debbe Odea, MD  clopidogrel (PLAVIX) 75 MG tablet Take 1 tablet (75 mg total) by mouth daily with breakfast. 08/22/22  Yes Dunn, Raymon Mutton, PA-C  Cyanocobalamin (VITAMIN B-12 PO) Take 2 Doses by mouth daily.   Yes [provider]  EPIPEN 2-PAK 0.3 MG/0.3ML SOAJ injection INJECT 0.3 MG INTRAMUSCULARLY ONCE AS NEEDED FOR UP TO 1 DOSE 12/03/21  Yes Tillman Abide I, MD  hydrocortisone 2.5 % cream Use rectally twice daily as neeed 12/09/22  Yes Karie Schwalbe, MD  levothyroxine (SYNTHROID) 25 MCG tablet TAKE 1 TABLET BY MOUTH EVERY DAY BEFORE BREAKFAST 09/18/22  Yes Karie Schwalbe, MD  OVER THE COUNTER MEDICATION Take 2 capsules by mouth daily. Synoviox includes MSM, turmeric, and hyaluronic acid   Yes [provider]  pantoprazole (PROTONIX) 40 MG tablet TAKE 1 TABLET BY MOUTH EVERY DAY 02/16/23  Yes Karie Schwalbe, MD  SOOLANTRA 1 % CREA Apply topically daily. to face 01/15/17  Yes [provider]  tadalafil (CIALIS) 20 MG tablet Take 0.5-1 tablets (10-20 mg total) by mouth every other day as needed for erectile dysfunction. 10/07/22  Yes Karie Schwalbe, MD  atorvastatin (LIPITOR) 20 MG tablet Take 1 tablet (20 mg total) by mouth daily. 08/15/22 12/28/22  Debbe Odea, MD   Allergies  Allergen Reactions   Bee Venom Other (See Comments)   Ciprofloxacin Other (See Comments)    Sensations like hot and cold are markedly pronounced and  dermatologist told him he may be allergic.    FAMILY HISTORY:  family history includes Arthritis in his mother; Breast cancer in his paternal grandmother and another family member; Cancer in his paternal aunt and sister; Celiac disease in his paternal uncle; Coronary artery disease in his maternal grandfather and paternal grandfather; Dementia in his father; Heart attack in his father and mother; Heart disease in his father; Hodgkin's lymphoma in  his sister; Other in his paternal uncle and sister; Stroke in his maternal grandmother; Suicidality in his paternal grandmother. SOCIAL HISTORY:  reports that he has never smoked. He has been exposed to tobacco smoke. He has never used smokeless tobacco. He reports that he does not drink alcohol and does not use drugs.   Review of Systems:  Gen:  Denies  fever, sweats, chills weight loss  HEENT: Denies blurred vision, double vision, ear pain, eye pain, hearing loss, nose bleeds, sore throat Cardiac:  No dizziness, chest pain or heaviness, chest tightness,edema, No JVD Resp:   No cough, -sputum production, -shortness of breath,-wheezing, -hemoptysis,  Gi: Denies swallowing difficulty, stomach pain, nausea or vomiting, diarrhea, constipation, bowel incontinence Gu:  Denies bladder incontinence, burning urine Ext:   Denies Joint pain, stiffness or swelling Skin: Denies  skin rash, easy bruising or bleeding or hives Endoc:  Denies polyuria, polydipsia , polyphagia or weight change Psych:   Denies depression, insomnia or hallucinations  Other:  All other systems negative  VITAL SIGNS: BP 112/60   Pulse 68   Resp 16   Ht 5\' 7"  (1.702 m)   Wt 181 lb (82.1 kg)   SpO2 96%   BMI 28.35 kg/m    Physical Examination:   General Appearance: No distress  EYES PERRLA, EOM intact.   NECK Supple, No JVD Pulmonary: normal breath sounds, No wheezing.  CardiovascularNormal S1,S2.  No m/r/g.   Abdomen: Benign, Soft, non-tender. Skin:   warm, no rashes, no ecchymosis  Extremities: normal, no cyanosis, clubbing. Neuro:without focal findings,  speech normal  PSYCHIATRIC: Mood, affect within normal limits.   ASSESSMENT AND PLAN  OSA I suspect that OSA is likely present due to clinical presentation. Discussed the consequences of untreated sleep apnea. Advised not to drive drowsy for safety of patient and others. Will complete further evaluation with a home sleep study and follow up to review  results.    CAD Stable, on current management. Following with Cardiology.   Insufficient sleep syndrome Counseled patient on the importance of sleeping 7-8 hours per night. Strongly advised not to drive drowsy for safety of patient and others.     MEDICATION ADJUSTMENTS/LABS AND TESTS ORDERED: Recommend Sleep Study   Patient  satisfied with Plan of action and management. All questions answered  Follow up to review HST results and treatment plan.   I spent a total of 31 minutes reviewing chart data, face-to-face evaluation with the patient, counseling and coordination of care as detailed above.    Tempie Hoist, M.D.  Sleep Medicine [facility]

## 2023-04-24 ENCOUNTER — Encounter

## 2023-04-24 DIAGNOSIS — G4733 Obstructive sleep apnea (adult) (pediatric): Secondary | ICD-10-CM

## 2023-05-13 DIAGNOSIS — G4733 Obstructive sleep apnea (adult) (pediatric): Secondary | ICD-10-CM | POA: Diagnosis not present

## 2023-05-19 ENCOUNTER — Encounter: Payer: Self-pay | Admitting: Sleep Medicine

## 2023-05-19 ENCOUNTER — Ambulatory Visit: Admitting: Sleep Medicine

## 2023-05-19 VITALS — BP 140/80 | HR 63 | Ht 67.0 in | Wt 177.6 lb

## 2023-05-19 DIAGNOSIS — I25810 Atherosclerosis of coronary artery bypass graft(s) without angina pectoris: Secondary | ICD-10-CM

## 2023-05-19 DIAGNOSIS — Z7722 Contact with and (suspected) exposure to environmental tobacco smoke (acute) (chronic): Secondary | ICD-10-CM

## 2023-05-19 DIAGNOSIS — G4733 Obstructive sleep apnea (adult) (pediatric): Secondary | ICD-10-CM | POA: Diagnosis not present

## 2023-05-19 NOTE — Progress Notes (Signed)
 Name:Willie Griffin MRN: [id number] DOB: 24-Nov-1955   CHIEF COMPLAINT:  HST F/U   HISTORY OF PRESENT ILLNESS:  Willie Griffin is a 68 y.o. w/ a h/o CAD and hypothyroidism who presents to follow up on HST results. The patient underwent HST which revealed mild OSA (AHI 8.2, O2 nadir 88%).     EPWORTH SLEEP SCORE    04/14/2023   10:14 AM  Results of the Epworth flowsheet  Sitting and reading 1  Watching TV 2  Sitting, inactive in a public place (e.g. a theatre or a meeting) 1  As a passenger in a car for an hour without a break 0  Lying down to rest in the afternoon when circumstances permit 2  Sitting and talking to someone 0  Sitting quietly after a lunch without alcohol 0  In a car, while stopped for a few minutes in traffic 0  Total score 6     PAST MEDICAL HISTORY :   has a past medical history of Allergy, GERD (gastroesophageal reflux disease), Hemorrhoid, Hyperlipidemia, Hypothyroidism, and Peyronie disease.  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 Prior to Admission medications   Medication Sig Start Date End Date Taking? Authorizing Provider  aspirin  EC 81 MG tablet Take 1 tablet (81 mg total) by mouth daily. Swallow whole. 02/28/20  Yes Constancia Delton, MD  clopidogrel  (PLAVIX ) 75 MG tablet Take 1 tablet (75 mg total) by mouth daily with breakfast. 08/22/22  Yes Dunn, Elvia Hammans, PA-C  Cyanocobalamin (VITAMIN B-12 PO) Take 2 Doses by mouth daily.   Yes [provider]  EPIPEN  2-PAK 0.3 MG/0.3ML SOAJ injection INJECT 0.3 MG INTRAMUSCULARLY ONCE AS NEEDED FOR UP TO 1 DOSE 12/03/21  Yes Curt Dover I, MD  hydrocortisone  2.5 % cream Use rectally twice daily as neeed 12/09/22  Yes Letvak, Richard I, MD   levothyroxine  (SYNTHROID ) 25 MCG tablet TAKE 1 TABLET BY MOUTH EVERY DAY BEFORE BREAKFAST 09/18/22  Yes Helaine Llanos, MD  OVER THE COUNTER MEDICATION Take 2 capsules by mouth daily. Synoviox includes MSM, turmeric, and hyaluronic acid   Yes [provider]  pantoprazole  (PROTONIX ) 40 MG tablet TAKE 1 TABLET BY MOUTH EVERY DAY 02/16/23  Yes Letvak, Richard I, MD  SOOLANTRA 1 % CREA Apply topically daily. to face 01/15/17  Yes [provider]  tadalafil  (CIALIS ) 20 MG tablet Take 0.5-1 tablets (10-20 mg total) by mouth every other day as needed for erectile dysfunction. 10/07/22  Yes Helaine Llanos, MD  atorvastatin  (LIPITOR) 20 MG tablet Take 1 tablet (20 mg total) by mouth daily. 08/15/22 12/28/22  Constancia Delton, MD   Allergies  Allergen Reactions   Bee Venom Other (See Comments)   Ciprofloxacin Other (See Comments)    Sensations like hot and cold are markedly pronounced and dermatologist told him he may be allergic.    FAMILY HISTORY:  family history includes Arthritis in his mother; Breast cancer in his paternal grandmother and another family member; Cancer in his paternal aunt and sister; Celiac disease in his paternal uncle; Coronary artery disease in his maternal grandfather and paternal grandfather; Dementia in his father; Heart attack in his father and mother; Heart disease in his father; Hodgkin's lymphoma in his sister; Other in his paternal uncle and sister; Stroke in his  maternal grandmother; Suicidality in his paternal grandmother. SOCIAL HISTORY:  reports that he has never smoked. He has been exposed to tobacco smoke. He has never used smokeless tobacco. He reports that he does not drink alcohol and does not use drugs.   Review of Systems:  Gen:  Denies  fever, sweats, chills weight loss  HEENT: Denies blurred vision, double vision, ear pain, eye pain, hearing loss, nose bleeds, sore throat Cardiac:  No dizziness, chest pain or heaviness, chest  tightness,edema, No JVD Resp:   No cough, -sputum production, -shortness of breath,-wheezing, -hemoptysis,  Gi: Denies swallowing difficulty, stomach pain, nausea or vomiting, diarrhea, constipation, bowel incontinence Gu:  Denies bladder incontinence, burning urine Ext:   Denies Joint pain, stiffness or swelling Skin: Denies  skin rash, easy bruising or bleeding or hives Endoc:  Denies polyuria, polydipsia , polyphagia or weight change Psych:   Denies depression, insomnia or hallucinations  Other:  All other systems negative  VITAL SIGNS: BP (!) 140/80   Pulse 63   Ht 5\' 7"  (1.702 m)   Wt 177 lb 9.6 oz (80.6 kg)   SpO2 97%   BMI 27.82 kg/m    Physical Examination:   General Appearance: No distress  EYES PERRLA, EOM intact.   NECK Supple, No JVD Pulmonary: normal breath sounds, No wheezing.  CardiovascularNormal S1,S2.  No m/r/g.   Abdomen: Benign, Soft, non-tender. Skin:   warm, no rashes, no ecchymosis  Extremities: normal, no cyanosis, clubbing. Neuro:without focal findings,  speech normal  PSYCHIATRIC: Mood, affect within normal limits.   ASSESSMENT AND PLAN  OSA Reviewed HST results with patient. Starting on APAP therapy set to 4-16 cm H2O. Discussed the consequences of untreated sleep apnea. Advised not to drive drowsy for safety of patient and others. Will follow up in 3 months to review CPAP efficacy and compliance data.    CAD Stable, on current management. Following with cardiology.    Patient  satisfied with Plan of action and management. All questions answered  I spent a total of 21 minutes reviewing chart data, face-to-face evaluation with the patient, counseling and coordination of care as detailed above.    Bonnie Roig, M.D.  Sleep Medicine [facility]

## 2023-05-19 NOTE — Patient Instructions (Signed)

## 2023-06-01 ENCOUNTER — Telehealth: Payer: Self-pay | Admitting: Sleep Medicine

## 2023-06-01 NOTE — Telephone Encounter (Signed)
 Tammy Gamble with [facility] called and left a message stating they tried to push this patient cpap order through but his insurance kicked it back patient has Medicare and per their guidelines they use the 4% scoring AHI which is only 1.9.  They will not cover the equipment with an AHI under 5.  We can call the patient and offer private pay? please advise thank you  Tammy also mentioned that you were thinking about ordering in lab sleep study

## 2023-06-08 DIAGNOSIS — G4733 Obstructive sleep apnea (adult) (pediatric): Secondary | ICD-10-CM

## 2023-06-08 NOTE — Telephone Encounter (Signed)
 Contacted pt via MyChart relaying note from Dr. Kieran Pellet and inquiring as to which he would prefer.

## 2023-07-25 ENCOUNTER — Other Ambulatory Visit: Payer: Self-pay

## 2023-07-25 ENCOUNTER — Emergency Department
Admission: EM | Admit: 2023-07-25 | Discharge: 2023-07-25 | Disposition: A | Discharge status: Home / Self Care | Attending: Emergency Medicine | Admitting: Emergency Medicine

## 2023-07-25 ENCOUNTER — Emergency Department

## 2023-07-25 DIAGNOSIS — M7918 Myalgia, other site: Secondary | ICD-10-CM | POA: Insufficient documentation

## 2023-07-25 MED ORDER — IBUPROFEN 600 MG PO TABS: 600.0000 mg | ORAL_TABLET | Freq: Three times a day (TID) | ORAL | 0 refills | Status: DC

## 2023-07-25 MED ORDER — METHOCARBAMOL 500 MG PO TABS: ORAL_TABLET | ORAL | 0 refills | Status: DC

## 2023-07-25 MED ORDER — HYDROCODONE-ACETAMINOPHEN 5-325 MG PO TABS: 1.0000 | ORAL_TABLET | Freq: Four times a day (QID) | ORAL | 0 refills | Status: DC | PRN

## 2023-07-25 NOTE — Discharge Instructions (Addendum)
 Follow-up with your provider if any continued problems or concerns.  Prescriptions were sent to your pharmacy to begin taking.  Do not take these medications and drive or operate machinery.  The hydrocodone is a narcotic and the methocarbamol  is a muscle relaxant which may make you drowsy and increase your risk for falling.  Ibuprofen is a very 8 hours with food.  You may use ice or heat to your back as needed for discomfort.

## 2023-07-25 NOTE — ED Notes (Signed)
 Pt resting in bed, lights dimmed per request, states pain is a little better than when he first came in.

## 2023-07-25 NOTE — ED Provider Notes (Signed)
 [facility] Provider Note    Event Date/Time   First MD Initiated Contact with Patient 07/25/23 [phone]     (approximate)   History   Back Pain   HPI  Willie Griffin is a 68 y.o. male   presents to the ED with complaint of upper and lower back pain.  Patient states no injury that he is aware of.  Has a history of having a tumor removed from his back but does not know what kind this was and this was many years ago.  He denies any urinary symptoms, radiculopathy, incontinence of bowel or bladder or saddle anesthesias.  Patient denies any tick bites.  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      Physical Exam   Triage Vital Signs: ED Triage Vitals  Encounter Vitals Group     BP 07/25/23 0837 (!) 147/73     Girls Systolic BP Percentile --      Girls Diastolic BP Percentile --      Boys Systolic BP Percentile --      Boys Diastolic BP Percentile --      Pulse Rate 07/25/23 0836 71     Resp 07/25/23 0836 18     Temp 07/25/23 0836 98.3 F (36.8 C)     Temp Source 07/25/23 0836 Oral     SpO2 07/25/23 0836 100 %     Weight 07/25/23 0837 177 lb 11.1 oz (80.6 kg)     Height 07/25/23 0837 5' 7 (1.702 m)     Head Circumference --      Peak Flow --      Pain Score 07/25/23 0837 8     Pain Loc --      Pain Education --      Exclude from Growth Chart --     Most recent vital signs: Vitals:   07/25/23 0836 07/25/23 0837  BP:  (!) 147/73  Pulse: 71   Resp: 18   Temp: 98.3 F (36.8 C)   SpO2: 100%      General: Awake, no distress.  CV:  Good peripheral perfusion.  Heart regular rate rhythm. Resp:  Normal effort.  Lungs are clear bilaterally. Abd:  No distention.  Other:  There is diffuse tenderness on palpation of the lower thoracic and lumbar spine on palpation.  Minimal bilateral  paravertebral muscle tenderness however patient is limited in range of motion secondary to muscle spasms.  Patient is able to stand and ambulate without any assistance.  No gross deformity is noted.  There is a well-healed surgical scar in the lower lumbar region.   ED Results / Procedures / Treatments   Labs (all labs ordered are listed, but only abnormal results are displayed) Labs Reviewed - No data to display   RADIOLOGY Thoracic spine x-ray images reviewed by myself independent of the radiologist and no compression fracture or malalignment was noted.  Radiology report also for no acute bony abnormality with mild curvature of the mid thoracic spine and mild degenerative changes.  Lumbar spine x-ray images were reviewed by myself independent the radiologist of no fracture or malalignment was noted.  Official radiology report mentions disc space narrowing at L3-L4 and L4-L5.   PROCEDURES:  Critical Care performed:   Procedures   MEDICATIONS ORDERED IN ED: Medications - No data to display   IMPRESSION / MDM /  ASSESSMENT AND PLAN / ED COURSE  I reviewed the triage vital signs and the nursing notes.   Differential diagnosis includes, but is not limited to, lumbosacral pain, strain, compression fracture, degenerative disc disease, cauda equina unlikely but considered, lumbar spine evaluation due to patient's history of a tumor removed at an unknown year.  68 year old male presents to the ED with complaint of back pain without history of injury recently.  Patient states pain started yesterday without any symptoms of urinary tract infection or radiculopathy.  Patient declined an offer of Toradol .  We discussed his x-ray findings.  A prescription for hydrocodone, methocarbamol  and ibuprofen was sent to the pharmacy.  Patient is aware that he should use ice or heat to his muscles as needed for discomfort.  He is to follow-up with his primary care provider if any continued problems or  concerns.  He is to return to the emergency department if any severe worsening of his symptoms.      Patient's presentation is most consistent with acute complicated illness / injury requiring diagnostic workup.  FINAL CLINICAL IMPRESSION(S) / ED DIAGNOSES   Final diagnoses:  Musculoskeletal pain     Rx / DC Orders   ED Discharge Orders          Ordered    HYDROcodone-acetaminophen  (NORCO/VICODIN) 5-325 MG tablet  Every 6 hours PRN        07/25/23 1020    methocarbamol  (ROBAXIN ) 500 MG tablet        07/25/23 1020    ibuprofen (ADVIL) 600 MG tablet  3 times daily        07/25/23 1020             Note:  This document was prepared using Dragon voice recognition software and may include unintentional dictation errors.   Saunders Shona CROME, PA-C 07/25/23 1241    Arlander Charleston, MD 07/25/23 1352

## 2023-07-25 NOTE — ED Triage Notes (Signed)
 Pt here with back pain since yesterday. Pt states pain is lower, does not radiate. Pt denies fall on injury. Pt ambulatory to triage.

## 2023-08-13 ENCOUNTER — Other Ambulatory Visit: Payer: Self-pay | Admitting: Physician Assistant

## 2023-08-13 ENCOUNTER — Other Ambulatory Visit: Payer: Self-pay | Admitting: Cardiology

## 2023-08-18 ENCOUNTER — Ambulatory Visit: Admitting: Sleep Medicine

## 2023-08-18 NOTE — Congregational Nurse Program (Signed)
  Dept: [phone]   Congregational Nurse Program Note  Date of Encounter: 08/18/2023  Past Medical History: Past Medical History:  Diagnosis Date   Allergy    allergic rhinitis   GERD (gastroesophageal reflux disease)    Hemorrhoid    Hyperlipidemia    Hypothyroidism    Peyronie disease     Encounter Details:  Community Questionnaire - 08/18/23 1800       Questionnaire   Ask client: Do you give verbal consent for me to treat you today? Yes    Student Assistance N/A    Location Patient Served  [place]    Encounter Setting CN site    Population Status Unknown    Insurance Medicare    Insurance/Financial Assistance Referral N/A    Medication N/A    Medical Provider Yes    Screening Referrals Made N/A    Medical Referrals Made N/A    Medical Appointment Completed N/A    CNP Interventions Advocate/Support    Screenings CN Performed Blood Pressure    ED Visit Averted N/A    Life-Saving Intervention Made N/A          Today's Vitals   08/18/23 1759  BP: 122/79   There is no height or weight on file to calculate BMI.

## 2023-08-25 NOTE — Congregational Nurse Program (Signed)
  Dept: [phone]   Congregational Nurse Program Note  Date of Encounter: 08/25/2023  Past Medical History: Past Medical History:  Diagnosis Date   Allergy    allergic rhinitis   GERD (gastroesophageal reflux disease)    Hemorrhoid    Hyperlipidemia    Hypothyroidism    Peyronie disease     Encounter Details:  Community Questionnaire - 08/25/23 1807       Questionnaire   Ask client: Do you give verbal consent for me to treat you today? Yes    Student Assistance N/A    Location Patient Served  [facility]    Encounter Setting CN site    Population Status Unknown    Insurance Unknown    Insurance/Financial Assistance Referral N/A    Medication N/A    Medical Provider Yes    Screening Referrals Made N/A    Medical Referrals Made N/A    CNP Interventions Advocate/Support    ED Visit Averted N/A    Life-Saving Intervention Made N/A          Today's Vitals   08/25/23 1806  BP: 122/77   There is no height or weight on file to calculate BMI.

## 2023-09-03 ENCOUNTER — Encounter

## 2023-09-03 DIAGNOSIS — G4733 Obstructive sleep apnea (adult) (pediatric): Secondary | ICD-10-CM

## 2023-09-15 ENCOUNTER — Ambulatory Visit: Payer: Self-pay

## 2023-09-15 DIAGNOSIS — R0683 Snoring: Secondary | ICD-10-CM | POA: Diagnosis not present

## 2023-09-15 DIAGNOSIS — R069 Unspecified abnormalities of breathing: Secondary | ICD-10-CM | POA: Diagnosis not present

## 2023-09-24 ENCOUNTER — Encounter: Payer: Self-pay | Admitting: Sleep Medicine

## 2023-09-24 ENCOUNTER — Ambulatory Visit (INDEPENDENT_AMBULATORY_CARE_PROVIDER_SITE_OTHER): Admitting: Sleep Medicine

## 2023-09-24 VITALS — BP 120/60 | HR 82 | Temp 97.1°F | Ht 67.0 in | Wt 178.4 lb

## 2023-09-24 DIAGNOSIS — G4733 Obstructive sleep apnea (adult) (pediatric): Secondary | ICD-10-CM

## 2023-09-24 DIAGNOSIS — I25810 Atherosclerosis of coronary artery bypass graft(s) without angina pectoris: Secondary | ICD-10-CM

## 2023-09-24 NOTE — Patient Instructions (Signed)
 Willie Griffin

## 2023-09-24 NOTE — Progress Notes (Signed)
 Name:Willie Griffin MRN: [id number] DOB: 06-11-1955   CHIEF COMPLAINT:  HST F/U   HISTORY OF PRESENT ILLNESS: Willie Griffin is a 68 y.o. w/ a h/o CAD and hypothyroidism who presents to follow up on HST results. The patient underwent HST which revealed moderate OSA (AHI 21, O2 nadir 84%).     EPWORTH SLEEP SCORE    04/14/2023   10:14 AM  Results of the Epworth flowsheet  Sitting and reading 1  Watching TV 2  Sitting, inactive in a public place (e.g. a theatre or a meeting) 1  As a passenger in a car for an hour without a break 0  Lying down to rest in the afternoon when circumstances permit 2  Sitting and talking to someone 0  Sitting quietly after a lunch without alcohol 0  In a car, while stopped for a few minutes in traffic 0  Total score 6    PAST MEDICAL HISTORY :   has a past medical history of Allergy, GERD (gastroesophageal reflux disease), Hemorrhoid, Hyperlipidemia, Hypothyroidism, and Peyronie disease.  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 Prior to Admission medications   Medication Sig Start Date End Date Taking? Authorizing Provider  aspirin  EC 81 MG tablet Take 1 tablet (81 mg total) by mouth daily. Swallow whole. 02/28/20  Yes Agbor-Etang, Redell, MD  atorvastatin  (LIPITOR) 20 MG tablet TAKE 1 TABLET BY MOUTH EVERY DAY 08/13/23  Yes Agbor-Etang, Redell, MD  clopidogrel  (PLAVIX ) 75 MG tablet TAKE 1 TABLET BY MOUTH DAILY WITH BREAKFAST. 08/13/23  Yes Agbor-Etang, Redell, MD  Cyanocobalamin (VITAMIN B-12 PO) Take 2 Doses by mouth daily.   Yes [provider]  EPIPEN  2-PAK 0.3 MG/0.3ML SOAJ injection INJECT 0.3 MG INTRAMUSCULARLY ONCE AS NEEDED FOR UP TO 1 DOSE 12/03/21  Yes Jimmy Charlie FERNS, MD   HYDROcodone -acetaminophen  (NORCO/VICODIN) 5-325 MG tablet Take 1 tablet by mouth every 6 (six) hours as needed. 07/25/23 07/24/24 Yes Saunders Shona CROME, PA-C  hydrocortisone  2.5 % cream Use rectally twice daily as neeed 12/09/22  Yes Letvak, Richard I, MD  ibuprofen  (ADVIL ) 600 MG tablet Take 1 tablet (600 mg total) by mouth 3 (three) times daily. With food 07/25/23  Yes Summers, Rhonda L, PA-C  levothyroxine  (SYNTHROID ) 25 MCG tablet TAKE 1 TABLET BY MOUTH EVERY DAY BEFORE BREAKFAST 09/18/22  Yes Jimmy Charlie FERNS, MD  methocarbamol  (ROBAXIN ) 500 MG tablet 1-2 tablets every 6 hours prn muscle spasms 07/25/23  Yes Saunders, Rhonda L, PA-C  OVER THE COUNTER MEDICATION Take 2 capsules by mouth daily. Synoviox includes MSM, turmeric, and hyaluronic acid   Yes [provider]  pantoprazole  (PROTONIX ) 40 MG tablet TAKE 1 TABLET BY MOUTH EVERY DAY 02/16/23  Yes Letvak, Richard I, MD  SOOLANTRA 1 % CREA Apply topically daily. to face 01/15/17  Yes [provider]  tadalafil  (CIALIS ) 20 MG tablet Take 0.5-1 tablets (10-20 mg total) by mouth every other day as needed for erectile dysfunction. 10/07/22  Yes Jimmy Charlie FERNS, MD   Allergies  Allergen Reactions   Bee Venom Other (See Comments)   Ciprofloxacin Other (See Comments)    Sensations like hot and cold are markedly pronounced and dermatologist told him he may be allergic.    FAMILY HISTORY:  family history includes Arthritis in his mother;  Breast cancer in his paternal grandmother and another family member; Cancer in his paternal aunt and sister; Celiac disease in his paternal uncle; Coronary artery disease in his maternal grandfather and paternal grandfather; Dementia in his father; Heart attack in his father and mother; Heart disease in his father; Hodgkin's lymphoma in his sister; Other in his paternal uncle and sister; Stroke in his maternal grandmother; Suicidality in his paternal grandmother. SOCIAL HISTORY:  reports that he has  never smoked. He has been exposed to tobacco smoke. He has never used smokeless tobacco. He reports that he does not drink alcohol and does not use drugs.   Review of Systems:  Gen:  Denies  fever, sweats, chills weight loss  HEENT: Denies blurred vision, double vision, ear pain, eye pain, hearing loss, nose bleeds, sore throat Cardiac:  No dizziness, chest pain or heaviness, chest tightness,edema, No JVD Resp:   No cough, -sputum production, -shortness of breath,-wheezing, -hemoptysis,  Gi: Denies swallowing difficulty, stomach pain, nausea or vomiting, diarrhea, constipation, bowel incontinence Gu:  Denies bladder incontinence, burning urine Ext:   Denies Joint pain, stiffness or swelling Skin: Denies  skin rash, easy bruising or bleeding or hives Endoc:  Denies polyuria, polydipsia , polyphagia or weight change Psych:   Denies depression, insomnia or hallucinations  Other:  All other systems negative  VITAL SIGNS: BP 120/60   Pulse 82   Temp (!) 97.1 F (36.2 C)   Ht 5' 7 (1.702 m)   Wt 178 lb 6.4 oz (80.9 kg)   SpO2 97%   BMI 27.94 kg/m    Physical Examination:   General Appearance: No distress  EYES PERRLA, EOM intact.   NECK Supple, No JVD Pulmonary: normal breath sounds, No wheezing.  CardiovascularNormal S1,S2.  No m/r/g.   Abdomen: Benign, Soft, non-tender. Skin:   warm, no rashes, no ecchymosis  Extremities: normal, no cyanosis, clubbing. Neuro:without focal findings,  speech normal  PSYCHIATRIC: Mood, affect within normal limits.   ASSESSMENT AND PLAN  OSA Reviewed HST results with patient. Starting on APAP therapy set to 4-16 cm H2O. Discussed the consequences of untreated sleep apnea. Advised not to drive drowsy for safety of patient and others. Will follow up in 3 months.     CAD Stable, on current management. Following with PCP.    Patient satisfied with Plan of action and management. All questions answered  I spent a total of 36 minutes reviewing  chart data, face-to-face evaluation with the patient, counseling and coordination of care as detailed above.    Jessey Huyett, M.D.  Sleep Medicine [facility]

## 2023-10-01 ENCOUNTER — Telehealth: Payer: Self-pay | Admitting: Cardiology

## 2023-10-01 NOTE — Telephone Encounter (Signed)
 Called and spoke with patient. He reports that at last visit they discussed him coming back in for follow up and discussion of medications. He specifically stated that the discontinuation was in regards to stopping his blood thinners and changing over to daily aspirin . He reports bruising from the medications and would like appointment to discuss with provider. Scheduled next available 10/13 at 1:40 pm. He requested to also get placed on wait list if something should open up sooner. He was appreciative for the call back with no further questions or needs.

## 2023-10-01 NOTE — Telephone Encounter (Signed)
 Pt is concerned, states that he is noticing excessive bruising on his body that he is not used to. Pt is requesting an appt to see Dr. Budd to address these concerns asap, does not want to see an APP. Please advise if patient needs to be seen before Agbor's first available which is mid October. Thank you.

## 2023-10-19 ENCOUNTER — Other Ambulatory Visit: Payer: Self-pay

## 2023-10-19 MED ORDER — TADALAFIL 20 MG PO TABS: 10.0000 mg | ORAL_TABLET | ORAL | 1 refills | Status: DC | PRN

## 2023-10-19 NOTE — Telephone Encounter (Signed)
 Rx sent electronically.

## 2023-11-09 ENCOUNTER — Ambulatory Visit: Attending: Cardiology | Admitting: Cardiology

## 2023-11-09 ENCOUNTER — Encounter: Payer: Self-pay | Admitting: Sleep Medicine

## 2023-11-09 ENCOUNTER — Ambulatory Visit: Admitting: Sleep Medicine

## 2023-11-09 ENCOUNTER — Encounter: Payer: Self-pay | Admitting: Cardiology

## 2023-11-09 VITALS — BP 110/60 | HR 65 | Temp 98.3°F | Ht 67.0 in | Wt 178.0 lb

## 2023-11-09 VITALS — BP 100/50 | HR 59 | Ht 67.0 in | Wt 178.8 lb

## 2023-11-09 DIAGNOSIS — G4733 Obstructive sleep apnea (adult) (pediatric): Secondary | ICD-10-CM

## 2023-11-09 DIAGNOSIS — E7800 Pure hypercholesterolemia, unspecified: Secondary | ICD-10-CM | POA: Diagnosis present

## 2023-11-09 DIAGNOSIS — F5112 Insufficient sleep syndrome: Secondary | ICD-10-CM | POA: Diagnosis not present

## 2023-11-09 DIAGNOSIS — I2510 Atherosclerotic heart disease of native coronary artery without angina pectoris: Secondary | ICD-10-CM | POA: Insufficient documentation

## 2023-11-09 DIAGNOSIS — I25810 Atherosclerosis of coronary artery bypass graft(s) without angina pectoris: Secondary | ICD-10-CM

## 2023-11-09 MED ORDER — ATORVASTATIN CALCIUM 10 MG PO TABS: 10.0000 mg | ORAL_TABLET | Freq: Every day | ORAL | 3 refills | Status: AC

## 2023-11-09 NOTE — Patient Instructions (Signed)
 Medication Instructions:   Your physician recommends the following medication changes.  STOP TAKING: Plavix   DECREASE: Atorvastatin  (Lipitor) 10 mg once daily  *If you need a refill on your cardiac medications before your next appointment, please call your pharmacy*  Lab Work:  None ordered at this time   If you have labs (blood work) drawn today and your tests are completely normal, you will receive your results only by:  MyChart Message (if you have MyChart) OR  A paper copy in the mail If you have any lab test that is abnormal or we need to change your treatment, we will call you to review the results.  Testing/Procedures:  None ordered at this time   Referrals:  None ordered at this time   Follow-Up:  At [facility], you and your health needs are our priority.  As part of our continuing mission to provide you with exceptional heart care, our providers are all part of one team.  This team includes your primary Cardiologist (physician) and Advanced Practice Providers or APPs (Physician Assistants and Nurse Practitioners) who all work together to provide you with the care you need, when you need it.  Your next appointment:   6 month(s)  Provider:    You may see Redell Cave, MD or one of the following Advanced Practice Providers on your designated Care Team:   Lonni Meager, NP Lesley Maffucci, PA-C Bernardino Bring, PA-C Cadence Hanahan, PA-C Tylene Lunch, NP Barnie Hila, NP    We recommend signing up for the patient portal called MyChart.  Sign up information is provided on this After Visit Summary.  MyChart is used to connect with patients for Virtual Visits (Telemedicine).  Patients are able to view lab/test results, encounter notes, upcoming appointments, etc.  Non-urgent messages can be sent to your provider as well.   To learn more about what you can do with MyChart, go to [email or web address].

## 2023-11-09 NOTE — Progress Notes (Signed)
 Cardiology Office Note:    Date:  11/09/2023   ID:  Willie Griffin, DOB [birth year], MRN [id number]  PCP:  Jimmy Charlie FERNS, MD  [facility] Cardiologist:  Redell Cave, MD  [facility] Electrophysiologist:  None   Referring MD: Jimmy Charlie FERNS, MD   Chief Complaint  Patient presents with   Follow-up    Medication and bruising follow up pt has been doing well with no complaints of chest pain, chest pressure or SOB, medciation reviewed verbally with patient    History of Present Illness:    Willie Griffin is a 68 y.o. male with a hx of CAD (s/p PCI x 2 to LCx, 50% mid LAD in 7/24), hyperlipidemia, GERD, hypothyroidism who presents for follow-up.  Denies chest pain or shortness of breath, previously seen due to easy bruisability and daytime fatigue.  Followed up with sleep specialist, diagnosed with OSA.  Treatment plan being pursued as per pulmonary medicine.  Still bruises easily.  Takes aspirin , Plavix  as prescribed.  Endorses some muscle aches with taking Lipitor.  Prior notes Echo 08/2022 EF 60 to 65%    Past Medical History:  Diagnosis Date   Allergy    allergic rhinitis   GERD (gastroesophageal reflux disease)    Hemorrhoid    Hyperlipidemia    Hypothyroidism    Peyronie disease     Past Surgical History:  Procedure Laterality Date   BACK SURGERY     COLONOSCOPY     CORONARY STENT INTERVENTION N/A 08/21/2022   Procedure: CORONARY STENT INTERVENTION;  Surgeon: Darron Deatrice LABOR, MD;  Location: [facility] INVASIVE CV LAB;  Service: Cardiovascular;  Laterality: N/A;   HERNIA REPAIR     INGUINAL HERNIA REPAIR  09/2003   LEFT HEART CATH AND CORONARY ANGIOGRAPHY Left 08/21/2022   Procedure: LEFT HEART CATH AND CORONARY ANGIOGRAPHY;  Surgeon: Darron Deatrice LABOR, MD;  Location: [facility] INVASIVE CV LAB;  Service: Cardiovascular;  Laterality: Left;   MOHS SURGERY     nasal SCC----[facility]   SPINE SURGERY     TOENAIL EXCISION     partial   TONSILLECTOMY  1972   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     Allergies:   Bee venom and Ciprofloxacin   Social History   Socioeconomic History   Marital status: Divorced    Spouse name: Not on file   Number of children: 2   Years of education: Not on file   Highest education level: Not on file  Occupational History   Occupation: Merchandiser, retail at [employer]    Comment: for the [employer]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 No pets inside. Outdoor cats.      No living will   Not sure about health care POA--probably children   Would want resuscitation attempts   Would accept tube feeds--but not if cognitively unaware   Social Drivers of Corporate investment banker  Strain: Patient Declined (02/03/2023)   Overall Financial Resource Strain (CARDIA)    Difficulty of Paying Living Expenses: Patient declined  Food Insecurity: No Food Insecurity (08/18/2023)   Hunger Vital Sign    Worried About Running Out of Food in the Last Year: Never true    Ran Out of Food in the Last Year: Never true  Transportation Needs: No Transportation Needs (08/18/2023)   PRAPARE - Administrator, Civil Service (Medical): No    Lack of Transportation (Non-Medical): No  Physical Activity: Unknown (02/03/2023)   Exercise Vital Sign    Days of Exercise per Week: Patient declined    Minutes of Exercise per Session: Not on file  Stress: Patient Declined (02/03/2023)   [employer] of Occupational Health - Occupational Stress  Questionnaire    Feeling of Stress : Patient declined  Social Connections: Unknown (02/03/2023)   Social Connection and Isolation Panel    Frequency of Communication with Friends and Family: Patient declined    Frequency of Social Gatherings with Friends and Family: Patient declined    Attends Religious Services: Patient declined    Database administrator or Organizations: Patient declined    Attends Engineer, structural: Not on file    Marital Status: Patient declined     Family History: The patient's family history includes Arthritis in his mother; Breast cancer in his paternal grandmother and another family member; Cancer in his paternal aunt and sister; Celiac disease in his paternal uncle; Coronary artery disease in his maternal grandfather and paternal grandfather; Dementia in his father; Heart attack in his father and mother; Heart disease in his father; Hodgkin's lymphoma in his sister; Other in his paternal uncle and sister; Stroke in his maternal grandmother; Suicidality in his paternal grandmother. There is no history of Diabetes, Hypertension, Colon polyps, Colon cancer, Rectal cancer, Stomach cancer, or Esophageal cancer.  ROS:   Please see the history of present illness.     All other systems reviewed and are negative.  EKGs/Labs/Other Studies Reviewed:    The following studies were reviewed today:   EKG:  EKG not ordered today.   Recent Labs: 12/09/2022: ALT 17; BUN 18; Creatinine, Ser 1.11; Hemoglobin 14.5; Platelets 258.0; Potassium 4.4; Sodium 141; TSH 1.06  Recent Lipid Panel    Component Value Date/Time   CHOL 111 12/09/2022 0926   TRIG 62.0 12/09/2022 0926   HDL 39.90 12/09/2022 0926   CHOLHDL 3 12/09/2022 0926   VLDL 12.4 12/09/2022 0926   LDLCALC 58 12/09/2022 0926     Risk Assessment/Calculations:      Physical Exam:    VS:  BP (!) 100/50 (BP Location: Left Arm, Patient Position: Sitting)   Pulse (!) 59   Ht 5' 7 (1.702 m)   Wt 178 lb  12.8 oz (81.1 kg)   SpO2 97%   BMI 28.00 kg/m     Wt Readings from Last 3 Encounters:  11/09/23 178 lb (80.7 kg)  11/09/23 178 lb 12.8 oz (81.1 kg)  09/24/23 178 lb 6.4 oz (80.9 kg)     GEN:  Well nourished, well developed in no acute distress HEENT: Normal NECK: No JVD; No carotid bruits CARDIAC: RRR, no murmurs, rubs, gallops RESPIRATORY:  Clear to auscultation without rales, wheezing or rhonchi  ABDOMEN: Soft, non-tender, non-distended MUSCULOSKELETAL:  No edema; left lower extremity varicose veins noted SKIN: Warm and dry NEUROLOGIC:  Alert and oriented x 3 PSYCHIATRIC:  Normal affect   ASSESSMENT:  1. Coronary artery disease involving native coronary artery of native heart, unspecified whether angina present   2. Pure hypercholesterolemia    PLAN:    In order of problems listed above:  CAD, s/p PCI x 2 to Lcx, 50% mid LAD 7/24.  Denies chest pain continue aspirin , Lipitor 10 mg daily.  Can stop Plavix  due to easy bruisability.  EF 60 to 65% Hyperlipidemia, muscle aches, reduce Lipitor to 10 mg daily. 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  Last echo 8/24 EF normal, denies any symptoms of chest pain or shortness of breath.  Follow-up in 6 months.   Shared Decision Making/Informed Consent       Medication Adjustments/Labs and Tests Ordered: Current medicines are reviewed at length with the patient today.  Concerns regarding medicines are outlined above.  Orders Placed This Encounter  Procedures   EKG 12-Lead   Meds ordered this encounter  Medications   atorvastatin  (LIPITOR) 10 MG tablet    Sig: Take 1 tablet (10 mg total) by mouth daily.    Dispense:  90 tablet    Refill:  3    Patient Instructions  Medication Instructions:   Your physician recommends the following medication changes.  STOP TAKING: Plavix   DECREASE: Atorvastatin  (Lipitor) 10 mg once daily  *If you need a refill on your cardiac  medications before your next appointment, please call your pharmacy*  Lab Work:  None ordered at this time   If you have labs (blood work) drawn today and your tests are completely normal, you will receive your results only by:  MyChart Message (if you have MyChart) OR  A paper copy in the mail If you have any lab test that is abnormal or we need to change your treatment, we will call you to review the results.  Testing/Procedures:  None ordered at this time   Referrals:  None ordered at this time   Follow-Up:  At [facility], you and your health needs are our priority.  As part of our continuing mission to provide you with exceptional heart care, our providers are all part of one team.  This team includes your primary Cardiologist (physician) and Advanced Practice Providers or APPs (Physician Assistants and Nurse Practitioners) who all work together to provide you with the care you need, when you need it.  Your next appointment:   6 month(s)  Provider:    You may see Redell Cave, MD or one of the following Advanced Practice Providers on your designated Care Team:   Lonni Meager, NP Lesley Maffucci, PA-C Bernardino Bring, PA-C Cadence Rapelje, PA-C Tylene Lunch, NP Barnie Hila, NP    We recommend signing up for the patient portal called MyChart.  Sign up information is provided on this After Visit Summary.  MyChart is used to connect with patients for Virtual Visits (Telemedicine).  Patients are able to view lab/test results, encounter notes, upcoming appointments, etc.  Non-urgent messages can be sent to your provider as well.   To learn more about what you can do with MyChart, go to [email or web address].     Signed, Redell Cave, MD  11/09/2023 3:22 PM    [facility]

## 2023-11-09 NOTE — Progress Notes (Signed)
 Name:Willie Griffin MRN: [id number] DOB: 1955/08/29   CHIEF COMPLAINT:  CPAP F/U   HISTORY OF PRESENT ILLNESS: Willie Griffin is a 68 y.o. w/ a h/o OSA, CAD and hypothyroidism who presents for CPAP follow up visit. Reports using CPAP therapy every night, which is confirmed by compliance data. He is currently using the Airfit N30i nasal mask, which is comfortable. Reports feeling more tired upon awakening with CPAP therapy.     EPWORTH SLEEP SCORE    04/14/2023   10:14 AM  Results of the Epworth flowsheet  Sitting and reading 1  Watching TV 2  Sitting, inactive in a public place (e.g. a theatre or a meeting) 1  As a passenger in a car for an hour without a break 0  Lying down to rest in the afternoon when circumstances permit 2  Sitting and talking to someone 0  Sitting quietly after a lunch without alcohol 0  In a car, while stopped for a few minutes in traffic 0  Total score 6    PAST MEDICAL HISTORY :   has a past medical history of Allergy, GERD (gastroesophageal reflux disease), Hemorrhoid, Hyperlipidemia, Hypothyroidism, and Peyronie disease.  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 Prior to Admission medications   Medication Sig Start Date End Date Taking? Authorizing Provider  aspirin  EC 81 MG tablet Take 1 tablet (81 mg total) by mouth daily. Swallow whole. 02/28/20  Yes Agbor-Etang, Redell, MD  atorvastatin  (LIPITOR) 20 MG tablet TAKE 1 TABLET BY MOUTH EVERY DAY 08/13/23  Yes Agbor-Etang, Redell, MD  clopidogrel  (PLAVIX ) 75 MG tablet TAKE 1 TABLET BY MOUTH DAILY WITH BREAKFAST. 08/13/23  Yes Agbor-Etang, Redell, MD  Cyanocobalamin (VITAMIN B-12 PO) Take 2 Doses by mouth daily.   Yes  [provider]  EPIPEN  2-PAK 0.3 MG/0.3ML SOAJ injection INJECT 0.3 MG INTRAMUSCULARLY ONCE AS NEEDED FOR UP TO 1 DOSE 12/03/21  Yes Jimmy Charlie FERNS, MD  HYDROcodone -acetaminophen  (NORCO/VICODIN) 5-325 MG tablet Take 1 tablet by mouth every 6 (six) hours as needed. 07/25/23 07/24/24 Yes Saunders Shona CROME, PA-C  hydrocortisone  2.5 % cream Use rectally twice daily as neeed 12/09/22  Yes Letvak, Richard I, MD  ibuprofen  (ADVIL ) 600 MG tablet Take 1 tablet (600 mg total) by mouth 3 (three) times daily. With food 07/25/23  Yes Summers, Rhonda L, PA-C  levothyroxine  (SYNTHROID ) 25 MCG tablet TAKE 1 TABLET BY MOUTH EVERY DAY BEFORE BREAKFAST 09/18/22  Yes Jimmy Charlie FERNS, MD  methocarbamol  (ROBAXIN ) 500 MG tablet 1-2 tablets every 6 hours prn muscle spasms 07/25/23  Yes Saunders, Rhonda L, PA-C  OVER THE COUNTER MEDICATION Take 2 capsules by mouth daily. Synoviox includes MSM, turmeric, and hyaluronic acid   Yes [provider]  pantoprazole  (PROTONIX ) 40 MG tablet TAKE 1 TABLET BY MOUTH EVERY DAY 02/16/23  Yes Letvak, Richard I, MD  SOOLANTRA 1 % CREA Apply topically daily. to face 01/15/17  Yes [provider]  tadalafil  (CIALIS ) 20 MG tablet Take 0.5-1 tablets (10-20 mg total) by mouth every other day as needed for erectile dysfunction. 10/07/22  Yes Jimmy Charlie FERNS, MD   Allergies  Allergen Reactions   Bee Venom Other (See Comments)   Ciprofloxacin Other (See Comments)    Sensations like hot and cold  are markedly pronounced and dermatologist told him he may be allergic.    FAMILY HISTORY:  family history includes Arthritis in his mother; Breast cancer in his paternal grandmother and another family member; Cancer in his paternal aunt and sister; Celiac disease in his paternal uncle; Coronary artery disease in his maternal grandfather and paternal grandfather; Dementia in his father; Heart attack in his father and mother; Heart disease in his father; Hodgkin's lymphoma in his  sister; Other in his paternal uncle and sister; Stroke in his maternal grandmother; Suicidality in his paternal grandmother. SOCIAL HISTORY:  reports that he has never smoked. He has been exposed to tobacco smoke. He has never used smokeless tobacco. He reports that he does not drink alcohol and does not use drugs.   Review of Systems:  Gen:  Denies  fever, sweats, chills weight loss  HEENT: Denies blurred vision, double vision, ear pain, eye pain, hearing loss, nose bleeds, sore throat Cardiac:  No dizziness, chest pain or heaviness, chest tightness,edema, No JVD Resp:   No cough, -sputum production, -shortness of breath,-wheezing, -hemoptysis,  Gi: Denies swallowing difficulty, stomach pain, nausea or vomiting, diarrhea, constipation, bowel incontinence Gu:  Denies bladder incontinence, burning urine Ext:   Denies Joint pain, stiffness or swelling Skin: Denies  skin rash, easy bruising or bleeding or hives Endoc:  Denies polyuria, polydipsia , polyphagia or weight change Psych:   Denies depression, insomnia or hallucinations  Other:  All other systems negative  VITAL SIGNS: BP 110/60   Pulse 65   Temp 98.3 F (36.8 C)   Ht 5' 7 (1.702 m)   Wt 178 lb (80.7 kg)   SpO2 97%   BMI 27.88 kg/m    Physical Examination:   General Appearance: No distress  EYES PERRLA, EOM intact.   NECK Supple, No JVD Pulmonary: normal breath sounds, No wheezing.  CardiovascularNormal S1,S2.  No m/r/g.   Abdomen: Benign, Soft, non-tender. Skin:   warm, no rashes, no ecchymosis  Extremities: normal, no cyanosis, clubbing. Neuro:without focal findings,  speech normal  PSYCHIATRIC: Mood, affect within normal limits.   ASSESSMENT AND PLAN  OSA Patient is using and benefiting from CPAP therapy. Discussed the consequences of untreated sleep apnea. Advised not to drive drowsy for safety of patient and others. Will follow up in 3 months.     CAD Stable, on current management. Following with PCP.    Insufficient sleep time Counseled patient on increasing total sleep time to 7-8 hours nightly.    Patient satisfied with Plan of action and management. All questions answered  I spent a total of 27 minutes reviewing chart data, face-to-face evaluation with the patient, counseling and coordination of care as detailed above.    Maliah Pyles, M.D.  Sleep Medicine [facility]

## 2023-11-09 NOTE — Patient Instructions (Signed)

## 2023-11-11 ENCOUNTER — Telehealth: Payer: Self-pay | Admitting: Cardiology

## 2023-11-11 NOTE — Telephone Encounter (Signed)
 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 No update at this time.

## 2023-11-12 NOTE — Telephone Encounter (Signed)
 Willie Griffin came back into our office with DOT form. DOT paperwork and information needed sent over to [place] [facility] with a successful fax transmission received by receiving party.  Called patient to let him know it was faxed over, patient will follow up with [place] [facility] to see if they received appropriate paperwork.

## 2023-11-17 ENCOUNTER — Other Ambulatory Visit: Payer: Self-pay

## 2023-11-17 MED ORDER — LEVOTHYROXINE SODIUM 25 MCG PO TABS: ORAL_TABLET | ORAL | 0 refills | Status: DC

## 2023-12-10 ENCOUNTER — Ambulatory Visit

## 2023-12-10 VITALS — BP 110/64 | HR 66 | Temp 98.5°F | Ht 67.0 in | Wt 174.0 lb

## 2023-12-10 DIAGNOSIS — E538 Deficiency of other specified B group vitamins: Secondary | ICD-10-CM

## 2023-12-10 DIAGNOSIS — M5441 Lumbago with sciatica, right side: Secondary | ICD-10-CM | POA: Diagnosis not present

## 2023-12-10 DIAGNOSIS — N529 Male erectile dysfunction, unspecified: Secondary | ICD-10-CM

## 2023-12-10 DIAGNOSIS — Z131 Encounter for screening for diabetes mellitus: Secondary | ICD-10-CM | POA: Diagnosis not present

## 2023-12-10 DIAGNOSIS — Z125 Encounter for screening for malignant neoplasm of prostate: Secondary | ICD-10-CM | POA: Diagnosis not present

## 2023-12-10 DIAGNOSIS — E785 Hyperlipidemia, unspecified: Secondary | ICD-10-CM

## 2023-12-10 DIAGNOSIS — E039 Hypothyroidism, unspecified: Secondary | ICD-10-CM

## 2023-12-10 DIAGNOSIS — M5442 Lumbago with sciatica, left side: Secondary | ICD-10-CM

## 2023-12-10 DIAGNOSIS — G8929 Other chronic pain: Secondary | ICD-10-CM

## 2023-12-10 LAB — LIPID PANEL
Cholesterol: 126 mg/dL (ref 0–200)
HDL: 40 mg/dL (ref >39.00)
LDL Cholesterol: 66 mg/dL (ref 0–99)
NonHDL: 86.04
Total CHOL/HDL Ratio: 3
Triglycerides: 101 mg/dL (ref 0.0–149.0)
VLDL: 20.2 mg/dL (ref 0.0–40.0)

## 2023-12-10 LAB — PSA, MEDICARE: PSA: 2.54 ng/mL (ref 0.10–4.00)

## 2023-12-10 LAB — VITAMIN B12: Vitamin B-12: >1500 pg/mL — ABNORMAL HIGH (ref 211–911)

## 2023-12-10 LAB — HEMOGLOBIN A1C: Hgb A1c MFr Bld: 6 % (ref 4.6–6.5)

## 2023-12-10 MED ORDER — TADALAFIL 20 MG PO TABS
10.0000 mg | ORAL_TABLET | ORAL | 0 refills | Status: AC | PRN
Start: 2023-12-10 — End: ?

## 2023-12-10 NOTE — Progress Notes (Signed)
 Subjective:   This visit was conducted in person. The patient gave informed consent to the use of Abridge AI technology to record the contents of the encounter as documented below.   Patient ID: Willie Griffin, male    DOB: September 22, 1955, 68 y.o.   MRN: [id number]   Discussed the use of AI scribe software for clinical note transcription with the patient, who gave verbal consent to proceed.  History of Present Illness Willie Griffin is a 68 year old male who presents for medication management and follow-up.  He has a history of hypothyroidism and takes levothyroxine . He has not been taking it separately from other medications, including aspirin .  He has coronary artery disease and had a coronary stent placed in July 2024. He is currently on Lipitor 10 mg daily, which was reduced due to muscle aches. He is concerned about taking medications and prefers to monitor his current regimen before making changes.  He experiences acid reflux and has been on Protonix  since July 2024 after his stent placement.  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 He had back surgery over ten years ago for a spinal tumor. He experiences occasional flare-ups of pain, which he manages with Tylenol , as he cannot take NSAIDs due to his heart condition.  He reports plantar fasciitis and has recently purchased new insoles. He has not yet started any exercises for this condition.  He takes a B12 supplement, two gummies daily totaling 3000 mcg, but plans to reduce this to one gummy daily. He also takes a daily aspirin  and a probiotic.  He uses a CPAP machine for sleep apnea and has a history of high cholesterol and rosacea. He has a bee allergy and keeps an EpiPen  due to his hobby of keeping honey bees.  He reports swelling in legs but does not elevate them regularly.    Review of Systems  All other systems reviewed and are negative.        Allergies  Allergen Reactions   Bee Venom Other (See Comments)   Ciprofloxacin Other (See Comments)    Sensations like hot and cold are markedly pronounced and dermatologist told him he may be allergic.    Current Outpatient Medications on File Prior to Visit  Medication Sig Dispense Refill   aspirin  EC 81 MG tablet Take 1 tablet (81 mg total) by mouth daily. Swallow whole. 90 tablet 3   atorvastatin  (LIPITOR) 10 MG tablet Take 1 tablet (10 mg total) by mouth daily. 90 tablet 3   Cyanocobalamin (VITAMIN B-12 PO) Take 2 Doses by mouth daily.     EPIPEN  2-PAK 0.3 MG/0.3ML SOAJ injection INJECT 0.3 MG INTRAMUSCULARLY ONCE AS NEEDED FOR UP TO 1 DOSE 2 each 1   hydrocortisone  2.5 % cream Use rectally twice daily as neeed 30 g 5   levothyroxine  (SYNTHROID ) 25 MCG tablet TAKE 1 TABLET BY MOUTH EVERY DAY BEFORE BREAKFAST 90 tablet 0   OVER THE COUNTER MEDICATION Take 2 capsules by mouth daily. Synoviox includes MSM, turmeric, and hyaluronic acid     pantoprazole  (PROTONIX ) 40 MG tablet TAKE 1 TABLET BY MOUTH EVERY DAY 90 tablet 3   SOOLANTRA 1 % CREA Apply topically daily. to face  5   No current facility-administered medications on file prior to visit.    BP 110/64 (BP Location: Right Arm, Patient Position: Sitting, Cuff Size: Large)   Pulse 66   Temp 98.5 F (  36.9 C) (Oral)   Ht 5' 7 (1.702 m)   Wt 174 lb (78.9 kg)   SpO2 96%   BMI 27.25 kg/m   Objective:      Physical Exam GENERAL: Alert, cooperative, well developed, no acute distress. HEAD: Normocephalic atraumatic. EYES: Extraocular movements intact bilaterally, pupils round, equal and reactive to light bilaterally, conjunctivae normal bilaterally. EXTREMITIES: No cyanosis or edema. NEUROLOGICAL: Oriented to person, place and time, no gait abnormalities, moves all extremities without gross motor or sensory deficit. MUSCULOSKELETAL: Non-tender to palpation over lumbar para-spinal muscles bilaterally. Positive straight leg  raise bilaterally.        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 Pain is intermittent and not worsening. He prefers conservative management. Lumbar spine x-ray showed disc space narrowing at L3-L5, thoracic spine x-ray showed mild curvature.  Will treat conservatively as below.  - Referred to physical therapy at [facility] for 8-12 weeks. - Use Tylenol  for pain management, avoid NSAIDs due to kidney function concerns.  Hypothyroidism Managed with levothyroxine . Current regimen may not be optimal due to concurrent medication intake. - Instructed to take levothyroxine  first thing in the morning, separate from other medications and food. - Scheduled thyroid  function test in 8 weeks.  Hyperlipidemia with history of coronary stent Hyperlipidemia with coronary stent placement in July 2024. Current atorvastatin  dose may be insufficient for cardiovascular protection reportedly had muscle aches on higher dose. Extensively discussed about fluvastatin which might be better tolerated, he declines at this time. He prefers to continue current regimen.  - Continue Lipitor 10 mg daily - Ordered cholesterol level.  Gastroesophageal reflux disease GERD managed with Protonix . Long-term use may have side effects, risks discussed. He prefers to reduce dose rather than discontinue.  - Take half a tablet of Protonix  daily for 2 weeks, then use as needed. - Use Tums for flare-ups before Protonix . - Consider treating GERD with Pepcid instead if flareups continue once completely tapered   Plantar fasciitis Likely exacerbated by recent change in footwear. New insoles ordered but not yet started using.  - Provided exercises for plantar fasciitis. - Use new insoles once received.  Male erectile dysfunction Erectile dysfunction managed with Cialis . He uses medication as needed. -  Prescribed Cialis , 10 tablets, to be taken as needed.  Deficiency of B group vitamins (on B12 supplementation) B12 supplementation with high dose gummies. Will order levels to monitor  - Ordered B12 level. - Reduce B12 supplementation to one gummy daily.  General Health maintenance See age-appropriate screening labs ordered below in preparation for upcoming physical  - Ordered A1c to screen for diabetes. - Ordered prostate-specific antigen (PSA) test. - Ordered cholesterol level.     Return in about 3 weeks (around 12/31/2023) for CPE with me and medicare wellness with Erminio 1 wk later .   Willie Griffin Willie Iyesha Such, MD  12/10/23     Contains text generated by Abridge.

## 2023-12-10 NOTE — Patient Instructions (Addendum)
 Thank you for visiting [facility] today! Here's what we talked about: - START taking half a tablet of Protonix  for 2 weeks then after only as needed. If flare up try tums -Physical therapy will call you

## 2023-12-11 ENCOUNTER — Ambulatory Visit: Payer: Self-pay

## 2023-12-11 DIAGNOSIS — R7303 Prediabetes: Secondary | ICD-10-CM | POA: Insufficient documentation

## 2023-12-14 NOTE — Therapy (Unsigned)
 OUTPATIENT PHYSICAL THERAPY EVALUATION   Patient Name: Willie Griffin MRN: [id number] DOB:Sep 06, 1955, 68 y.o., male Today's Date: 12/15/2023  END OF SESSION:  PT End of Session - 12/15/23 1115     Visit Number 1    Number of Visits 25    Date for Recertification  03/08/24    Authorization Type MEDICARE PART B reporting period from 12/15/2023    Progress Note Due on Visit 10    PT Start Time 1035    PT Stop Time 1118    PT Time Calculation (min) 43 min    Activity Tolerance Patient tolerated treatment well    Behavior During Therapy --   flat affect        Past Medical History:  Diagnosis Date   Allergy    allergic rhinitis   GERD (gastroesophageal reflux disease)    Hemorrhoid    Hyperlipidemia    Hypothyroidism    Peyronie disease    Past Surgical History:  Procedure Laterality Date   BACK SURGERY     COLONOSCOPY     CORONARY STENT INTERVENTION N/A 08/21/2022   Procedure: CORONARY STENT INTERVENTION;  Surgeon: Darron Deatrice LABOR, MD;  Location: [facility];  Service: Cardiovascular;  Laterality: N/A;   HERNIA REPAIR     INGUINAL HERNIA REPAIR  09/2003   LEFT HEART CATH AND CORONARY ANGIOGRAPHY Left 08/21/2022   Procedure: LEFT HEART CATH AND CORONARY ANGIOGRAPHY;  Surgeon: Darron Deatrice LABOR, MD;  Location: [facility];  Service: Cardiovascular;  Laterality: Left;   MOHS SURGERY     nasal SCC----[facility]   SPINE SURGERY     TOENAIL EXCISION     partial   TONSILLECTOMY  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     Patient Active Problem List   Diagnosis Date Noted   Prediabetes 12/11/2023   Atypical pneumonia 02/09/2023   Hyperlipidemia LDL goal <70 08/22/2022   Stable angina 08/21/2022   Abnormal cardiac CT angiography 08/21/2022   Coronary artery disease of native artery of native heart with stable angina pectoris 08/21/2022   Coronary artery calcification 11/23/2020   Spondylosis of lumbosacral region without myelopathy or radiculopathy  01/14/2018   GERD (gastroesophageal reflux disease) 10/20/2017   Chronic back pain 03/17/2017   Hypothyroidism    Lumbar radiculopathy 04/24/2015   Rosacea 10/21/2013   Allergic rhinitis 03/17/2007   Peyronie's disease 02/19/2007   PCP: Bennett Reuben MARLA, MD  REFERRING PROVIDER: Bennett Reuben MARLA, MD  REFERRING DIAG: Chronic midline low back pain with bilateral sciatica  Rationale for Evaluation and Treatment: Rehabilitation  THERAPY DIAG:  Other low back pain  Sciatica, right side  Radiculopathy, thoracolumbar region  Sciatica, left side  ONSET DATE: over 10 years ago, insidious onset with worsening symptoms  SUBJECTIVE:  SUBJECTIVE STATEMENT: Patient stated today is a relatively good day for his back. Normally his L side is worst than the R in terms of numbness and tingling, pain in the low back is present across flank of low back. Patient also reports his knees has occasionally giving out on him but he believes it is not related.  PERTINENT HISTORY:  Patient is a 68 y.o. male who presents to outpatient physical therapy with a referral for medical diagnosis Chronic midline low back pain with bilateral sciatica. This patient's chief complaints consist of chronic low back with occasional numbness and tingling down B LE into the feet, leading to the following functional deficits: bending, sitting, standing and laying. Relevant past medical history and comorbidities include the following: he has Allergic rhinitis; Peyronie's disease; Rosacea; Lumbar radiculopathy; Hypothyroidism; Chronic back pain; GERD (gastroesophageal reflux disease); Spondylosis of lumbosacral region without myelopathy or radiculopathy; Coronary artery calcification; Stable angina; Abnormal cardiac CT angiography; Coronary artery disease  of native artery of native heart with stable angina pectoris; Hyperlipidemia LDL goal <70; Atypical pneumonia; and Prediabetes on their problem list. he  has a past medical history of Allergy, GERD (gastroesophageal reflux disease), Hemorrhoid, Hyperlipidemia, Hypothyroidism, and Peyronie disease. 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 Patient denies hx of stroke, seizures, lung problems, diabetes, unexplained weight loss, unexplained changes in bowel or bladder problems, and osteoporosis  PAIN:  Are you having pain? Yes Pain location: Normally his L side is worst than the R in terms of numbness and tingling, pain in the low back is present across flank of low back.  Patient also reports his knees has occasionally giving out on him but he believes it is not related. Has intermittent numbness/tingling down back of both legs to bottoms of feet (but not present currently) Pain description: numbness and tingling in feet NPRS: Current: 0/10 but noticed it when he sat down 1/10  Worst: 10/10   Aggravating: sitting, standing, laying  Best: 0/10   Relieving: can be sitting standing and or laying  PRECAUTIONS: None  RED FLAGS: No red flags   WEIGHT BEARING RESTRICTIONS: No  FALLS:  Has patient fallen in last 6 months? Yes. 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  DIAGNOSTIC FINDINGS:  07/25/2023 XRAY THORACIC SPINE 2 VIEWS   COMPARISON:  Chest radiograph 01/17/2023   FINDINGS: Mild curvature in the midthoracic spine. Vertebral body heights are maintained. No evidence for a fracture. Mild disc space narrowing and endplate changes in the lower cervical spine. Normal alignment at the cervicothoracic junction.   IMPRESSION: 1. No acute bone abnormality in the thoracic spine. 2. Mild curvature in the midthoracic spine. 3. Mild degenerative changes in the lower cervical spine.   07/25/2023 XRAY LUMBAR SPINE - 2-3 VIEW   COMPARISON:  Thoracic spine 07/17/2023 and MRI  lumbar spine 12/18/2017   FINDINGS: Mild disc space narrowing at L3-L4 and L4-L5. Vertebral body heights are maintained. Aortic atherosclerotic calcifications. Mild degenerative endplate changes. Lumbar spine alignment is within normal limits. Normal alignment at the thoracolumbar junction. Symmetric appearance of the SI joints.   IMPRESSION: 1. Disc space narrowing at L3-L4 and L4-L5. 2. No acute bone abnormality in lumbar spine.   PATIENT SURVEYS:  MODIFIED OSWESTRY DISABILITY SCALE  Date: 12/14/23 Score  Pain intensity 1 = The pain is bad, but I can manage without having to take pain medication  2. Personal care (washing, dressing, etc.) 0 =  I can take care of myself normally without causing increased pain.  3. Lifting 1 = I can lift heavy weights, but it causes increased pain.  4. Walking 0 = Pain does not prevent me from walking any distance  5. Sitting 0 =  I can sit in any chair as long as I like.  6. Standing 0 =  I can stand as long as I want without increased pain.  7. Sleeping 1 = I can sleep well only by using pain medication.  8. Social Life 1 =  My social life is normal, but it increases my level of pain.  9. Traveling 0 =  I can  travel anywhere without increased pain.  10. Employment/ Homemaking 1 = My normal homemaking/job activities increase my pain, but I can still perform all that is required of me  Total 5/50 (10%)   Interpretation of scores: Score Category Description  0-20% Minimal Disability The patient can cope with most living activities. Usually no treatment is indicated apart from advice on lifting, sitting and exercise  21-40% Moderate Disability The patient experiences more pain and difficulty with sitting, lifting and standing. Travel and social life are more difficult and they may be disabled from work. Personal care, sexual activity and sleeping are not grossly affected, and the patient can usually be managed by conservative means  41-60% Severe Disability Pain remains the main problem in this group, but activities of daily living are affected. These patients require a detailed investigation  61-80% Crippled Back pain impinges on all aspects of the patient's life. Positive intervention is required  81-100% Bed-bound These patients are either bed-bound or exaggerating their symptoms  Bluford FORBES Zoe DELENA Karon DELENA, et al. Surgery versus conservative management of stable thoracolumbar fracture: the PRESTO feasibility RCT. [place] ([place]): [employer]; 2021 Nov. [facility] Technology Assessment, No. 25.62.) Appendix 3, Oswestry Disability Index category descriptors. Available from: Findjewelers.cz  Minimally Clinically Important Difference (MCID) = 12.8%   LUMBAR ROM:   AROM Eval (12/14/23)  Flexion 75%  Extension 25%*  Right lateral flexion 25% (muscle tightness on L side)  Left lateral flexion 25%*  Right rotation 50%  Left rotation 50%      * = concordant pain across low back  FUNCTIONAL/BALANCE TESTS  6-Minute Walk Test ( ): Purpose: to assess function with community mobility Results: 1,816 feet independently  NEURAL TENSION TESTING: Slump Test: R   = + (pt reported tingling upon completion of test down the leg) L  = + (pt reported pain with DF and tingling down the leg)  Straight Leg Raise:  From supine R  = + (pt reported tightness behind knee and pain in low back) L  = + (pt reported tightness and pain in low back) With pre-flexed hip/knee R = - (pt reported tightness behind knee) L = - (pt reported hamstring tightness)  LUMBAR  JOINT MOBILITY CPA'S L1: Grade 1- hypomobile (pt reported pain that radiated laterally with first trial and decreased after 2nd) L2: Grade 1- hypomobile(pt reported pain that radiated laterally with first trial and decreased after 2nd)  L3: Grade 1- hypomobile  L4: Grade 1- hypomobile  L5: Grade 1- hypomobile  TREATMENT:                                                                                                                               Therapeutic exercise: therapeutic exercises that incorporate ONE parameter at one or more areas of the body to centralize symptoms, develop strength and endurance, range of motion, and flexibility required for successful completion of functional activities.    Hooklying PPT    1 x 10   Cued patient to complete in a pain free range   Added to HEP  PATIENT EDUCATION:  Education details: Exercise purpose/form. Self management techniques. Education on diagnosis, prognosis, POC, anatomy and physiology of current condition. Education on HEP including handout. Person educated: Patient Education method: Explanation, Demonstration, Tactile cues, Verbal cues, and Handouts Education comprehension: verbalized understanding, returned demonstration, and verbal cues required  HOME EXERCISE PROGRAM: Access Code: [email or web address] URL: [email or web address] Date: 12/15/2023 Prepared by: Vernell Rise  Exercises - Supine Posterior Pelvic Tilt  - 2 x daily - 3 sets - 10 reps  ASSESSMENT:  CLINICAL IMPRESSION: Patient is a 68 y.o. male who was seen today for  physical therapy evaluation and treatment for chronic low back pain with B radiculopathy with the L side being worse than the R. Patient experienced decreased low back pain with repeated PPT in hook lying, suggesting he may prefer flexion based motions. Patient demonstrates a difficulty with articulating what specific motions aggravate his symptoms, suggesting a lack of awareness and knowledge of his condition and how to manage it effectively. This will need to be addressed to help with symptom management and his ability to improve functional activity with less limitations.Patient has grade 1 (considerable) hypermobility throughout his lumbar spine which is contributing to the lack of AROM in extension, side bending and rotation. Patient also had positive slump and SLR tests, indicating bilateral nerve irritation. Patient completed to assess symptom aggravation but patient demonstrated normal gait and no specific irritation of symptoms with that test with normal distance. He did have an increase in low back pain up to 1/10 by end of evaluation and experienced tingling in the R foot after slump testing. Patient's condition lacks stability in symptoms and although he reports today is a good day, he has days where he can barely get out of bed due to the pain. Patient will benefit from skilled physical therapy intervention to address current body structure impairments and activity limitations to improve function and work towards goals set in current POC in order to return to prior level of function or maximal functional improvement.   OBJECTIVE IMPAIRMENTS: decreased ROM, hypomobility, impaired flexibility, pain, and motor control,  knowledge of condition, neural tension.   ACTIVITY LIMITATIONS: bending, sitting, standing, and sleeping  PARTICIPATION LIMITATIONS: patient states there a day where he can do anything without pain and others when he can't do anything.  PERSONAL FACTORS: Past/current experiences,  Time since onset of injury/illness/exacerbation, 3+ comorbidities: coronary artery calcification, stable angina, GERD and hx of spinal tumor, and level of engagement in PT process are also affecting patient's functional outcome.   REHAB POTENTIAL: Good  CLINICAL DECISION MAKING: Stable/uncomplicated  EVALUATION COMPLEXITY: Low  GOALS: Goals reviewed with patient? No  SHORT TERM GOALS: Target date: 12/29/2023  Patient will be independent with initial home exercise program for self-management of symptoms. Baseline: Initial HEP provided at IE (12/15/23); Goal status: INITIAL  LONG TERM GOALS: Target date: 03/08/2024  Patient will be independent with a long-term home exercise program for self-management of symptoms.  Baseline: Initial HEP provided at IE (12/15/23); Goal status: INITIAL  2.  Patient will improve active lumbar ext and bilateral side bending to >75% to improve tension and movement of the spine to reduce aggravating his symptoms. Baseline: Ext/R Side bending/L Side bending: 25% (12/15/23); Goal status: INITIAL  3.  Patient will articulate what makes his pain better or worse to demonstrate improve awareness of his condition behavior to improve ability to participate in functional activities with less irritation. Baseline: unable to articulate clearly at this time (12/15/23); Goal status: INITIAL  4.  Patient will demonstrate improvement in Patient Specific Functional Scale (PSFS) of equal or greater than 8/10 points to reflect clinically significant improvement in patient's most valued functional activities. Baseline: to be measured at visit 2 as appropriate (12/15/23); Goal status: INITIAL  5.  Patient will report NPRS equal or less than 3/10 during functional activities during the last 2 weeks to improve their abilitly to complete community, work and/or recreational activities with less limitation. Baseline: 10/10 (12/15/23); Goal status: INITIAL  PLAN:  PT FREQUENCY:  2x/week  PT DURATION: 12 weeks  PLANNED INTERVENTIONS: 97164- PT Re-evaluation, 97750- Physical Performance Testing, 97110-Therapeutic exercises, 97530- Therapeutic activity, [id number]- Neuromuscular re-education, 97535- Self Care, 02859- Manual therapy, [id number]- Electrical stimulation (unattended), 20560 (1-2 muscles), 20561 (3+ muscles)- Dry Needling, Patient/Family education, Balance training, Spinal mobilization, Cryotherapy, and Moist heat.  PLAN FOR NEXT SESSION: Complete PSFS, take BP. Consider working on anti-extension exercises such as PPT bridging and lumbar flexion roll outs. Update HEP. Manual therapy as needed. Consider repeated motions testing.    Vernell Rise, SPT 12/15/2023, 3:26 PM  Camie SAUNDERS. Juli, PT, DPT, Cert. MDT 12/15/23, 3:41 PM  [facility] [address] [place], [place] [address] P: [phone] I F: [phone]

## 2023-12-15 ENCOUNTER — Ambulatory Visit: Admitting: Physical Therapy

## 2023-12-15 ENCOUNTER — Encounter: Payer: Self-pay | Admitting: Physical Therapy

## 2023-12-15 DIAGNOSIS — M5441 Lumbago with sciatica, right side: Secondary | ICD-10-CM | POA: Diagnosis not present

## 2023-12-15 DIAGNOSIS — M5432 Sciatica, left side: Secondary | ICD-10-CM | POA: Insufficient documentation

## 2023-12-15 DIAGNOSIS — M5431 Sciatica, right side: Secondary | ICD-10-CM | POA: Insufficient documentation

## 2023-12-15 DIAGNOSIS — G8929 Other chronic pain: Secondary | ICD-10-CM | POA: Diagnosis not present

## 2023-12-15 DIAGNOSIS — M5442 Lumbago with sciatica, left side: Secondary | ICD-10-CM | POA: Insufficient documentation

## 2023-12-15 DIAGNOSIS — M5459 Other low back pain: Secondary | ICD-10-CM | POA: Insufficient documentation

## 2023-12-15 DIAGNOSIS — M5415 Radiculopathy, thoracolumbar region: Secondary | ICD-10-CM | POA: Insufficient documentation

## 2023-12-16 NOTE — Therapy (Unsigned)
 OUTPATIENT PHYSICAL THERAPY TREATMENT   Patient Name: Willie Griffin MRN: [id number] DOB:12-Jan-1956, 68 y.o., male Today's Date: 12/17/2023  END OF SESSION:  PT End of Session - 12/17/23 1029     Visit Number 2    Number of Visits 25    Date for Recertification  03/08/24    Authorization Type MEDICARE PART B reporting period from 12/15/2023    Progress Note Due on Visit 10    PT Start Time 1030    PT Stop Time 1117    PT Time Calculation (min) 47 min    Activity Tolerance Patient tolerated treatment well    Behavior During Therapy --          Past Medical History:  Diagnosis Date   Allergy    allergic rhinitis   GERD (gastroesophageal reflux disease)    Hemorrhoid    Hyperlipidemia    Hypothyroidism    Peyronie disease    Past Surgical History:  Procedure Laterality Date   BACK SURGERY     COLONOSCOPY     CORONARY STENT INTERVENTION N/A 08/21/2022   Procedure: CORONARY STENT INTERVENTION;  Surgeon: Darron Deatrice LABOR, MD;  Location: [facility];  Service: Cardiovascular;  Laterality: N/A;   HERNIA REPAIR     INGUINAL HERNIA REPAIR  09/2003   LEFT HEART CATH AND CORONARY ANGIOGRAPHY Left 08/21/2022   Procedure: LEFT HEART CATH AND CORONARY ANGIOGRAPHY;  Surgeon: Darron Deatrice LABOR, MD;  Location: [facility];  Service: Cardiovascular;  Laterality: Left;   MOHS SURGERY     nasal SCC----[facility]   SPINE SURGERY     TOENAIL EXCISION     partial   TONSILLECTOMY  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     Patient Active Problem List   Diagnosis Date Noted   Prediabetes 12/11/2023   Atypical pneumonia 02/09/2023   Hyperlipidemia LDL goal <70 08/22/2022   Stable angina 08/21/2022   Abnormal cardiac CT angiography 08/21/2022   Coronary artery disease of native artery of native heart with stable angina pectoris 08/21/2022   Coronary artery calcification 11/23/2020   Spondylosis of lumbosacral region without myelopathy or radiculopathy 01/14/2018   GERD  (gastroesophageal reflux disease) 10/20/2017   Chronic back pain 03/17/2017   Hypothyroidism    Lumbar radiculopathy 04/24/2015   Rosacea 10/21/2013   Allergic rhinitis 03/17/2007   Peyronie's disease 02/19/2007   PCP: Bennett Griffin MARLA, MD  REFERRING PROVIDER: Bennett Griffin MARLA, MD  REFERRING DIAG: Chronic midline low back pain with bilateral sciatica  Rationale for Evaluation and Treatment: Rehabilitation  THERAPY DIAG:  Sciatica, right side  Other low back pain  Radiculopathy, thoracolumbar region  Sciatica, left side  ONSET DATE: over 10 years ago, insidious onset with worsening symptoms  SUBJECTIVE:  SUBJECTIVE STATEMENT: Patient stated he has been having a good day again today and stated he did not have any additional pain symptoms after his first visit. Patient stated he doesn't really know how PT will help him and asked questions regarding what was causing his sciatica symptoms. Stated he is doing PT because his doctor told him to.  PERTINENT HISTORY:  Patient is a 68 y.o. male who presents to outpatient physical therapy with a referral for medical diagnosis Chronic midline low back pain with bilateral sciatica. This patient's chief complaints consist of chronic low back with occasional numbness and tingling down B LE into the feet, leading to the following functional deficits: bending, sitting, standing and laying. Relevant past medical history and comorbidities include the following: he has Allergic rhinitis; Peyronie's disease; Rosacea; Lumbar radiculopathy; Hypothyroidism; Chronic back pain; GERD (gastroesophageal reflux disease); Spondylosis of lumbosacral region without myelopathy or radiculopathy; Coronary artery calcification; Stable angina; Abnormal cardiac CT angiography; Coronary artery  disease of native artery of native heart with stable angina pectoris; Hyperlipidemia LDL goal <70; Atypical pneumonia; and Prediabetes on their problem list. he  has a past medical history of Allergy, GERD (gastroesophageal reflux disease), Hemorrhoid, Hyperlipidemia, Hypothyroidism, and Peyronie disease. 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 Patient denies hx of stroke, seizures, lung problems, diabetes, unexplained weight loss, unexplained changes in bowel or bladder problems, and osteoporosis  PAIN:   NPRS: patient denied pain upon arrival  From initial visit: Are you having pain? Yes Pain location: Normally his L side is worst than the R in terms of numbness and tingling, pain in the low back is present across flank of low back.  Patient also reports his knees has occasionally giving out on him but he believes it is not related. Has intermittent numbness/tingling down back of both legs to bottoms of feet (but not present currently) Pain description: numbness and tingling in feet NPRS: Current: 0/10 but noticed it when he sat down 1/10  Worst: 10/10   Aggravating: sitting, standing, laying  Best: 0/10   Relieving: can be sitting standing and or laying  PRECAUTIONS: None  RED FLAGS: No red flags   WEIGHT BEARING RESTRICTIONS: No  FALLS:  Has patient fallen in last 6 months? Yes. 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   STM to L lower paraspinals 5 min  Noted trigger point to the L of L3 that loosened upon completion of STM  Therapeutic exercise: therapeutic exercises that incorporate ONE parameter at one or more areas of the body to  centralize symptoms, develop strength and endurance, range of motion, and flexibility required for successful completion of functional activities.   Vitals assessed (see above)  Hooklying Lower trunk rotations to reduce pain in lumbar spine musculature  2 x 10   Standing hip abductions with UE support on TM Improve LE strength  1 x 10 each side no resistance  2 x 10 each side with 2# ankle weights  Standing hip flexor stretch to reduce tension  2 x 15 seconds each side    Seated piriformis stretch to reduce tension   2 x 30 seconds each side   Added to HEP  Prone press ups to assess symptoms after repeated motions   1 x 10   Pt reported no change in symptoms  Education on prognosis, diagnosis and benefits of therapy  Neuromuscular Re-education: a technique or exercise performed with the goal of improving the level of communication between the body and the brain, such as for balance, motor control, muscle activation patterns, coordination, desensitization, quality of muscle contraction, proprioception, and/or kinesthetic sense needed for successful and safe completion of functional activities.    Quadruped bird dogs to improve core strength and balance   2 x 10   Pt noted increase difficulty with lifting L arm and R  leg  Hooklying PPT with bridging to improve lumbopelvic control with movment   2 x 10   Cued patient to complete in a pain free range   Added to HEP   Hooklying PPT to impove lumbo pelvic control   2 x 10   Cued patient to complete in a pain free range  Pt required multimodal cuing for proper technique and to facilitate improved neuromuscular control, strength, range of motion, and functional ability resulting in improved performance and form.  PATIENT EDUCATION:  Education details: Exercise purpose/form. Self management techniques. Education on diagnosis, prognosis, POC, anatomy and physiology of current condition. Education on HEP including handout. Person educated: Patient Education method: Explanation, Demonstration, Tactile cues, Verbal cues, and Handouts Education comprehension: verbalized understanding, returned demonstration, and verbal cues required  HOME EXERCISE PROGRAM: Access Code: [email or web address] URL: [email or web address] Date: 12/17/2023 Prepared by: Willie Griffin  Exercises - Supine Posterior Pelvic Tilt  - 2 x daily - 3 sets - 10 reps - Supine Lower Trunk Rotation  - 2 x daily - 3 sets - 10 reps - Supine Bridge  - 2 x daily - 3 sets - 10 reps - Seated Piriformis Stretch  - 2 x daily - 2 sets - 30 hold - Hip Flexor Stretch with Chair  - 2 x daily - 2 sets - 15 hold  ASSESSMENT:  CLINICAL IMPRESSION: Prior to session patient was educated on prognosis, diagnosis and benefit of PT to help improve patient buy in. Patient reported he has some low back pain mainly in his L side with positional changes that decreases in under 1 minute. Completed piriformis stretching and lower trunk rotations in attempt to decrease some tension in lower back. Completed STM to L lower paraspinals and found a small trigger point to the L of L3 that was loosened by completion on STM. Patient noted no increase pain with palpation of lower back but stated it felt good. Quadruped bird  dogs were completed to work on core strength with movement of UE and LE extremities. Upon completion of session pain noted a aching pain across his low back at a 1/10. Patient was only able to contribute and rate one activity on the PSFS after  significant encouragement. Patient will benefit from skilled physical therapy intervention to address current body structure impairments and activity limitations to improve function and work towards goals set in current POC in order to return to prior level of function or maximal functional improvement.   OBJECTIVE IMPAIRMENTS: decreased ROM, hypomobility, impaired flexibility, pain, and motor control, knowledge of condition, neural tension.   ACTIVITY LIMITATIONS: bending, sitting, standing, and sleeping  PARTICIPATION LIMITATIONS: patient states there a day where he can do anything without pain and others when he can't do anything.  PERSONAL FACTORS: Past/current experiences, Time since onset of injury/illness/exacerbation, 3+ comorbidities: coronary artery calcification, stable angina, GERD and hx of spinal tumor, and level of engagement in PT process are also affecting patient's functional outcome.   REHAB POTENTIAL: Good  CLINICAL DECISION MAKING: Stable/uncomplicated  EVALUATION COMPLEXITY: Low  GOALS: Goals reviewed with patient? No  SHORT TERM GOALS: Target date: 12/29/2023  Patient will be independent with initial home exercise program for self-management of symptoms. Baseline: Initial HEP provided at IE (12/15/23); Goal status:  in progress  LONG TERM GOALS: Target date: 03/08/2024  Patient will be independent with a long-term home exercise program for self-management of symptoms.  Baseline: Initial HEP provided at IE (12/15/23); Goal status:  in progress  2.  Patient will improve active lumbar ext and bilateral side bending to >75% to improve tension and movement of the spine to reduce aggravating his symptoms. Baseline: Ext/R Side  bending/L Side bending: 25% (12/15/23); Goal status:  in progress  3.  Patient will articulate what makes his pain better or worse to demonstrate improve awareness of his condition behavior to improve ability to participate in functional activities with less irritation. Baseline: unable to articulate clearly at this time (12/15/23); Goal status:  in progress  4.  Patient will demonstrate improvement in Patient Specific Functional Scale (PSFS) of equal or greater than 8/10 points to reflect clinically significant improvement in patient's most valued functional activities. Baseline: to be measured at visit 2 as appropriate (12/15/23); 5/10 (12/17/23) Goal status:  in progress  5.  Patient will report NPRS equal or less than 3/10 during functional activities during the last 2 weeks to improve their abilitly to complete community, work and/or recreational activities with less limitation. Baseline: 10/10 (12/15/23); Goal status: in progress  PLAN:  PT FREQUENCY: 2x/week  PT DURATION: 12 weeks  PLANNED INTERVENTIONS: 97164- PT Re-evaluation, 97750- Physical Performance Testing, 97110-Therapeutic exercises, 97530- Therapeutic activity, [id number]- Neuromuscular re-education, 97535- Self Care, 02859- Manual therapy, [id number]- Electrical stimulation (unattended), 20560 (1-2 muscles), 20561 (3+ muscles)- Dry Needling, Patient/Family education, Balance training, Spinal mobilization, Cryotherapy, and Moist heat.  PLAN FOR NEXT SESSION: Consider working on rotational activities with core activation such as reverse wood chops from tall kneeling. Update HEP. Manual therapy as needed.  Willie Griffin, SPT 12/17/2023, 2:49 PM  Willie Griffin. Willie Griffin, PT, DPT, Cert. MDT 12/17/23, 2:54 PM  Willie Griffin [facility] [address] [place], [place] [address] P: [phone] I F: [phone]

## 2023-12-17 ENCOUNTER — Ambulatory Visit: Admitting: Physical Therapy

## 2023-12-17 ENCOUNTER — Encounter: Payer: Self-pay | Admitting: Physical Therapy

## 2023-12-17 VITALS — BP 134/69

## 2023-12-17 DIAGNOSIS — M5415 Radiculopathy, thoracolumbar region: Secondary | ICD-10-CM

## 2023-12-17 DIAGNOSIS — M5459 Other low back pain: Secondary | ICD-10-CM | POA: Diagnosis not present

## 2023-12-17 DIAGNOSIS — M5432 Sciatica, left side: Secondary | ICD-10-CM

## 2023-12-17 DIAGNOSIS — M5431 Sciatica, right side: Secondary | ICD-10-CM

## 2023-12-28 ENCOUNTER — Encounter: Payer: Self-pay | Admitting: Sleep Medicine

## 2023-12-28 ENCOUNTER — Ambulatory Visit: Admitting: Sleep Medicine

## 2023-12-28 VITALS — BP 118/70 | HR 73 | Temp 98.1°F | Wt 179.6 lb

## 2023-12-28 DIAGNOSIS — I25810 Atherosclerosis of coronary artery bypass graft(s) without angina pectoris: Secondary | ICD-10-CM

## 2023-12-28 DIAGNOSIS — G4733 Obstructive sleep apnea (adult) (pediatric): Secondary | ICD-10-CM | POA: Diagnosis not present

## 2023-12-28 DIAGNOSIS — F5112 Insufficient sleep syndrome: Secondary | ICD-10-CM

## 2023-12-28 NOTE — Progress Notes (Signed)
 Name:Willie Griffin MRN: [id number] DOB: 04-11-1955   CHIEF COMPLAINT:  CPAP F/U   HISTORY OF PRESENT ILLNESS: Willie Griffin is a 68 y.o. w/ a h/o OSA, CAD and hypothyroidism who presents for CPAP follow up visit. Reports using CPAP therapy every night, which is confirmed by compliance data. He is currently using the Airfit N30i nasal mask, which is comfortable. Reports feeling more refreshed upon awakening with CPAP therapy.     EPWORTH SLEEP SCORE    04/14/2023   10:14 AM  Results of the Epworth flowsheet  Sitting and reading 1  Watching TV 2  Sitting, inactive in a public place (e.g. a theatre or a meeting) 1  As a passenger in a car for an hour without a break 0  Lying down to rest in the afternoon when circumstances permit 2  Sitting and talking to someone 0  Sitting quietly after a lunch without alcohol 0  In a car, while stopped for a few minutes in traffic 0  Total score 6    PAST MEDICAL HISTORY :   has a past medical history of Allergy, GERD (gastroesophageal reflux disease), Hemorrhoid, Hyperlipidemia, Hypothyroidism, and Peyronie disease.  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 Prior to Admission medications   Medication Sig Start Date End Date Taking? Authorizing Provider  aspirin  EC 81 MG tablet Take 1 tablet (81 mg total) by mouth daily. Swallow whole. 02/28/20  Yes Agbor-Etang, Redell, MD  atorvastatin  (LIPITOR) 20 MG tablet TAKE 1 TABLET BY MOUTH EVERY DAY 08/13/23  Yes Agbor-Etang, Redell, MD  clopidogrel  (PLAVIX ) 75 MG tablet TAKE 1 TABLET BY MOUTH DAILY WITH BREAKFAST. 08/13/23  Yes Agbor-Etang, Redell, MD  Cyanocobalamin  (VITAMIN B-12 PO) Take 2 Doses by mouth daily.   Yes  [provider]  EPIPEN  2-PAK 0.3 MG/0.3ML SOAJ injection INJECT 0.3 MG INTRAMUSCULARLY ONCE AS NEEDED FOR UP TO 1 DOSE 12/03/21  Yes Jimmy Charlie FERNS, MD  HYDROcodone -acetaminophen  (NORCO/VICODIN) 5-325 MG tablet Take 1 tablet by mouth every 6 (six) hours as needed. 07/25/23 07/24/24 Yes Saunders Shona CROME, PA-C  hydrocortisone  2.5 % cream Use rectally twice daily as neeed 12/09/22  Yes Letvak, Richard I, MD  ibuprofen  (ADVIL ) 600 MG tablet Take 1 tablet (600 mg total) by mouth 3 (three) times daily. With food 07/25/23  Yes Summers, Rhonda L, PA-C  levothyroxine  (SYNTHROID ) 25 MCG tablet TAKE 1 TABLET BY MOUTH EVERY DAY BEFORE BREAKFAST 09/18/22  Yes Jimmy Charlie FERNS, MD  methocarbamol  (ROBAXIN ) 500 MG tablet 1-2 tablets every 6 hours prn muscle spasms 07/25/23  Yes Saunders, Rhonda L, PA-C  OVER THE COUNTER MEDICATION Take 2 capsules by mouth daily. Synoviox includes MSM, turmeric, and hyaluronic acid   Yes [provider]  pantoprazole  (PROTONIX ) 40 MG tablet TAKE 1 TABLET BY MOUTH EVERY DAY 02/16/23  Yes Letvak, Richard I, MD  SOOLANTRA 1 % CREA Apply topically daily. to face 01/15/17  Yes [provider]  tadalafil  (CIALIS ) 20 MG tablet Take 0.5-1 tablets (10-20 mg total) by mouth every other day as needed for erectile dysfunction. 10/07/22  Yes Jimmy Charlie FERNS, MD   Allergies  Allergen Reactions   Bee Venom Other (See Comments)   Ciprofloxacin Other (See Comments)    Sensations like hot and cold  are markedly pronounced and dermatologist told him he may be allergic.    FAMILY HISTORY:  family history includes Arthritis in his mother; Breast cancer in his paternal grandmother and another family member; Cancer in his paternal aunt and sister; Celiac disease in his paternal uncle; Coronary artery disease in his maternal grandfather and paternal grandfather; Dementia in his father; Heart attack in his father and mother; Heart disease in his father; Hodgkin's lymphoma in his  sister; Other in his paternal uncle and sister; Stroke in his maternal grandmother; Suicidality in his paternal grandmother. SOCIAL HISTORY:  reports that he has never smoked. He has been exposed to tobacco smoke. He has never used smokeless tobacco. He reports that he does not drink alcohol and does not use drugs.   Review of Systems:  Gen:  Denies  fever, sweats, chills weight loss  HEENT: Denies blurred vision, double vision, ear pain, eye pain, hearing loss, nose bleeds, sore throat Cardiac:  No dizziness, chest pain or heaviness, chest tightness,edema, No JVD Resp:   No cough, -sputum production, -shortness of breath,-wheezing, -hemoptysis,  Gi: Denies swallowing difficulty, stomach pain, nausea or vomiting, diarrhea, constipation, bowel incontinence Gu:  Denies bladder incontinence, burning urine Ext:   Denies Joint pain, stiffness or swelling Skin: Denies  skin rash, easy bruising or bleeding or hives Endoc:  Denies polyuria, polydipsia , polyphagia or weight change Psych:   Denies depression, insomnia or hallucinations  Other:  All other systems negative  VITAL SIGNS: BP 118/70   Pulse 73   Temp 98.1 F (36.7 C)   Wt 179 lb 9.6 oz (81.5 kg)   SpO2 96%   BMI 28.13 kg/m    Physical Examination:   General Appearance: No distress  EYES PERRLA, EOM intact.   NECK Supple, No JVD Pulmonary: normal breath sounds, No wheezing.  CardiovascularNormal S1,S2.  No m/r/g.   Abdomen: Benign, Soft, non-tender. Skin:   warm, no rashes, no ecchymosis  Extremities: normal, no cyanosis, clubbing. Neuro:without focal findings,  speech normal  PSYCHIATRIC: Mood, affect within normal limits.   ASSESSMENT AND PLAN  OSA Patient is using and benefiting from CPAP therapy. Discussed the consequences of untreated sleep apnea. Advised not to drive drowsy for safety of patient and others. Will follow up in 3 months.     CAD Stable, on current management. Following with PCP.   Insufficient  sleep time Counseled patient on increasing total sleep time to 7-8 hours nightly.    Patient satisfied with Plan of action and management. All questions answered  I spent a total of 22 minutes reviewing chart data, face-to-face evaluation with the patient, counseling and coordination of care as detailed above.    Carneshia Raker, M.D.  Sleep Medicine [facility]

## 2023-12-28 NOTE — Patient Instructions (Addendum)
 Patient Instructions  Continue to use CPAP every night, minimum of 7-8 hours a night.  Change equipment every 30 days or as directed by DME.  Wash your tubing with warm soap and water daily, hang to dry. Wash humidifier portion weekly. Use distilled water and change daily.  Be aware of reduced alertness and do not drive or operate heavy machinery if experiencing this or drowsiness.  Exercise encouraged, as tolerated. Encouraged proper weight management.  Important to get eight or more hours of sleep  Limiting the use of the computer and television before bedtime.  Decrease naps during the day, so night time sleep will become enhanced.  Limit caffeine, and sleep deprivation.  HTN, stroke, uncontrolled diabetes and heart failure are potential risk factors.  Risk of untreated sleep apnea including cardiac arrhthymias, stroke, DM, pulm HTN.

## 2023-12-28 NOTE — Therapy (Unsigned)
 OUTPATIENT PHYSICAL THERAPY TREATMENT   Patient Name: Willie Griffin MRN: [id number] DOB:[birth year], 68 y.o., male Today's Date: 12/29/2023  END OF SESSION:  PT End of Session - 12/29/23 0954     Visit Number 3    Number of Visits 25    Date for Recertification  03/08/24    Authorization Type MEDICARE PART B reporting period from 12/15/2023    Progress Note Due on Visit 10    PT Start Time 0953    PT Stop Time 1036    PT Time Calculation (min) 43 min    Activity Tolerance Patient tolerated treatment well           Past Medical History:  Diagnosis Date   Allergy    allergic rhinitis   GERD (gastroesophageal reflux disease)    Hemorrhoid    Hyperlipidemia    Hypothyroidism    Peyronie disease    Past Surgical History:  Procedure Laterality Date   BACK SURGERY     COLONOSCOPY     CORONARY STENT INTERVENTION N/A 08/21/2022   Procedure: CORONARY STENT INTERVENTION;  Surgeon: Darron Deatrice LABOR, MD;  Location: [facility];  Service: Cardiovascular;  Laterality: N/A;   HERNIA REPAIR     INGUINAL HERNIA REPAIR  09/2003   LEFT HEART CATH AND CORONARY ANGIOGRAPHY Left 08/21/2022   Procedure: LEFT HEART CATH AND CORONARY ANGIOGRAPHY;  Surgeon: Darron Deatrice LABOR, MD;  Location: [facility];  Service: Cardiovascular;  Laterality: Left;   MOHS SURGERY     nasal SCC----[facility]   SPINE SURGERY     TOENAIL EXCISION     partial   TONSILLECTOMY  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     Patient Active Problem List   Diagnosis Date Noted   Prediabetes 12/11/2023   Atypical pneumonia 02/09/2023   Hyperlipidemia LDL goal <70 08/22/2022   Stable angina 08/21/2022   Abnormal cardiac CT angiography 08/21/2022   Coronary artery disease of native artery of native heart with stable angina pectoris 08/21/2022   Coronary artery calcification 11/23/2020   Spondylosis of lumbosacral region without myelopathy or radiculopathy 01/14/2018   GERD (gastroesophageal reflux  disease) 10/20/2017   Chronic back pain 03/17/2017   Hypothyroidism    Lumbar radiculopathy 04/24/2015   Rosacea 10/21/2013   Allergic rhinitis 03/17/2007   Peyronie's disease 02/19/2007   PCP: Bennett Reuben MARLA, MD  REFERRING PROVIDER: Bennett Reuben MARLA, MD  REFERRING DIAG: Chronic midline low back pain with bilateral sciatica  Rationale for Evaluation and Treatment: Rehabilitation  THERAPY DIAG:  Sciatica, right side  Other low back pain  Radiculopathy, thoracolumbar region  Sciatica, left side  ONSET DATE: over 10 years ago, insidious onset with worsening symptoms  SUBJECTIVE:  PERTINENT HISTORY:  Patient is a 68 y.o. male who presents to outpatient physical therapy with a referral for medical diagnosis Chronic midline low back pain with bilateral sciatica. This patient's chief complaints consist of chronic low back with occasional numbness and tingling down B LE into the feet, leading to the following functional deficits: bending, sitting, standing and laying. 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 he  has a past medical history of Allergy, GERD (gastroesophageal reflux disease), Hemorrhoid, Hyperlipidemia, Hypothyroidism, and Peyronie disease. 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 Patient denies hx of stroke, seizures, lung problems, diabetes, unexplained weight loss, unexplained changes in bowel or bladder problems, and osteoporosis  SUBJECTIVE STATEMENT: Patient stated after last session he had a constant ache that nothing really helped. Did some of his exercises over the holiday but not everyday. PAIN:   NPRS: pt denies pain today  From initial visit: Are you having pain? Yes Pain location: Normally his L side is worst than the R in terms of numbness and tingling, pain in the low back is present across flank of low back.  Patient also reports his knees has occasionally giving out on him but he believes it is not related. Has intermittent numbness/tingling down back of both legs to bottoms of feet (but not present currently) Pain description: numbness and tingling in feet NPRS: Current: 0/10 but noticed it when he sat down 1/10  Worst: 10/10   Aggravating: sitting, standing, laying  Best: 0/10   Relieving: can be sitting standing and or laying  PRECAUTIONS: None  RED FLAGS: No red flags   WEIGHT BEARING RESTRICTIONS: No  FALLS:  Has patient fallen in last 6 months? Yes. 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   STM to L lower paraspinals 3 min Noted trigger point to the L of L3 that loosened upon completion of STM Education about dry needling   Therapeutic exercise: therapeutic exercises that incorporate ONE parameter at one or more areas of the body to centralize symptoms, develop strength and endurance, range of motion, and flexibility required for successful completion of functional activities.  Hooklying Lower trunk rotations to reduce pain in lumbar spine musculature  1 x 10  Discontinued due to increase pain on L side   Standing hip abductions with UE support on TM Improve LE  strength  2 x 10 each side with 2# ankle weights Cuing for hold time and form  Standing hip flexor stretch to reduce tension  2 x 15 seconds each side    Seated piriformis stretch to reduce tension   2 x 30 seconds each side   Front Plank on mat    2 x 15 seconds  Education on benefits, purpose and techniques used in dry needling. Handout given.  Neuromuscular Re-education: a technique or exercise performed with the goal of improving the level of communication between the body and the brain, such as for balance, motor control, muscle activation patterns, coordination, desensitization, quality of muscle contraction, proprioception, and/or kinesthetic sense needed for successful and safe completion of functional activities.   Hooklying PPT with bridging to improve lumbopelvic control with movment   2 x 10   Hooklying PPT to impove lumbo pelvic control   2 x 10   Cued patient to complete in a pain free range   Tall kneeling reverse wood chops to improve core strength and balance    1 x 10 each side GTB   1 x 10 each side Blue TB  Pt required multimodal cuing for proper technique and to facilitate improved neuromuscular control, strength, range of motion, and functional ability resulting in improved performance  and form.   PATIENT EDUCATION:  Education details: Exercise purpose/form. Self management techniques. Education on diagnosis, prognosis, POC, anatomy and physiology of current condition. Education on HEP including handout. Person educated: Patient Education method: Explanation, Demonstration, Tactile cues, Verbal cues, and Handouts Education comprehension: verbalized understanding, returned demonstration, and verbal cues required  HOME EXERCISE PROGRAM: Access Code: [email or web address] URL: [email or web address] Date: 12/17/2023 Prepared by: Vernell Rise  Exercises - Supine Posterior Pelvic Tilt  - 2 x daily - 3 sets - 10 reps - Supine Lower Trunk Rotation  - 2 x daily - 3 sets - 10 reps - Supine Bridge  - 2 x daily - 3 sets - 10 reps - Seated Piriformis Stretch  - 2 x daily - 2 sets - 30 hold - Hip Flexor Stretch with Chair  - 2 x daily - 2 sets - 15 hold  ASSESSMENT:  CLINICAL IMPRESSION: Patient noted some pain in his low back with going from sit to supine that alleviated a few seconds later after being still. When completing LTR patient noted some pain on his L side, exercise was discontinued. Patient noted increase L knee pain with hip abductions on the L side. Completed tall kneeling reverse wood chops to improve core strength and balance. Patient noted decrease difficulty with GTB and progressed his final set to a blue TB for increase difficulty. Patient asked a question regarding dry needling and was educated on the technique, benefits and purpose of dry needling and was given a handout if he is interested in learning more and potentially trying it in a future session. Patient reported no change in his pain upon completion of session today. Patient will benefit from skilled physical therapy intervention to address current body structure impairments and activity limitations to improve function and work towards goals set in current POC in order to return to prior level of  function or maximal functional improvement.   OBJECTIVE IMPAIRMENTS: decreased ROM, hypomobility, impaired flexibility, pain, and motor control, knowledge of condition, neural tension.   ACTIVITY LIMITATIONS: bending, sitting, standing, and sleeping  PARTICIPATION LIMITATIONS: patient states there a day where he can do anything without pain and others when he can't do anything.  PERSONAL FACTORS: Past/current experiences, Time since onset of injury/illness/exacerbation, 3+ comorbidities: coronary artery calcification, stable angina, GERD and hx of spinal tumor, and level of engagement in PT process are also affecting patient's functional outcome.   REHAB POTENTIAL: Good  CLINICAL DECISION MAKING: Stable/uncomplicated  EVALUATION COMPLEXITY: Low  GOALS: Goals reviewed with patient? No  SHORT TERM GOALS: Target date: 12/29/2023  Patient will be independent with initial home exercise program for self-management of symptoms. Baseline: Initial HEP provided at IE (12/15/23); Goal status:  in progress  LONG TERM GOALS: Target date: 03/08/2024  Patient will be independent with a long-term home exercise program for self-management of symptoms.  Baseline: Initial HEP provided at IE (12/15/23); Goal status:  in progress  2.  Patient will improve active lumbar ext and bilateral side bending to >75% to improve tension and movement of the spine to reduce aggravating his symptoms. Baseline: Ext/R Side bending/L Side bending: 25% (12/15/23); Goal status:  in progress  3.  Patient will articulate what makes his pain better or worse to demonstrate improve awareness of his condition behavior to improve ability to participate in functional activities with less irritation. Baseline: unable to articulate clearly at this time (12/15/23); Goal status:  in progress  4.  Patient will demonstrate improvement in Patient Specific Functional Scale (PSFS) of equal or greater than 8/10 points to reflect  clinically significant improvement in patient's most valued functional activities. Baseline: to be measured at visit 2 as appropriate (12/15/23); 5/10 (12/17/23) Goal status:  in progress  5.  Patient will report NPRS equal or less than 3/10 during functional activities during the last 2 weeks to improve their abilitly to complete community, work and/or recreational activities with less limitation. Baseline: 10/10 (12/15/23); Goal status: in progress  PLAN:  PT FREQUENCY: 2x/week  PT DURATION: 12 weeks  PLANNED INTERVENTIONS: 97164- PT Re-evaluation, 97750- Physical Performance Testing, 97110-Therapeutic exercises, 97530- Therapeutic activity, [id number]- Neuromuscular re-education, 97535- Self Care, 02859- Manual therapy, [id number]- Electrical stimulation (unattended), 20560 (1-2 muscles), 20561 (3+ muscles)- Dry Needling, Patient/Family education, Balance training, Spinal mobilization, Cryotherapy, and Moist heat.  PLAN FOR NEXT SESSION:  Continue working on hip strength, balance and anti extension posturing. Consider adding more core strengthen exercises such as pallof press and test psoas length. Update HEP as appropriate. Manual therapy and dry needling as needed.  Vernell Rise, SPT 12/29/23   Camie SAUNDERS. Juli, PT, DPT, Cert. MDT 12/29/23, 1:15 PM  [facility] [address] [place], [place] [address] P: [phone] I F: [phone]

## 2023-12-29 ENCOUNTER — Ambulatory Visit: Admitting: Physical Therapy

## 2023-12-29 ENCOUNTER — Encounter: Payer: Self-pay | Admitting: Physical Therapy

## 2023-12-29 DIAGNOSIS — M5432 Sciatica, left side: Secondary | ICD-10-CM | POA: Diagnosis present

## 2023-12-29 DIAGNOSIS — M5431 Sciatica, right side: Secondary | ICD-10-CM | POA: Insufficient documentation

## 2023-12-29 DIAGNOSIS — M5459 Other low back pain: Secondary | ICD-10-CM | POA: Insufficient documentation

## 2023-12-29 DIAGNOSIS — M5415 Radiculopathy, thoracolumbar region: Secondary | ICD-10-CM | POA: Diagnosis present

## 2023-12-30 NOTE — Therapy (Unsigned)
 OUTPATIENT PHYSICAL THERAPY TREATMENT   Patient Name: Willie Griffin MRN: [id number] DOB:07/17/1955, 68 y.o., male Today's Date: 12/31/2023  END OF SESSION:  PT End of Session - 12/31/23 1115     Visit Number 4    Number of Visits 25    Date for Recertification  03/08/24    Authorization Type MEDICARE PART B reporting period from 12/15/2023    Progress Note Due on Visit 10    PT Start Time 1116    PT Stop Time 1157    PT Time Calculation (min) 41 min    Activity Tolerance Patient tolerated treatment well         Past Medical History:  Diagnosis Date   Allergy    allergic rhinitis   GERD (gastroesophageal reflux disease)    Hemorrhoid    Hyperlipidemia    Hypothyroidism    Peyronie disease    Past Surgical History:  Procedure Laterality Date   BACK SURGERY     COLONOSCOPY     CORONARY STENT INTERVENTION N/A 08/21/2022   Procedure: CORONARY STENT INTERVENTION;  Surgeon: Darron Deatrice LABOR, MD;  Location: [facility];  Service: Cardiovascular;  Laterality: N/A;   HERNIA REPAIR     INGUINAL HERNIA REPAIR  09/2003   LEFT HEART CATH AND CORONARY ANGIOGRAPHY Left 08/21/2022   Procedure: LEFT HEART CATH AND CORONARY ANGIOGRAPHY;  Surgeon: Darron Deatrice LABOR, MD;  Location: [facility];  Service: Cardiovascular;  Laterality: Left;   MOHS SURGERY     nasal SCC----[facility]   SPINE SURGERY     TOENAIL EXCISION     partial   TONSILLECTOMY  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     Patient Active Problem List   Diagnosis Date Noted   Prediabetes 12/11/2023   Atypical pneumonia 02/09/2023   Hyperlipidemia LDL goal <70 08/22/2022   Stable angina 08/21/2022   Abnormal cardiac CT angiography 08/21/2022   Coronary artery disease of native artery of native heart with stable angina pectoris 08/21/2022   Coronary artery calcification 11/23/2020   Spondylosis of lumbosacral region without myelopathy or radiculopathy 01/14/2018   GERD (gastroesophageal reflux disease)  10/20/2017   Chronic back pain 03/17/2017   Hypothyroidism    Lumbar radiculopathy 04/24/2015   Rosacea 10/21/2013   Allergic rhinitis 03/17/2007   Peyronie's disease 02/19/2007   PCP: Bennett Reuben MARLA, MD  REFERRING PROVIDER: Bennett Reuben MARLA, MD  REFERRING DIAG: Chronic midline low back pain with bilateral sciatica  Rationale for Evaluation and Treatment: Rehabilitation  THERAPY DIAG:  Sciatica, right side  Other low back pain  Radiculopathy, thoracolumbar region  Sciatica, left side  ONSET DATE: over 10 years ago, insidious onset with worsening symptoms  SUBJECTIVE:  PERTINENT HISTORY:  Patient is a 68 y.o. male who presents to outpatient physical therapy with a referral for medical diagnosis Chronic midline low back pain with bilateral sciatica. This patient's chief complaints consist of chronic low back with occasional numbness and tingling down B LE into the feet, leading to the following functional deficits: bending, sitting, standing and laying. 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 he  has a past medical history of Allergy, GERD (gastroesophageal reflux disease), Hemorrhoid, Hyperlipidemia, Hypothyroidism, and Peyronie disease. 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 Patient denies hx of stroke, seizures, lung problems, diabetes, unexplained weight loss, unexplained changes in bowel or bladder problems, and osteoporosis  SUBJECTIVE STATEMENT: Patient stated he is sore today and thinks he slept wrong. Hasn't really paid attention to his back pain but thinks it has remained unchanged with his daily activities. Pain has been mainly staying the low back, few occasional where pain burned into the hamstring but not passed the knee. PAIN:   NPRS:1-2/10 in the low back  From initial visit: Are you having pain? Yes Pain location: Normally his L side is worst than the R in terms of numbness and tingling, pain in the low back is present across flank of low back.  Patient also reports his knees has occasionally giving out on him but he believes it is not related. Has intermittent numbness/tingling down back of both legs to bottoms of feet (but not present currently) Pain description: numbness and tingling in feet NPRS: Current: 0/10 but noticed it when he sat down 1/10  Worst: 10/10   Aggravating: sitting, standing, laying  Best: 0/10   Relieving: can be sitting standing and or laying  PRECAUTIONS: None  RED FLAGS: No red flags   WEIGHT BEARING RESTRICTIONS: No  FALLS:  Has patient fallen in last 6 months? Yes. 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    Prone lumbar mobilizations grade 2-3 CPA (L3 - L4), oscillatory 1 bouts of 60 second oscillations with 20 seconds rest between each level Pt noted pain with initial pressing that relieved with repetitions and into grade 3  STM to L lower paraspinals 2 min Noted small  dime sized trigger point to the L of L3 that loosened upon completion of STM  Therapeutic exercise: therapeutic exercises that incorporate ONE parameter at  one or more areas of the body to centralize symptoms, develop strength and endurance, range of motion, and flexibility required for successful completion of functional activities.   Standing hip abductions with UE support on TM Improve LE strength  2 x 10 each side with 2# ankle weights   Seated piriformis stretch to reduce tension   2 x 30 seconds each side   Pt noted L side felt like a good pain that alleviated after first set   Schering-plough on mat table   1 min 4 sec hold    Pt cued to hold as long as he could   Hooklying LTR to reduce tension   1 x 10 each side  Neuromuscular Re-education: a technique or exercise performed with the goal of improving the level of communication between the body and the brain, such as for balance, motor control, muscle activation patterns, coordination, desensitization, quality of muscle contraction, proprioception, and/or kinesthetic sense needed for successful and safe completion of functional activities.   Hooklying PPT with bridging to improve lumbopelvic control with movment   2 x 10   Hooklying PPT to impove lumbo pelvic control   2 x 10   Cued patient to complete in a pain free range   Hooklying PPT with B weighted arm raise to improve lumbo pelvic control   1 x 10 8#DB   1 x 10  7#DB   Tall kneeling reverse wood chops to improve core strength and balance    2 x 10 each side Blue TB   Standing resisted pallof press, 10# cable to improve core strength while maintaining lumbar anti-extension posturing  2 x 10  Quadruped hip hikes on mat table to improve coordination and stability in low back  1 x 10 each side  Pt required multimodal cuing for proper technique and to facilitate improved neuromuscular control, strength, range of motion, and functional ability resulting in improved performance and form.  41 min total: 5 min MT, 12 min TE, 24 min NR  PATIENT EDUCATION:  Education details: Exercise purpose/form. Self management techniques. Education on diagnosis, prognosis, POC, anatomy and physiology of current condition. Education on HEP including handout. Person educated: Patient Education method: Explanation, Demonstration, Tactile cues, Verbal cues, and Handouts Education comprehension: verbalized understanding, returned demonstration, and verbal cues required  HOME EXERCISE PROGRAM: Access Code: [email or web address] URL: [email or web address] Date: 12/31/2023 Prepared by: Vernell Rise  Exercises - Supine Posterior Pelvic Tilt  - 2 x daily - 3 sets - 10 reps - Supine Lower Trunk Rotation  - 2 x daily - 3 sets - 10 reps - Supine Bridge  - 2 x daily - 3 sets - 10 reps - Seated Piriformis Stretch  - 2 x daily - 2 sets - 30 hold - Hip Flexor Stretch with Chair  - 2 x daily - 2 sets - 15 hold - Standard Plank  - 1 x daily - 2 sets - 30 second hold  ASSESSMENT:  CLINICAL IMPRESSION: Completed weighted arm raises in hooklying with PPT to help improve lumbo pelvic control with UE movements. Patient noted increase difficulty with the 8# DB and had to switch to 7# for second set to be able to complete. When doing the seated piriformis stretch the patient noted a good pain that alleviated after first set. Completed resisted standing pallof press to strengthen  the core while maintain lumbo pelvic control and stability. Returned to prone lumbar CPA at a grade 2-3 on L3 -L4. Patient noted pain with  initial few mobilizations that alleviated to a 1/10 with repetitions and as we got into grade 3. Pain remained a 1/10 upon completion of session today. Patient will benefit from skilled physical therapy intervention to address current body structure impairments and activity limitations to improve function and work towards goals set in current POC in order to return to prior level of function or maximal functional improvement.   OBJECTIVE IMPAIRMENTS: decreased ROM, hypomobility, impaired flexibility, pain, and motor control, knowledge of condition, neural tension.   ACTIVITY LIMITATIONS: bending, sitting, standing, and sleeping  PARTICIPATION LIMITATIONS: patient states there a day where he can do anything without pain and others when he can't do anything.  PERSONAL FACTORS: Past/current experiences, Time since onset of injury/illness/exacerbation, 3+ comorbidities: coronary artery calcification, stable angina, GERD and hx of spinal tumor, and level of engagement in PT process are also affecting patient's functional outcome.   REHAB POTENTIAL: Good  CLINICAL DECISION MAKING: Stable/uncomplicated  EVALUATION COMPLEXITY: Low  GOALS: Goals reviewed with patient? No  SHORT TERM GOALS: Target date: 12/29/2023  Patient will be independent with initial home exercise program for self-management of symptoms. Baseline: Initial HEP provided at IE (12/15/23); Goal status:  in progress  LONG TERM GOALS: Target date: 03/08/2024  Patient will be independent with a long-term home exercise program for self-management of symptoms.  Baseline: Initial HEP provided at IE (12/15/23); Goal status:  in progress  2.  Patient will improve active lumbar ext and bilateral side bending to >75% to improve tension and movement of the spine to reduce aggravating his  symptoms. Baseline: Ext/R Side bending/L Side bending: 25% (12/15/23); Goal status:  in progress  3.  Patient will articulate what makes his pain better or worse to demonstrate improve awareness of his condition behavior to improve ability to participate in functional activities with less irritation. Baseline: unable to articulate clearly at this time (12/15/23); Goal status:  in progress  4.  Patient will demonstrate improvement in Patient Specific Functional Scale (PSFS) of equal or greater than 8/10 points to reflect clinically significant improvement in patient's most valued functional activities. Baseline: to be measured at visit 2 as appropriate (12/15/23); 5/10 (12/17/23) Goal status:  in progress  5.  Patient will report NPRS equal or less than 3/10 during functional activities during the last 2 weeks to improve their abilitly to complete community, work and/or recreational activities with less limitation. Baseline: 10/10 (12/15/23); Goal status: in progress  PLAN:  PT FREQUENCY: 2x/week  PT DURATION: 12 weeks  PLANNED INTERVENTIONS: 97164- PT Re-evaluation, 97750- Physical Performance Testing, 97110-Therapeutic exercises, 97530- Therapeutic activity, [id number]- Neuromuscular re-education, 97535- Self Care, [id number]- Manual therapy, [id number]- Electrical stimulation (unattended), 20560 (1-2 muscles), 20561 (3+ muscles)- Dry Needling, Patient/Family education, Balance training, Spinal mobilization, Cryotherapy, and Moist heat.  PLAN FOR NEXT SESSION:  Continue working on hip strength, balance and anti extension posturing. Return to prone lumbar mobilizations. Consider adding 5# cable to reverse wood chops in tall kneeling. Update HEP as appropriate. Manual therapy and dry needling as needed.  Vernell Rise, SPT 12/31/23   Camie SAUNDERS. Juli, PT, DPT, Cert. MDT 12/31/23, 4:39 PM  [facility] [address] [place], [place] [address] P: [phone] I F:  [phone]

## 2023-12-31 ENCOUNTER — Ambulatory Visit: Admitting: Physical Therapy

## 2023-12-31 ENCOUNTER — Encounter: Payer: Self-pay | Admitting: Physical Therapy

## 2023-12-31 DIAGNOSIS — M5415 Radiculopathy, thoracolumbar region: Secondary | ICD-10-CM

## 2023-12-31 DIAGNOSIS — M5431 Sciatica, right side: Secondary | ICD-10-CM | POA: Diagnosis not present

## 2023-12-31 DIAGNOSIS — M5432 Sciatica, left side: Secondary | ICD-10-CM

## 2023-12-31 DIAGNOSIS — M5459 Other low back pain: Secondary | ICD-10-CM

## 2024-01-04 NOTE — Therapy (Deleted)
 OUTPATIENT PHYSICAL THERAPY TREATMENT   Patient Name: Willie Griffin MRN: [id number] DOB:Nov 07, 1955, 68 y.o., male Today's Date: 01/04/2024  END OF SESSION:   Past Medical History:  Diagnosis Date   Allergy    allergic rhinitis   GERD (gastroesophageal reflux disease)    Hemorrhoid    Hyperlipidemia    Hypothyroidism    Peyronie disease    Past Surgical History:  Procedure Laterality Date   BACK SURGERY     COLONOSCOPY     CORONARY STENT INTERVENTION N/A 08/21/2022   Procedure: CORONARY STENT INTERVENTION;  Surgeon: Darron Deatrice LABOR, MD;  Location: [facility];  Service: Cardiovascular;  Laterality: N/A;   HERNIA REPAIR     INGUINAL HERNIA REPAIR  09/2003   LEFT HEART CATH AND CORONARY ANGIOGRAPHY Left 08/21/2022   Procedure: LEFT HEART CATH AND CORONARY ANGIOGRAPHY;  Surgeon: Darron Deatrice LABOR, MD;  Location: [facility];  Service: Cardiovascular;  Laterality: Left;   MOHS SURGERY     nasal SCC----[facility]   SPINE SURGERY     TOENAIL EXCISION     partial   TONSILLECTOMY  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     Patient Active Problem List   Diagnosis Date Noted   Prediabetes 12/11/2023   Atypical pneumonia 02/09/2023   Hyperlipidemia LDL goal <70 08/22/2022   Stable angina 08/21/2022   Abnormal cardiac CT angiography 08/21/2022   Coronary artery disease of native artery of native heart with stable angina pectoris 08/21/2022   Coronary artery calcification 11/23/2020   Spondylosis of lumbosacral region without myelopathy or radiculopathy 01/14/2018   GERD (gastroesophageal reflux disease) 10/20/2017   Chronic back pain 03/17/2017   Hypothyroidism    Lumbar radiculopathy 04/24/2015   Rosacea 10/21/2013   Allergic rhinitis 03/17/2007   Peyronie's disease 02/19/2007   PCP: Bennett Reuben MARLA, MD  REFERRING PROVIDER: Bennett Reuben MARLA, MD  REFERRING DIAG: Chronic midline low back pain with bilateral sciatica  Rationale for Evaluation and Treatment:  Rehabilitation  THERAPY DIAG:  Sciatica, right side  Other low back pain  Radiculopathy, thoracolumbar region  Sciatica, left side  ONSET DATE: over 10 years ago, insidious onset with worsening symptoms  SUBJECTIVE:                                                                                                                                                                                           PERTINENT HISTORY:  Patient is a 68 y.o. male who presents to outpatient physical therapy with a referral for medical diagnosis Chronic midline low back pain with bilateral sciatica. This patient's chief complaints consist of  chronic low back with occasional numbness and tingling down B LE into the feet, leading to the following functional deficits: bending, sitting, standing and laying. 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 he  has a past medical history of Allergy, GERD (gastroesophageal reflux disease), Hemorrhoid, Hyperlipidemia, Hypothyroidism, and Peyronie disease. 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 Patient denies hx of stroke, seizures, lung problems, diabetes, unexplained weight loss, unexplained changes in  bowel or bladder problems, and osteoporosis  SUBJECTIVE STATEMENT: *** PAIN:   NPRS:***  From initial visit: Are you having pain? Yes Pain location: Normally his L side is worst than the R in terms of numbness and tingling, pain in the low back is present across flank of low back.  Patient also reports his knees has occasionally giving out on him but he believes it is not related. Has intermittent numbness/tingling down back of both legs to bottoms of feet (but not present currently) Pain description: numbness and tingling in feet NPRS: Current: 0/10 but noticed it when he sat down 1/10  Worst: 10/10   Aggravating: sitting, standing, laying  Best: 0/10   Relieving: can be sitting standing and or laying  PRECAUTIONS: None  RED FLAGS: No red flags   WEIGHT BEARING RESTRICTIONS: No  FALLS:  Has patient fallen in last 6 months? Yes. 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    Prone lumbar mobilizations grade 2-3 CPA (L3 - L4), oscillatory 1 bouts of 60 second oscillations with 20 seconds rest between each level Pt noted pain with initial pressing that relieved with repetitions and into grade 3  STM to L lower paraspinals 2 min Noted small  dime sized trigger point to the L of L3 that loosened upon completion of STM  Therapeutic exercise: therapeutic exercises that incorporate ONE parameter at one or  more areas of the body to centralize symptoms, develop strength and endurance, range of motion, and flexibility required for successful completion of functional activities.   Standing hip abductions with UE support on TM Improve LE strength  2 x 10 each side with 2# ankle weights   Seated piriformis stretch to reduce tension   2 x 30 seconds each side Pt noted L side felt like a good pain that alleviated after first set   Schering-plough on mat table  1 min 4 sec hold    Pt cued to hold as long as he could  Neuromuscular Re-education: a technique or exercise performed with the goal of improving the level of communication between the body and the brain, such as for balance, motor control, muscle activation patterns, coordination, desensitization, quality of muscle contraction, proprioception, and/or kinesthetic sense needed for successful and safe completion of functional activities.   Hooklying PPT with bridging to improve lumbopelvic control with movment   2 x 10  Hooklying PPT with B weighted arm raise to improve lumbo pelvic control   2 x 10 7#DB  Tall kneeling reverse wood chops to improve core strength and balance    2 x 10 each side Blue TB   Standing resisted pallof press, 10# cable to improve core strength while maintaining lumbar anti-extension posturing  2 x 10  Quadruped hip hikes on mat table to improve coordination and stability in low back  1 x 10 each side  Pt required multimodal cuing for proper technique and to facilitate improved neuromuscular control, strength, range of motion, and functional ability resulting in improved performance and form.   PATIENT EDUCATION:  Education details: Exercise purpose/form. Self management techniques. Education on diagnosis, prognosis, POC, anatomy and physiology of current condition. Education on HEP including handout. Person educated: Patient Education method: Explanation, Demonstration, Tactile cues, Verbal cues, and  Handouts Education comprehension: verbalized understanding, returned demonstration, and verbal cues required  HOME EXERCISE PROGRAM: Access Code: [email or web address] URL: [email or web address] Date: 12/31/2023 Prepared by: Vernell Rise  Exercises - Supine Posterior Pelvic Tilt  - 2 x daily - 3 sets - 10 reps - Supine Lower Trunk Rotation  - 2 x daily - 3 sets - 10 reps - Supine Bridge  - 2 x daily - 3 sets - 10 reps - Seated Piriformis Stretch  - 2 x daily - 2 sets - 30 hold - Hip Flexor Stretch with Chair  - 2 x daily - 2 sets - 15 hold - Standard Plank  - 1 x daily - 2 sets - 30 second hold  ASSESSMENT:  CLINICAL IMPRESSION: ***  Patient will benefit from skilled physical therapy intervention to address current body structure impairments and activity limitations to improve function and work towards goals set in current POC in order to return to prior level of function or maximal functional improvement.   OBJECTIVE IMPAIRMENTS: decreased ROM, hypomobility, impaired flexibility, pain, and motor control, knowledge of condition, neural tension.   ACTIVITY LIMITATIONS: bending, sitting, standing, and sleeping  PARTICIPATION LIMITATIONS: patient states there a day where he can do anything without pain and others when he can't do anything.  PERSONAL FACTORS: Past/current experiences, Time since onset of injury/illness/exacerbation, 3+ comorbidities: coronary artery calcification, stable angina, GERD and hx of spinal tumor, and level of engagement in PT process are also affecting patient's functional outcome.   REHAB POTENTIAL: Good  CLINICAL DECISION MAKING: Stable/uncomplicated  EVALUATION COMPLEXITY: Low  GOALS: Goals reviewed with patient? No  SHORT TERM GOALS: Target date: 12/29/2023  Patient will be independent with initial home exercise program for self-management of symptoms. Baseline: Initial HEP provided at IE (12/15/23); Goal status:  in progress  LONG TERM  GOALS: Target date: 03/08/2024  Patient will be independent with a long-term home exercise program for self-management of symptoms.  Baseline: Initial HEP provided at IE (12/15/23); Goal status:  in progress  2.  Patient will improve active lumbar ext and bilateral side bending to >75% to improve tension and movement  of the spine to reduce aggravating his symptoms. Baseline: Ext/R Side bending/L Side bending: 25% (12/15/23); Goal status:  in progress  3.  Patient will articulate what makes his pain better or worse to demonstrate improve awareness of his condition behavior to improve ability to participate in functional activities with less irritation. Baseline: unable to articulate clearly at this time (12/15/23); Goal status:  in progress  4.  Patient will demonstrate improvement in Patient Specific Functional Scale (PSFS) of equal or greater than 8/10 points to reflect clinically significant improvement in patient's most valued functional activities. Baseline: to be measured at visit 2 as appropriate (12/15/23); 5/10 (12/17/23) Goal status:  in progress  5.  Patient will report NPRS equal or less than 3/10 during functional activities during the last 2 weeks to improve their abilitly to complete community, work and/or recreational activities with less limitation. Baseline: 10/10 (12/15/23); Goal status: in progress  PLAN:  PT FREQUENCY: 2x/week  PT DURATION: 12 weeks  PLANNED INTERVENTIONS: 97164- PT Re-evaluation, 97750- Physical Performance Testing, 97110-Therapeutic exercises, 97530- Therapeutic activity, [id number]- Neuromuscular re-education, 97535- Self Care, [id number]- Manual therapy, [id number]- Electrical stimulation (unattended), 20560 (1-2 muscles), 20561 (3+ muscles)- Dry Needling, Patient/Family education, Balance training, Spinal mobilization, Cryotherapy, and Moist heat.  PLAN FOR NEXT SESSION:  *** Continue working on hip strength, balance and anti extension posturing. Return to  prone lumbar mobilizations. Consider adding 5# cable to reverse wood chops in tall kneeling. Update HEP as appropriate. Manual therapy and dry needling as needed.  Vernell Rise, SPT 01/04/24    [facility] [address] [place], [place] [address] P: [phone] I F: [phone]

## 2024-01-05 ENCOUNTER — Ambulatory Visit: Admitting: Physical Therapy

## 2024-01-05 DIAGNOSIS — M5432 Sciatica, left side: Secondary | ICD-10-CM

## 2024-01-05 DIAGNOSIS — M5459 Other low back pain: Secondary | ICD-10-CM

## 2024-01-05 DIAGNOSIS — M5415 Radiculopathy, thoracolumbar region: Secondary | ICD-10-CM

## 2024-01-05 DIAGNOSIS — M5431 Sciatica, right side: Secondary | ICD-10-CM
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
## 2024-01-07 ENCOUNTER — Encounter: Payer: Self-pay | Admitting: Physical Therapy

## 2024-01-07 ENCOUNTER — Ambulatory Visit: Admitting: Physical Therapy

## 2024-01-07 DIAGNOSIS — M5415 Radiculopathy, thoracolumbar region: Secondary | ICD-10-CM

## 2024-01-07 DIAGNOSIS — M5431 Sciatica, right side: Secondary | ICD-10-CM | POA: Diagnosis not present

## 2024-01-07 DIAGNOSIS — M5432 Sciatica, left side: Secondary | ICD-10-CM

## 2024-01-07 DIAGNOSIS — M5459 Other low back pain: Secondary | ICD-10-CM

## 2024-01-11 NOTE — Therapy (Unsigned)
 OUTPATIENT PHYSICAL THERAPY TREATMENT   Patient Name: Willie Griffin MRN: [id number] DOB:1955/05/29, 68 y.o., male Today's Date: 01/12/2024  END OF SESSION:  PT End of Session - 01/12/24 0900     Visit Number 6    Number of Visits 25    Date for Recertification  03/08/24    Authorization Type MEDICARE PART B reporting period from 12/15/2023    Progress Note Due on Visit 10    PT Start Time 0902    PT Stop Time 0943    PT Time Calculation (min) 41 min    Activity Tolerance Patient tolerated treatment well         Past Medical History:  Diagnosis Date   Allergy    allergic rhinitis   GERD (gastroesophageal reflux disease)    Hemorrhoid    Hyperlipidemia    Hypothyroidism    Peyronie disease    Past Surgical History:  Procedure Laterality Date   BACK SURGERY     COLONOSCOPY     CORONARY STENT INTERVENTION N/A 08/21/2022   Procedure: CORONARY STENT INTERVENTION;  Surgeon: Darron Deatrice LABOR, MD;  Location: [facility];  Service: Cardiovascular;  Laterality: N/A;   HERNIA REPAIR     INGUINAL HERNIA REPAIR  09/2003   LEFT HEART CATH AND CORONARY ANGIOGRAPHY Left 08/21/2022   Procedure: LEFT HEART CATH AND CORONARY ANGIOGRAPHY;  Surgeon: Darron Deatrice LABOR, MD;  Location: [facility];  Service: Cardiovascular;  Laterality: Left;   MOHS SURGERY     nasal SCC----[facility]   SPINE SURGERY     TOENAIL EXCISION     partial   TONSILLECTOMY  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     Patient Active Problem List   Diagnosis Date Noted   Prediabetes 12/11/2023   Atypical pneumonia 02/09/2023   Hyperlipidemia LDL goal <70 08/22/2022   Stable angina 08/21/2022   Abnormal cardiac CT angiography 08/21/2022   Coronary artery disease of native artery of native heart with stable angina pectoris 08/21/2022   Coronary artery calcification 11/23/2020   Spondylosis of lumbosacral region without myelopathy or radiculopathy 01/14/2018   GERD (gastroesophageal reflux disease)  10/20/2017   Chronic back pain 03/17/2017   Hypothyroidism    Lumbar radiculopathy 04/24/2015   Rosacea 10/21/2013   Allergic rhinitis 03/17/2007   Peyronie's disease 02/19/2007   PCP: Bennett Reuben MARLA, MD  REFERRING PROVIDER: Bennett Reuben MARLA, MD  REFERRING DIAG: Chronic midline low back pain with bilateral sciatica  Rationale for Evaluation and Treatment: Rehabilitation  THERAPY DIAG:  Sciatica, right side  Other low back pain  Radiculopathy, thoracolumbar region  Sciatica, left side  ONSET DATE: over 10 years ago, insidious onset with worsening symptoms  SUBJECTIVE:  PERTINENT HISTORY:  Patient is a 68 y.o. male who presents to outpatient physical therapy with a referral for medical diagnosis Chronic midline low back pain with bilateral sciatica. This patient's chief complaints consist of chronic low back with occasional numbness and tingling down B LE into the feet, leading to the following functional deficits: bending, sitting, standing and laying. 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 he  has a past medical history of Allergy, GERD (gastroesophageal reflux disease), Hemorrhoid, Hyperlipidemia, Hypothyroidism, and Peyronie disease. 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 Patient denies hx of stroke, seizures, lung problems, diabetes, unexplained weight loss, unexplained changes in bowel or bladder problems, and osteoporosis  SUBJECTIVE STATEMENT: Patient stated he has been more active this weekend with fixing up cars and under a house and pain number got up to a  5-6/10. Patient did not do exercises as he was busy.  PAIN:  NPRS: 2-3/10 starting in low back and going up into the back of the neck. Pain was up 5-6/10 this morning  From initial visit: Are you having pain? Yes Pain location: Normally his L side is worst than the R in terms of numbness and tingling, pain in the low back is present across flank of low back.  Patient also reports his knees has occasionally giving out on him but he believes it is not related. Has intermittent numbness/tingling down back of both legs to bottoms of feet (but not present currently) Pain description: numbness and tingling in feet NPRS: Current: 0/10 but noticed it when he sat down 1/10  Worst: 10/10   Aggravating: sitting, standing, laying  Best: 0/10   Relieving: can be sitting standing and or laying  PRECAUTIONS: None  RED FLAGS: No red flags   WEIGHT BEARING RESTRICTIONS: No  FALLS: Has patient fallen in last 6 months? Yes. 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    Prone lumbar mobilizations grade 2-3 CPA and UPA (L3 - L4), oscillatory 1 bouts of 60 second oscillations with 20 seconds rest between each level Pt noted pain with initial pressing of L3  that relieved with repetitions and into grade 3   Hooklying lumbar traction with belt looped under knees   5 x 10 seconds on/off Pt did not feel any change with traction or with the let off so  it was discontinued  Therapeutic exercise: therapeutic exercises that incorporate ONE parameter at one or more areas of the body to centralize symptoms, develop strength and endurance, range of motion, and flexibility required for successful completion of functional activities.   Standing hip abductions with UE support on TM Improve LE strength  2 x 10 each side with 3# ankle weights   Seated bilateral piriformis stretch to reduce tension   1 x 30 seconds each side Pt noted R side felt tighter than L today  Neuromuscular Re-education: a technique or exercise performed with the goal of improving the level of communication between the body and the brain, such as for balance, motor control, muscle activation patterns, coordination, desensitization, quality of muscle contraction, proprioception, and/or kinesthetic sense needed for successful and safe completion of functional activities.   Hooklying bridge with PPT to improve lumbopelvic control with movement and strengthen glutes   2 x 10  Hooklying PPT with B weighted arm raise to improve lumbo pelvic control   2 x 10 7#DB  Hooklying PPT to improve lumbo pelvic control and maintain anti-extension posturing   2 x 10  Tall kneeling reverse wood chops to improve core strength and balance    2 x 10 each side 10# cable   Standing resisted pallof press, 10#  cable to improve core strength while maintaining lumbar anti-extension posturing  2 x 10  Quadruped hip hikes on mat table with towel roll under contralateral knee to improve coordination and stability in low back  2 x 10 each side   Cued for neutral pelvis position  Quadruped for improved core strength while maintaining anti extension posturing in lumbar spine 1 x 10 each side  Pt cued to remaining a neutral pelvis and not shift his weight  Pt required multimodal cuing for proper technique and to facilitate improved neuromuscular control, strength, range of motion, and functional ability resulting in improved performance and form.  PATIENT EDUCATION:  Education details: Exercise purpose/form. Self management techniques. Education on diagnosis, prognosis, POC, anatomy and physiology of current condition. Education on HEP including handout. Person educated: Patient Education method: Explanation, Demonstration, Tactile cues, Verbal cues, and Handouts Education comprehension: verbalized understanding, returned demonstration, and verbal cues required  HOME EXERCISE PROGRAM: Access Code: [email or web address] URL: [email or web address] Date: 12/31/2023 Prepared by: Vernell Rise  Exercises - Supine Posterior Pelvic Tilt  - 2 x daily - 3 sets - 10 reps - Supine Lower Trunk Rotation  - 2 x daily - 3 sets - 10 reps - Supine Bridge  - 2 x daily - 3 sets - 10 reps - Seated Piriformis Stretch  - 2 x daily - 2 sets - 30 hold - Hip Flexor Stretch with Chair  - 2 x daily - 2 sets - 15 hold - Standard Plank  - 1 x daily - 2 sets - 30 second hold  ASSESSMENT:  CLINICAL IMPRESSION: Patient requires 30 seconds in between change of position to allow the back pain to calm down enough to complete exercises. Patient noted the the hooklying bridge caused some spikes in sharp pain with initial 5 reps and slowly decreased as he kept going. Attempted lumbar traction in hooklying and patient did not feel  any change with traction or let off so the exercise was discontinued. Patient reported feeling muscle fatigue with second set of hip hikes but reported no pain increase. Progressed standing hip abductions to 3# ankle weights with increased fatigue and no increase in pain. Also able to progress  tall kneeling reverse wood chops to 10# cable for an increase in muscle fatigue upon completion of second set. Patient demonstrated decrease pelvis rotation when extending his L leg in quadruped. Patient noted pain with initial pressure of L3 CPA that alleviated with more reps. Completed bilateral UPAs today as well to test for mobility in that direction and help alleviate some pain. Patient noted no pain with UPAs however L3 and L4 were stiff bilaterally and loosened with reps. Patient denied pain upon completion of session and felt muscle soreness in legs and lats more on the R than L. Patient will benefit from skilled physical therapy intervention to address current body structure impairments and activity limitations to improve function and work towards goals set in current POC in order to return to prior level of function or maximal functional improvement.   OBJECTIVE IMPAIRMENTS: decreased ROM, hypomobility, impaired flexibility, pain, and motor control, knowledge of condition, neural tension.   ACTIVITY LIMITATIONS: bending, sitting, standing, and sleeping  PARTICIPATION LIMITATIONS: patient states there a day where he can do anything without pain and others when he can't do anything.  PERSONAL FACTORS: Past/current experiences, Time since onset of injury/illness/exacerbation, 3+ comorbidities: coronary artery calcification, stable angina, GERD and hx of spinal tumor, and level of engagement in PT process are also affecting patient's functional outcome.   REHAB POTENTIAL: Good  CLINICAL DECISION MAKING: Stable/uncomplicated  EVALUATION COMPLEXITY: Low  GOALS: Goals reviewed with patient? No  SHORT TERM  GOALS: Target date: 12/29/2023  Patient will be independent with initial home exercise program for self-management of symptoms. Baseline: Initial HEP provided at IE (12/15/23); Goal status:  in progress  LONG TERM GOALS: Target date: 03/08/2024  Patient will be independent with a long-term home exercise program for self-management of symptoms.  Baseline: Initial HEP provided at IE (12/15/23); Goal status:  in progress  2.  Patient will improve active lumbar ext and bilateral side bending to >75% to improve tension and movement of the spine to reduce aggravating his symptoms. Baseline: Ext/R Side bending/L Side bending: 25% (12/15/23); Goal status:  in progress  3.  Patient will articulate what makes his pain better or worse to demonstrate improve awareness of his condition behavior to improve ability to participate in functional activities with less irritation. Baseline: unable to articulate clearly at this time (12/15/23); Goal status:  in progress  4.  Patient will demonstrate improvement in Patient Specific Functional Scale (PSFS) of equal or greater than 8/10 points to reflect clinically significant improvement in patient's most valued functional activities. Baseline: to be measured at visit 2 as appropriate (12/15/23); 5/10 (12/17/23) Goal status:  in progress  5.  Patient will report NPRS equal or less than 3/10 during functional activities during the last 2 weeks to improve their abilitly to complete community, work and/or recreational activities with less limitation. Baseline: 10/10 (12/15/23); Goal status: in progress  PLAN:  PT FREQUENCY: 2x/week  PT DURATION: 12 weeks  PLANNED INTERVENTIONS: 97164- PT Re-evaluation, 97750- Physical Performance Testing, 97110-Therapeutic exercises, 97530- Therapeutic activity, [id number]- Neuromuscular re-education, 97535- Self Care, 02859- Manual therapy, [id number]- Electrical stimulation (unattended), 20560 (1-2 muscles), 20561 (3+ muscles)- Dry  Needling, Patient/Family education, Balance training, Spinal mobilization, Cryotherapy, and Moist heat.  PLAN FOR NEXT SESSION:  Continue working on hip strength, balance and anti extension posturing. Return to prone lumbar mobilizations. Consider quadruped bird dogs (if tolerated). Update HEP as appropriate. Manual therapy and dry needling as needed.  Vernell Rise, SPT 01/12/2024   Camie SAUNDERS. Juli, PT,  DPT, Cert. MDT, PRA-C 01/12/2024, 10:48 AM  [facility] [address] [place], [place] [address] P: [phone] I F: [phone]

## 2024-01-12 ENCOUNTER — Ambulatory Visit: Admitting: Physical Therapy

## 2024-01-12 DIAGNOSIS — M5459 Other low back pain: Secondary | ICD-10-CM

## 2024-01-12 DIAGNOSIS — M5415 Radiculopathy, thoracolumbar region: Secondary | ICD-10-CM

## 2024-01-12 DIAGNOSIS — M5432 Sciatica, left side: Secondary | ICD-10-CM

## 2024-01-12 DIAGNOSIS — M5431 Sciatica, right side: Secondary | ICD-10-CM | POA: Diagnosis not present

## 2024-01-13 NOTE — Therapy (Deleted)
 OUTPATIENT PHYSICAL THERAPY TREATMENT   Patient Name: ALFONS SULKOWSKI MRN: [id number] DOB:Jul 05, 1955, 68 y.o., male Today's Date: 01/13/2024  END OF SESSION:   Past Medical History:  Diagnosis Date   Allergy    allergic rhinitis   GERD (gastroesophageal reflux disease)    Hemorrhoid    Hyperlipidemia    Hypothyroidism    Peyronie disease    Past Surgical History:  Procedure Laterality Date   BACK SURGERY     COLONOSCOPY     CORONARY STENT INTERVENTION N/A 08/21/2022   Procedure: CORONARY STENT INTERVENTION;  Surgeon: Darron Deatrice LABOR, MD;  Location: [facility];  Service: Cardiovascular;  Laterality: N/A;   HERNIA REPAIR     INGUINAL HERNIA REPAIR  09/2003   LEFT HEART CATH AND CORONARY ANGIOGRAPHY Left 08/21/2022   Procedure: LEFT HEART CATH AND CORONARY ANGIOGRAPHY;  Surgeon: Darron Deatrice LABOR, MD;  Location: [facility];  Service: Cardiovascular;  Laterality: Left;   MOHS SURGERY     nasal SCC----[facility]   SPINE SURGERY     TOENAIL EXCISION     partial   TONSILLECTOMY  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     Patient Active Problem List   Diagnosis Date Noted   Prediabetes 12/11/2023   Atypical pneumonia 02/09/2023   Hyperlipidemia LDL goal <70 08/22/2022   Stable angina 08/21/2022   Abnormal cardiac CT angiography 08/21/2022   Coronary artery disease of native artery of native heart with stable angina pectoris 08/21/2022   Coronary artery calcification 11/23/2020   Spondylosis of lumbosacral region without myelopathy or radiculopathy 01/14/2018   GERD (gastroesophageal reflux disease) 10/20/2017   Chronic back pain 03/17/2017   Hypothyroidism    Lumbar radiculopathy 04/24/2015   Rosacea 10/21/2013   Allergic rhinitis 03/17/2007   Peyronie's disease 02/19/2007   PCP: Bennett Reuben MARLA, MD  REFERRING PROVIDER: Bennett Reuben MARLA, MD  REFERRING DIAG: Chronic midline low back pain with bilateral sciatica  Rationale for Evaluation and Treatment:  Rehabilitation  THERAPY DIAG:  Sciatica, right side  Other low back pain  Radiculopathy, thoracolumbar region  Sciatica, left side  ONSET DATE: over 10 years ago, insidious onset with worsening symptoms  SUBJECTIVE:                                                                                                                                                                                           PERTINENT HISTORY:  Patient is a 68 y.o. male who presents to outpatient physical therapy with a referral for medical diagnosis Chronic midline low back pain with bilateral sciatica. This patient's chief complaints consist of  chronic low back with occasional numbness and tingling down B LE into the feet, leading to the following functional deficits: bending, sitting, standing and laying. 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 he  has a past medical history of Allergy, GERD (gastroesophageal reflux disease), Hemorrhoid, Hyperlipidemia, Hypothyroidism, and Peyronie disease. 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 Patient denies hx of stroke, seizures, lung problems, diabetes, unexplained weight loss, unexplained changes in  bowel or bladder problems, and osteoporosis  SUBJECTIVE STATEMENT: ***  PAIN:  NPRS: ***  From initial visit: Are you having pain? Yes Pain location: Normally his L side is worst than the R in terms of numbness and tingling, pain in the low back is present across flank of low back.  Patient also reports his knees has occasionally giving out on him but he believes it is not related. Has intermittent numbness/tingling down back of both legs to bottoms of feet (but not present currently) Pain description: numbness and tingling in feet NPRS: Current: 0/10 but noticed it when he sat down 1/10  Worst: 10/10   Aggravating: sitting, standing, laying  Best: 0/10   Relieving: can be sitting standing and or laying  PRECAUTIONS: None  RED FLAGS: No red flags   WEIGHT BEARING RESTRICTIONS: No  FALLS: Has patient fallen in last 6 months? Yes. 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    Prone lumbar mobilizations grade 2-3 CPA and UPA (L3 - L4), oscillatory 1 bouts of 60 second oscillations with 20 seconds rest between each level Pt noted pain with initial pressing of L3  that relieved with repetitions and into grade 3   Hooklying lumbar traction with belt looped under knees   5 x 10 seconds on/off Pt did not feel any change with traction or with the let off so it was discontinued  Therapeutic exercise:  therapeutic exercises that incorporate ONE parameter at one or more areas of the body to centralize symptoms, develop strength and endurance, range of motion, and flexibility required for successful completion of functional activities.   Standing hip abductions with UE support on TM Improve LE strength  2 x 10 each side with 3# ankle weights   Seated bilateral piriformis stretch to reduce tension   1 x 30 seconds each side Pt noted R side felt tighter than L today  Neuromuscular Re-education: a technique or exercise performed with the goal of improving the level of communication between the body and the brain, such as for balance, motor control, muscle activation patterns, coordination, desensitization, quality of muscle contraction, proprioception, and/or kinesthetic sense needed for successful and safe completion of functional activities.   Hooklying bridge with PPT to improve lumbopelvic control with movement and strengthen glutes   2 x 10  Hooklying PPT with B weighted arm raise to improve lumbo pelvic control   2 x 10 7#DB  Hooklying PPT to improve lumbo pelvic control and maintain anti-extension posturing   2 x 10  Tall kneeling reverse wood chops to improve core strength and balance    2 x 10 each side 10# cable   Standing resisted pallof press, 10# cable to improve core strength while maintaining lumbar anti-extension posturing  2 x 10  Quadruped hip hikes on mat table with towel roll under contralateral knee to improve coordination and stability in low back  2 x 10 each side   Cued for neutral pelvis position  Quadruped for improved core strength while maintaining anti extension posturing in lumbar spine 1 x 10 each side  Pt cued to remaining a neutral pelvis and not shift his weight  Pt required multimodal cuing for proper technique and to facilitate improved neuromuscular control, strength, range of motion, and functional ability resulting in improved performance and  form.  PATIENT EDUCATION:  Education details: Exercise purpose/form. Self management techniques. Education on diagnosis, prognosis, POC, anatomy and physiology of current condition. Education on HEP including handout. Person educated: Patient Education method: Explanation, Demonstration, Tactile cues, Verbal cues, and Handouts Education comprehension: verbalized understanding, returned demonstration, and verbal cues required  HOME EXERCISE PROGRAM: Access Code: [email or web address] URL: [email or web address] Date: 12/31/2023 Prepared by: Vernell Rise  Exercises - Supine Posterior Pelvic Tilt  - 2 x daily - 3 sets - 10 reps - Supine Lower Trunk Rotation  - 2 x daily - 3 sets - 10 reps - Supine Bridge  - 2 x daily - 3 sets - 10 reps - Seated Piriformis Stretch  - 2 x daily - 2 sets - 30 hold - Hip Flexor Stretch with Chair  - 2 x daily - 2 sets - 15 hold - Standard Plank  - 1 x daily - 2 sets - 30 second hold  ASSESSMENT:  CLINICAL IMPRESSION: ***   Patient will benefit from skilled physical therapy intervention to address current body structure impairments and activity limitations to improve function and work towards goals set in current POC in order to return to prior level of function or maximal functional improvement.   OBJECTIVE IMPAIRMENTS: decreased ROM, hypomobility, impaired flexibility, pain, and motor control, knowledge of condition, neural tension.   ACTIVITY LIMITATIONS: bending, sitting, standing, and sleeping  PARTICIPATION LIMITATIONS: patient states there a day where he can do anything without pain and others when he can't do anything.  PERSONAL FACTORS: Past/current experiences, Time since onset of injury/illness/exacerbation, 3+ comorbidities: coronary artery calcification, stable angina, GERD and hx of spinal tumor, and level of engagement in PT process are also affecting patient's functional outcome.   REHAB POTENTIAL: Good  CLINICAL DECISION MAKING:  Stable/uncomplicated  EVALUATION COMPLEXITY: Low  GOALS: Goals reviewed with patient? No  SHORT TERM GOALS: Target date: 12/29/2023  Patient will be independent with initial home exercise program for self-management of symptoms. Baseline: Initial HEP provided at IE (12/15/23); Goal status:  in progress  LONG TERM GOALS: Target date: 03/08/2024  Patient will be independent with a long-term home exercise program for self-management of symptoms.  Baseline: Initial HEP provided at IE (12/15/23); Goal status:  in progress  2.  Patient will improve active lumbar ext and bilateral side bending to >75% to improve tension and movement of the spine to reduce aggravating his symptoms. Baseline: Ext/R Side bending/L Side bending: 25% (12/15/23); Goal status:  in progress  3.  Patient will articulate what makes his pain better or worse to demonstrate improve awareness of his condition behavior to improve ability to participate in functional activities with less irritation. Baseline: unable to articulate clearly at this time (12/15/23); Goal status:  in progress  4.  Patient will demonstrate improvement in Patient Specific Functional Scale (PSFS) of equal or greater than 8/10 points to reflect clinically significant improvement in patient's most valued functional activities. Baseline: to be measured at visit 2 as appropriate (12/15/23); 5/10 (12/17/23) Goal status:  in progress  5.  Patient will report NPRS equal or less than 3/10 during functional activities during the last 2 weeks to improve their abilitly to complete community, work and/or recreational activities with less limitation. Baseline: 10/10 (12/15/23); Goal status: in progress  PLAN:  PT FREQUENCY: 2x/week  PT DURATION: 12 weeks  PLANNED INTERVENTIONS: 97164- PT Re-evaluation, 97750- Physical Performance Testing, 97110-Therapeutic exercises, 97530- Therapeutic activity, [id number]- Neuromuscular re-education, 97535- Self Care, [id number]-  Manual therapy, G0283- Electrical stimulation (unattended), 20560 (1-2 muscles), 20561 (3+ muscles)- Dry Needling, Patient/Family education, Balance training, Spinal mobilization, Cryotherapy, and Moist heat.  PLAN FOR NEXT SESSION:  *** Continue working on hip strength, balance and anti extension posturing. Return to prone lumbar mobilizations. Consider quadruped bird dogs (if tolerated). Update HEP as appropriate. Manual therapy and dry needling as needed.  Vernell Rise, SPT 01/13/2024

## 2024-01-14 ENCOUNTER — Ambulatory Visit: Admitting: Physical Therapy

## 2024-01-15 ENCOUNTER — Ambulatory Visit (INDEPENDENT_AMBULATORY_CARE_PROVIDER_SITE_OTHER)

## 2024-01-15 VITALS — BP 124/70 | HR 69 | Temp 98.7°F | Ht 67.0 in | Wt 175.0 lb

## 2024-01-15 DIAGNOSIS — N529 Male erectile dysfunction, unspecified: Secondary | ICD-10-CM | POA: Diagnosis not present

## 2024-01-15 DIAGNOSIS — R7303 Prediabetes: Secondary | ICD-10-CM | POA: Diagnosis not present

## 2024-01-15 DIAGNOSIS — K219 Gastro-esophageal reflux disease without esophagitis: Secondary | ICD-10-CM

## 2024-01-15 DIAGNOSIS — Z0000 Encounter for general adult medical examination without abnormal findings: Secondary | ICD-10-CM

## 2024-01-15 MED ORDER — OMEPRAZOLE 20 MG PO CPDR: 20.0000 mg | DELAYED_RELEASE_CAPSULE | Freq: Every day | ORAL | 0 refills | Status: AC

## 2024-01-15 MED ORDER — TADALAFIL 20 MG PO TABS: 10.0000 mg | ORAL_TABLET | ORAL | 0 refills | Status: AC | PRN

## 2024-01-15 NOTE — Patient Instructions (Signed)
 Thank you for visiting [facility] today! Here's what we talked about: - START Omeprazole , STOP Protonix  - Reduce soda and sugary food intake

## 2024-01-15 NOTE — Progress Notes (Unsigned)
 "  Assessment & Plan:   This visit was conducted in person. The patient gave informed consent to the use of [employer] AI technology to record the contents of the encounter as documented below.  Assessment and Plan Assessment & Plan Gastroesophageal reflux disease with dysphagia GERD symptoms flared, requiring frequent Protonix . Dysphagia with food impaction sensation and nausea noted. No weight loss or red flags. Symptoms likely GERD-related. - Discontinued Protonix . - Started omeprazole  daily for 8 weeks, then as needed. - Advised dietary modifications to avoid triggers: chocolate, iced tea, fried foods. - Consider swallow study if symptoms persist after 8 weeks.  Prediabetes A1c indicates prediabetes. No weight loss or red flags. - Repeat A1c in 6 months. - Advised reducing sugary foods and drinks, including soda. - Encouraged moderation in sugary consumption.  Hypothyroidism Taking levothyroxine  on an empty stomach. Thyroid  function to be monitored. - Continue levothyroxine  as prescribed. - Monitor thyroid  function with upcoming labs.  Chronic low back pain with bilateral sciatica Limited physical therapy due to recent surgery. - Continue physical therapy for 8 weeks. - Scheduled follow-up for back pain assessment post-therapy.  Coronary artery disease with coronary stent Previously on Protonix  due to blood thinner interaction, now off blood thinners.  Varicose veins of lower extremity Mild left lower extremity swelling, decreases with elevation. - Advised leg elevation when seated or reclining. - Recommended compression stockings if swelling persists.  Male erectile dysfunction Continues Cialis  use as needed. - Refilled Cialis  prescription.  General Health Maintenance Routine health maintenance discussed. Declined flu shot and COVID booster. Up to date on other vaccines. HIV screening deferred. - Encouraged healthy diet and lifestyle modifications.   I have personally  reviewed and have noted: 1. The patient's medical and social history 2. Their use of alcohol, tobacco or illicit drugs 3. Their current medications and supplements 4. The patient's functional ability including ADL's, fall risks, home safety risks and hearing or visual impairment. 5. Diet and physical activities 6. Evidence for depression or mood disorder   Colonoscopy: Colo 2024 showed polyp, not precancerous, repeat in 10 years recommended Labs: PSA, deferred HIV, Hep C, Lipid and A1c UTD Immunizations: Declines flu, covid Diet and Exercise: Reduce sugary drinks and foods given prediabetes Sleep and mood: No concerns Dental and vision:UTD ADLs and IADLs: Capable Cognitive: Intact to orientation, naming, recall and repetition Fall risk and home safety: No concerns Health counselling given: See diet advice above   Advanced Directives Patient does not have advanced directives would like to think about it. See social hx.   Follow-up: No follow-ups on file.        Subjective:   Patient ID: Willie Griffin, male    DOB: [birth year]  Age: 68 y.o. MRN: [id number]  Patient Care Team: Bennett Reuben MARLA, MD as PCP - General (Family Medicine) Darliss Rogue, MD as PCP - Cardiology (Cardiology)   CC: No chief complaint on file.   Willie Griffin is a 68 y.o. male here for annual physical and f/u on chronic conditions, see assessment and plan for further details Acute complaint of *** also addressed, see assessment and plan for further details  Discussed the use of AI scribe software for clinical note transcription with the patient, who gave verbal consent to proceed.  History of Present Illness Willie Griffin is a 68 year old male who presents for an annual physical exam and review of lab results.  He had a colonoscopy in 2024 which revealed a non-precancerous polyp.  He has  a history of high cholesterol and is currently taking Lipitor 10 mg daily after experiencing muscle aches  on a higher dose. His cholesterol levels were discussed during the visit.  He is prediabetic with a recent A1c of 6.0.  He had a high B12 level and has since stopped taking B12 supplements completely.  He is on thyroid  medication and has adjusted his routine to take it on an empty stomach by waking up earlier, approximately 50-55 minutes before leaving the house and before taking other medications.  He experiences symptoms of acid reflux and takes Protonix  as needed, approximately three to four times a week. He previously took it daily and noted better symptom control then. He was switched from omeprazole  to Protonix  due to interactions with blood thinners, which he is no longer taking.  He reports occasional difficulty swallowing food in the morning, requiring liquids to help food pass. This occurs three to four times a week and seems related to his reflux symptoms.  He has a history of pneumonia and received a pneumonia vaccine after a severe episode, which involved multiple doctor visits and treatments with antibiotics and steroids.  He uses a CPAP machine nightly for sleep apnea and reports no issues with sleep.  He has a history of varicose veins in his left leg, which swells during the day and reduces at night. He elevates his leg at night to manage the swelling.  He is currently undergoing physical therapy for back pain but reports it is not helping. He had to cancel two sessions recently due to feeling drained after a surgery.  He has a history of eye issues requiring check-ups every six months and dental cleanings twice a year.     Pregnancy Intention Screening:  The pregnancy intention screening data noted above was reviewed. Potential methods of contraception were discussed. The patient elected to proceed with No data recorded.  Depression Screening;    12/10/2023    9:10 AM 12/09/2022    9:06 AM 12/09/2022    8:44 AM 09/15/2022    5:29 PM 12/03/2021    8:37 AM 11/23/2020     8:39 AM 11/23/2019    7:39 AM  PHQ 2/9 Scores  PHQ - 2 Score 0 0 0 0 0 0 0  PHQ- 9 Score    0         Data saved with a previous flowsheet row definition     Anxiety Screening:     No data to display           ROS: Negative unless specifically indicated above in HPI.       Objective:     There were no vitals taken for this visit.   Physical Exam GENERAL: Alert, cooperative, well developed, no acute distress. HEAD: Normocephalic atraumatic. EYES: Extraocular movements intact BL, pupils round, equal and reactive to light BL, conjunctivae normal BL. EARS: Tympanic membrane, ear canal and external ear normal BL. NOSE: No congestion or rhinorrhea, mucous membranes are moist. THROAT: No oropharyngeal exudate or posterior oropharyngeal erythema. CARDIOVASCULAR: Normal heart rate and rhythm, S1 and S2 normal without murmurs. CHEST: Clear to auscultation bilaterally, no wheezes, rhonchi, or crackles. ABDOMEN: Soft, non tender, non distended, without organomegaly, normal bowel sounds. EXTREMITIES: No cyanosis or edema. Varicosities present on left lower extremity. NEUROLOGICAL: Oriented to person, place and time, no gait abnormalities, moves all extremities without gross motor or sensory deficit. NECK: Neck normal. Thyroid  normal.        Samyra Limb K Eloyce Bultman,  MD  01/15/2024    Contains text generated by [employer].    "

## 2024-01-19 ENCOUNTER — Ambulatory Visit: Admitting: Physical Therapy

## 2024-01-19 ENCOUNTER — Encounter: Payer: Self-pay | Admitting: Physical Therapy

## 2024-01-19 DIAGNOSIS — M5415 Radiculopathy, thoracolumbar region: Secondary | ICD-10-CM

## 2024-01-19 DIAGNOSIS — M5432 Sciatica, left side: Secondary | ICD-10-CM

## 2024-01-19 DIAGNOSIS — M5459 Other low back pain: Secondary | ICD-10-CM

## 2024-01-19 DIAGNOSIS — M5431 Sciatica, right side: Secondary | ICD-10-CM | POA: Diagnosis not present

## 2024-01-19 NOTE — Therapy (Signed)
 " OUTPATIENT PHYSICAL THERAPY TREATMENT   Patient Name: Willie Griffin MRN: [id number] DOB:06-Oct-1955, 68 y.o., male Today's Date: 01/19/2024  END OF SESSION:  PT End of Session - 01/19/24 1642     Visit Number 7    Number of Visits 25    Date for Recertification  03/08/24    Authorization Type MEDICARE PART B reporting period from 12/15/2023    Progress Note Due on Visit 10    PT Start Time 1037    PT Stop Time 1116    PT Time Calculation (min) 39 min    Activity Tolerance Patient tolerated treatment well    Behavior During Therapy WFL for tasks assessed/performed          Past Medical History:  Diagnosis Date   Allergy    allergic rhinitis   GERD (gastroesophageal reflux disease)    Hemorrhoid    Hyperlipidemia    Hypothyroidism    Peyronie disease    Past Surgical History:  Procedure Laterality Date   BACK SURGERY     COLONOSCOPY     CORONARY STENT INTERVENTION N/A 08/21/2022   Procedure: CORONARY STENT INTERVENTION;  Surgeon: Darron Deatrice LABOR, MD;  Location: [facility];  Service: Cardiovascular;  Laterality: N/A;   HERNIA REPAIR     INGUINAL HERNIA REPAIR  09/2003   LEFT HEART CATH AND CORONARY ANGIOGRAPHY Left 08/21/2022   Procedure: LEFT HEART CATH AND CORONARY ANGIOGRAPHY;  Surgeon: Darron Deatrice LABOR, MD;  Location: [facility];  Service: Cardiovascular;  Laterality: Left;   MOHS SURGERY     nasal SCC----[facility]   SPINE SURGERY     TOENAIL EXCISION     partial   TONSILLECTOMY  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     Patient Active Problem List   Diagnosis Date Noted   Prediabetes 12/11/2023   Atypical pneumonia 02/09/2023   Hyperlipidemia LDL goal <70 08/22/2022   Stable angina 08/21/2022   Abnormal cardiac CT angiography 08/21/2022   Coronary artery disease of native artery of native heart with stable angina pectoris 08/21/2022   Coronary artery calcification 11/23/2020   Spondylosis of lumbosacral region without myelopathy or  radiculopathy 01/14/2018   GERD (gastroesophageal reflux disease) 10/20/2017   Chronic back pain 03/17/2017   Hypothyroidism    Lumbar radiculopathy 04/24/2015   Rosacea 10/21/2013   Allergic rhinitis 03/17/2007   Peyronie's disease 02/19/2007   PCP: Bennett Reuben MARLA, MD  REFERRING PROVIDER: Bennett Reuben MARLA, MD  REFERRING DIAG: Chronic midline low back pain with bilateral sciatica  Rationale for Evaluation and Treatment: Rehabilitation  THERAPY DIAG:  Sciatica, right side  Other low back pain  Radiculopathy, thoracolumbar region  Sciatica, left side  ONSET DATE: over 10 years ago, insidious onset with worsening symptoms  SUBJECTIVE:  PERTINENT HISTORY:  Patient is a 68 y.o. male who presents to outpatient physical therapy with a referral for medical diagnosis Chronic midline low back pain with bilateral sciatica. This patient's chief complaints consist of chronic low back with occasional numbness and tingling down B LE into the feet, leading to the following functional deficits: bending, sitting, standing and laying. 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 he  has a past medical history of Allergy, GERD (gastroesophageal reflux disease), Hemorrhoid, Hyperlipidemia, Hypothyroidism, and Peyronie disease, S2  right-sided schwannoma. he  has a past surgical history that includes Inguinal hernia repair (09/2003); Tonsillectomy (1972); toenail excision; Colonoscopy; 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 (WHO grade 1 schwannoma on 08/20/2016); Mohs surgery; LEFT HEART CATH AND CORONARY ANGIOGRAPHY (Left, 08/21/2022); CORONARY STENT INTERVENTION (N/A, 08/21/2022); Lumbar laminectomy T12-L1 (08/20/2016); Back surgery; Hernia repair; and Spine surgery. Patient denies hx of stroke, seizures, lung problems, diabetes, unexplained weight loss, unexplained changes in bowel or bladder problems, and osteoporosis   SUBJECTIVE STATEMENT: Patient states things are okay today. He only got under the car once this week. He did not do any HEP since last PT session. He as been in a lot of pain this week. He feels that nothing is different this week than usual. It hurts more when he moves. He cannot remember what he felt after last PT session. He had a visit with his PCP and she said something about having PT through the end of January.   Top three things giving his problems: after any physical activity like lifting or carrying something. Any activity that would involve bending and twisting.   PAIN:  NPRS: 0-1/10 in across low back and near the right scapula.   From initial visit: Are you having pain? Yes Pain location: Normally his L side is worst than the R in terms of numbness and tingling, pain in the low back is present across flank of low back.  Patient also reports his knees has occasionally giving out on him but he believes it is not related. Has intermittent numbness/tingling down back of both legs to bottoms of feet (but not present currently) Pain description: numbness and tingling in feet NPRS: Current: 0/10 but noticed it when he sat down 1/10  Worst: 10/10   Aggravating: sitting, standing, laying  Best: 0/10   Relieving: can be sitting standing and or laying  PRECAUTIONS: None  RED FLAGS: No red flags   WEIGHT  BEARING RESTRICTIONS: No  FALLS: Has patient fallen in last 6 months? Yes. 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 Follow-up.   Creatinine was obtained on site at [facility] at [address]   Results: Creatinine 1.2 mg/dL.   EXAM: MRI LUMBAR SPINE WITHOUT AND WITH CONTRAST   TECHNIQUE: Multiplanar and multiecho pulse sequences of the lumbar spine were obtained without and with intravenous contrast.   CONTRAST:  17mL MULTIHANCE  GADOBENATE DIMEGLUMINE  529 MG/ML IV SOLN   COMPARISON:  12/17/2016   FINDINGS: Segmentation:  Standard.   Alignment:  Unchanged trace retrolisthesis of L3 on L4 and L4 on L5.   Vertebrae: No fracture or suspicious osseous lesion. Persistent mild to moderate type 1 degenerative endplate changes at L4-5. 14 mm heterogeneously T2 hyperintense mass closely associated with the right S2 nerve root, unchanged in size from a noncontrast 03/01/2015 lumbar MRI and demonstrating heterogeneous enhancement on today's study (only imaged in the sagittal plane).   Conus medullaris and cauda equina: Conus extends to the upper L1 level. No recurrent mass is identified about the conus.   Paraspinal and other soft tissues: Postoperative changes in the posterior soft tissues at T12-L1 with interval resolution of the small fluid collection on the prior study.   Disc levels:   Disc desiccation from L2-3 to L4-5 with mild disc space narrowing  at L4-5.   T11-12: Negative.   T12-L1: Prior laminectomies.  No disc herniation or stenosis.   L1-2: Negative.   L2-3: Mild disc bulging with small central annular fissure, unchanged. No stenosis.   L3-4: Mild circumferential disc bulging and a small left foraminal to extraforaminal disc protrusion with annular fissure result in borderline to mild lateral recess and neural foraminal stenosis bilaterally without significant spinal stenosis, unchanged.   L4-5: Mild circumferential disc bulging, small left foraminal disc extrusion, and mild facet hypertrophy result in mild right and moderate left neural foraminal stenosis and borderline to mild bilateral lateral recess stenosis without significant spinal stenosis, unchanged.   L5-S1: Negative.   IMPRESSION: 1. Postoperative changes without recurrent mass at the conus. 2. 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 3. Unchanged lumbar disc and facet degeneration as above.  NEUROLOGIC TESTING Lumbar Neural Tension Testing Straight Leg Raise  From supine (pt unable to tolerate due to posterior L knee pain in end range extension).  With pre-flexed hip/knee R = pain at back of lateral knee near end range L = pain at back of lateral knee near end range Equal bilaterally  TREATMENT:       Manual therapy: to reduce pain and tissue tension, improve range of motion, neuromodulation, in order to promote improved ability to complete functional activities.    Hooklying lumbar traction with belt looped under knees   4 x 10 seconds on/off No significant change in low back or thoracic spine (discontinued)  Therapeutic exercise: therapeutic exercises that incorporate ONE parameter at one or more areas of the body to centralize symptoms, develop strength and endurance, range of motion, and flexibility required for successful completion of functional  activities.  LE neurodynamic testing to assess for nerve tension sensitivity to help guide interventions (see above)  Feet elevated self-mobilization at thoracic spine with Yoga tune up peanut 1x5 breaths each level from approx T8 to top of thoracic spine.  Discussed use at home. Pt reports he is missing part of his spine from surgery to the thoracic spine but cannot remember level. PT cannot find it in chart review during session (later found he had a laminectomy at T12-L1, which is below the target region). Deferred adding to HEP  until surgery type and level could be verified.   Sidelying knees to chest thoracic rotation with cervical spine rotation with hand on chest, actively retracting scapula for active stretch of thoracic spine.  1x10 each side rotating with exhale   Elbow by side to get best stretch in upper back Added to HEP    Pt required multimodal cuing for proper technique and to facilitate improved neuromuscular control, strength, range of motion, and functional ability resulting in improved performance and form.  PATIENT EDUCATION:  Education details: Exercise purpose/form. Self management techniques. Education on diagnosis, prognosis, POC, anatomy and physiology of current condition. Education on HEP including handout. Person educated: Patient Education method: Explanation, Demonstration, Tactile cues, Verbal cues, and Handouts Education comprehension: verbalized understanding, returned demonstration, and verbal cues required  HOME EXERCISE PROGRAM: Access Code: [email or web address] URL: [email or web address] Date: 01/19/2024 Prepared by: Camie Cleverly  Exercises - Supine Posterior Pelvic Tilt  - 2 x daily - 3 sets - 10 reps - Supine Lower Trunk Rotation  - 2 x daily - 3 sets - 10 reps - Supine Bridge  - 2 x daily - 3 sets - 10 reps - Seated Piriformis Stretch  - 2 x daily - 2 sets - 30 hold - Hip Flexor Stretch with Chair  - 2 x daily - 2 sets - 15 hold - Standard  Plank  - 1 x daily - 2 sets - 30 second hold - Sidelying Open Book  - 1-2 x daily - 2 sets - 10 reps - 1 breath hold  ASSESSMENT:  CLINICAL IMPRESSION: Load and nerve tension mechanical sensitivities re-assessed today. Nerve tension in SLR with hips pre-flexed is equal and appears at end range, which is normal for an adult. Traction alleviation test also negative. Decided to target thoracic stiffness, since pt is globally stiff and he is reporting thoracic pain today. He had increased soreness over the the thoracic spine following self-mobs with the peanut and thoracic rotation exercise. He was hesitant to accept a peanut to perform the mobilization at home. PT was unable to confirm what surgery he had in the thoracic spine after he reported they removed his spinous processes in the thoracic spine. Further chart review after he left uncovered a laminectomy at T12-L1 which is well below the levels targeted for mobilization during today's session. So far, pt does not feel PT has made any changes to his function or condition. He appears to be sensitive to extension and have a lot of stiffness. His imaging was reviewed again and he does have evidence of a R - sided schwannoma at S2 shown on his past imaging (last MRI was 5 years ago) along with other expected changes at the lumbar spine. He may require further medical follow up if no improvements from PT when his goals are re-assessed at visit 10. He has not been reliably completing his HEP and continues to report pain with movement. Patient would benefit from continued management of limiting condition by skilled physical therapist to address remaining impairments and functional limitations to work towards stated goals and return to PLOF or maximal functional independence.   Mechanical sensitivities: extension? Not much with load, not much with nerve tension  OBJECTIVE IMPAIRMENTS: decreased ROM, hypomobility, impaired flexibility, pain, and motor control,  knowledge of condition, neural tension.   ACTIVITY LIMITATIONS: bending, sitting, standing, and sleeping  PARTICIPATION LIMITATIONS: patient states there a day where he can do anything without pain and others when he can't do anything.  PERSONAL FACTORS: Past/current  experiences, Time since onset of injury/illness/exacerbation, 3+ comorbidities: coronary artery calcification, stable angina, GERD and hx of spinal tumor, and level of engagement in PT process are also affecting patient's functional outcome.   REHAB POTENTIAL: Good  CLINICAL DECISION MAKING: Stable/uncomplicated  EVALUATION COMPLEXITY: Low  GOALS: Goals reviewed with patient? No  SHORT TERM GOALS: Target date: 12/29/2023  Patient will be independent with initial home exercise program for self-management of symptoms. Baseline: Initial HEP provided at IE (12/15/23); Goal status:  in progress  LONG TERM GOALS: Target date: 03/08/2024  Patient will be independent with a long-term home exercise program for self-management of symptoms.  Baseline: Initial HEP provided at IE (12/15/23); Goal status:  in progress  2.  Patient will improve active lumbar ext and bilateral side bending to >75% to improve tension and movement of the spine to reduce aggravating his symptoms. Baseline: Ext/R Side bending/L Side bending: 25% (12/15/23); Goal status:  in progress  3.  Patient will articulate what makes his pain better or worse to demonstrate improve awareness of his condition behavior to improve ability to participate in functional activities with less irritation. Baseline: unable to articulate clearly at this time (12/15/23); Goal status:  in progress  4.  Patient will demonstrate improvement in Patient Specific Functional Scale (PSFS) of equal or greater than 8/10 points to reflect clinically significant improvement in patient's most valued functional activities. Baseline: to be measured at visit 2 as appropriate (12/15/23); 5/10  (12/17/23) Goal status:  in progress  5.  Patient will report NPRS equal or less than 3/10 during functional activities during the last 2 weeks to improve their abilitly to complete community, work and/or recreational activities with less limitation. Baseline: 10/10 (12/15/23); Goal status: in progress  PLAN:  PT FREQUENCY: 2x/week  PT DURATION: 12 weeks  PLANNED INTERVENTIONS: 97164- PT Re-evaluation, 97750- Physical Performance Testing, 97110-Therapeutic exercises, 97530- Therapeutic activity, [id number]- Neuromuscular re-education, 97535- Self Care, 02859- Manual therapy, [id number]- Electrical stimulation (unattended), 20560 (1-2 muscles), 20561 (3+ muscles)- Dry Needling, Patient/Family education, Balance training, Spinal mobilization, Cryotherapy, and Moist heat.  PLAN FOR NEXT SESSION:  Revisit thoracic mobilization and provide LAX peanut for home use. Continue anti extension training, consider multifidi assessment/training. Return to prone lumbar mobilizations as needed for symptomatic relief. Consider quadruped bird dogs (if tolerated). Update HEP as appropriate. Manual therapy and dry needling as needed.  Camie SAUNDERS. Juli, PT, DPT, Cert. MDT, PRA-C 01/19/2024, 4:43 PM  [facility] [address] [place], [place] [address] P: [phone] I F: [phone]   "

## 2024-01-26 ENCOUNTER — Ambulatory Visit: Admitting: Physical Therapy

## 2024-02-01 ENCOUNTER — Ambulatory Visit

## 2024-02-01 NOTE — Therapy (Incomplete)
 " OUTPATIENT PHYSICAL THERAPY TREATMENT   Patient Name: Willie Griffin MRN: [id number] DOB:Jan 16, 1956, 69 y.o., male Today's Date: 02/01/2024  END OF SESSION:    Past Medical History:  Diagnosis Date   Allergy    allergic rhinitis   GERD (gastroesophageal reflux disease)    Hemorrhoid    Hyperlipidemia    Hypothyroidism    Peyronie disease    Past Surgical History:  Procedure Laterality Date   BACK SURGERY     COLONOSCOPY     CORONARY STENT INTERVENTION N/A 08/21/2022   Procedure: CORONARY STENT INTERVENTION;  Surgeon: Darron Deatrice LABOR, MD;  Location: [facility];  Service: Cardiovascular;  Laterality: N/A;   HERNIA REPAIR     INGUINAL HERNIA REPAIR  09/2003   LEFT HEART CATH AND CORONARY ANGIOGRAPHY Left 08/21/2022   Procedure: LEFT HEART CATH AND CORONARY ANGIOGRAPHY;  Surgeon: Darron Deatrice LABOR, MD;  Location: [facility];  Service: Cardiovascular;  Laterality: Left;   MOHS SURGERY     nasal SCC----[facility]   SPINE SURGERY     TOENAIL EXCISION     partial   TONSILLECTOMY  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     Patient Active Problem List   Diagnosis Date Noted   Prediabetes 12/11/2023   Atypical pneumonia 02/09/2023   Hyperlipidemia LDL goal <70 08/22/2022   Stable angina 08/21/2022   Abnormal cardiac CT angiography 08/21/2022   Coronary artery disease of native artery of native heart with stable angina pectoris 08/21/2022   Coronary artery calcification 11/23/2020   Spondylosis of lumbosacral region without myelopathy or radiculopathy 01/14/2018   GERD (gastroesophageal reflux disease) 10/20/2017   Chronic back pain 03/17/2017   Hypothyroidism    Lumbar radiculopathy 04/24/2015   Rosacea 10/21/2013   Allergic rhinitis 03/17/2007   Peyronie's disease 02/19/2007   PCP: Bennett Reuben MARLA, MD  REFERRING PROVIDER: Bennett Reuben MARLA, MD  REFERRING DIAG: Chronic midline low back pain with bilateral sciatica  Rationale for Evaluation and Treatment:  Rehabilitation  THERAPY DIAG:  No diagnosis found.  ONSET DATE: over 10 years ago, insidious onset with worsening symptoms  SUBJECTIVE:                                                                                                                                                                                            PERTINENT HISTORY:  Patient is a 69 y.o. male who presents to outpatient physical therapy with a referral for medical diagnosis Chronic midline low back pain with bilateral sciatica. This patient's chief complaints consist of chronic low back with occasional numbness and tingling down B  LE into the feet, leading to the following functional deficits: bending, sitting, standing and laying. 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 he  has a past medical history of Allergy, GERD (gastroesophageal reflux disease), Hemorrhoid, Hyperlipidemia, Hypothyroidism, and Peyronie disease, S2 right-sided schwannoma. he  has a past surgical history that includes Inguinal hernia repair (09/2003); Tonsillectomy (1972); toenail excision; Colonoscopy; 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 (WHO grade 1 schwannoma on 08/20/2016); Mohs surgery; LEFT HEART CATH AND CORONARY ANGIOGRAPHY (Left, 08/21/2022); CORONARY STENT INTERVENTION (N/A, 08/21/2022); Lumbar laminectomy T12-L1 (08/20/2016); Back surgery; Hernia repair; and Spine surgery. Patient denies hx of stroke, seizures, lung problems, diabetes, unexplained weight loss,  unexplained changes in bowel or bladder problems, and osteoporosis   SUBJECTIVE STATEMENT:  ***      PAIN:  NPRS: 0-1/10 in across low back and near the right scapula.   From initial visit: Are you having pain? Yes Pain location: Normally his L side is worst than the R in terms of numbness and tingling, pain in the low back is present across flank of low back.  Patient also reports his knees has occasionally giving out on him but he believes it is not related. Has intermittent numbness/tingling down back of both legs to bottoms of feet (but not present currently) Pain description: numbness and tingling in feet NPRS: Current: 0/10 but noticed it when he sat down 1/10  Worst: 10/10   Aggravating: sitting, standing, laying  Best: 0/10   Relieving: can be sitting standing and or laying  PRECAUTIONS: None  RED FLAGS: No red flags   WEIGHT BEARING RESTRICTIONS: No  FALLS: Has patient fallen in last 6 months? Yes. 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 Follow-up.   Creatinine was obtained on site at [facility] at [address]   Results: Creatinine 1.2 mg/dL.   EXAM: MRI LUMBAR SPINE WITHOUT AND WITH CONTRAST   TECHNIQUE: Multiplanar and multiecho pulse sequences of the lumbar spine were obtained without and with intravenous contrast.   CONTRAST:  17mL MULTIHANCE  GADOBENATE DIMEGLUMINE  529 MG/ML IV SOLN   COMPARISON:  12/17/2016    FINDINGS: Segmentation:  Standard.   Alignment:  Unchanged trace retrolisthesis of L3 on L4 and L4 on L5.   Vertebrae: No fracture or suspicious osseous lesion. Persistent mild to moderate type 1 degenerative endplate changes at L4-5. 14 mm heterogeneously T2 hyperintense mass closely associated with the right S2 nerve root, unchanged in size from a noncontrast 03/01/2015 lumbar MRI and demonstrating heterogeneous enhancement on today's study (only imaged in the sagittal plane).   Conus medullaris and cauda equina: Conus extends to the upper L1  level. No recurrent mass is identified about the conus.   Paraspinal and other soft tissues: Postoperative changes in the posterior soft tissues at T12-L1 with interval resolution of the small fluid collection on the prior study.   Disc levels:   Disc desiccation from L2-3 to L4-5 with mild disc space narrowing at L4-5.   T11-12: Negative.   T12-L1: Prior laminectomies.  No disc herniation or stenosis.   L1-2: Negative.   L2-3: Mild disc bulging with small central annular fissure, unchanged. No stenosis.   L3-4: Mild circumferential disc bulging and a small left foraminal to extraforaminal disc protrusion with annular fissure result in borderline to mild lateral recess and neural foraminal stenosis bilaterally without significant spinal stenosis, unchanged.   L4-5: Mild circumferential disc bulging, small left foraminal disc extrusion, and mild facet hypertrophy result in mild right and moderate left neural foraminal stenosis and borderline to mild bilateral lateral recess stenosis without significant spinal stenosis, unchanged.   L5-S1: Negative.   IMPRESSION: 1. Postoperative changes without recurrent mass at the conus. 2. 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 3. Unchanged lumbar disc and facet degeneration as above.  NEUROLOGIC  TESTING Lumbar Neural Tension Testing Straight Leg Raise  From supine (pt unable to tolerate due to posterior L knee pain in end range extension).  With pre-flexed hip/knee R = pain at back of lateral knee near end range L = pain at back of lateral knee near end range Equal bilaterally  TREATMENT:      *** Manual therapy: to reduce pain and tissue tension, improve range of motion, neuromodulation, in order to promote improved ability to complete functional activities.    Hooklying lumbar traction with belt looped under knees   4 x 10 seconds on/off No significant change in low back or thoracic spine (discontinued)  Therapeutic exercise: therapeutic exercises that incorporate ONE parameter at one or more areas of the body to centralize symptoms, develop strength and endurance, range of motion, and flexibility required for successful completion of functional activities.  LE neurodynamic testing to assess for nerve tension sensitivity to help guide interventions (see above)  Feet elevated self-mobilization at thoracic spine with Yoga tune up peanut 1x5 breaths each level from approx T8 to top of thoracic spine.  Discussed use at home. Pt reports he is missing part of his spine from surgery to the thoracic spine but cannot remember level. PT cannot find it in chart review during session (later found he had a laminectomy at T12-L1, which is below the target region). Deferred adding to HEP until surgery type and level could be verified.   Sidelying knees to chest thoracic rotation with cervical spine rotation with hand on chest, actively retracting scapula for active stretch of thoracic spine.  1x10 each side rotating with exhale   Elbow by side to get best stretch in upper back Added to HEP    Pt required multimodal cuing for proper technique and to facilitate improved neuromuscular control, strength, range of motion, and functional ability resulting in improved performance and  form.  PATIENT EDUCATION:  Education details: Exercise purpose/form. Self management techniques. Education on diagnosis, prognosis, POC, anatomy and physiology of current condition. Education on HEP including handout. Person educated: Patient Education method: Explanation, Demonstration, Tactile cues, Verbal cues, and Handouts Education comprehension: verbalized understanding, returned demonstration, and verbal cues required  HOME EXERCISE PROGRAM: Access Code: [email or web address] URL: [email or web address] Date: 01/19/2024  Prepared by: Camie Cleverly  Exercises - Supine Posterior Pelvic Tilt  - 2 x daily - 3 sets - 10 reps - Supine Lower Trunk Rotation  - 2 x daily - 3 sets - 10 reps - Supine Bridge  - 2 x daily - 3 sets - 10 reps - Seated Piriformis Stretch  - 2 x daily - 2 sets - 30 hold - Hip Flexor Stretch with Chair  - 2 x daily - 2 sets - 15 hold - Standard Plank  - 1 x daily - 2 sets - 30 second hold - Sidelying Open Book  - 1-2 x daily - 2 sets - 10 reps - 1 breath hold  ASSESSMENT:  CLINICAL IMPRESSION:  ***   Mechanical sensitivities: extension? Not much with load, not much with nerve tension  OBJECTIVE IMPAIRMENTS: decreased ROM, hypomobility, impaired flexibility, pain, and motor control, knowledge of condition, neural tension.   ACTIVITY LIMITATIONS: bending, sitting, standing, and sleeping  PARTICIPATION LIMITATIONS: patient states there a day where he can do anything without pain and others when he can't do anything.  PERSONAL FACTORS: Past/current experiences, Time since onset of injury/illness/exacerbation, 3+ comorbidities: coronary artery calcification, stable angina, GERD and hx of spinal tumor, and level of engagement in PT process are also affecting patient's functional outcome.   REHAB POTENTIAL: Good  CLINICAL DECISION MAKING: Stable/uncomplicated  EVALUATION COMPLEXITY: Low  GOALS: Goals reviewed with patient? No  SHORT TERM GOALS: Target  date: 12/29/2023  Patient will be independent with initial home exercise program for self-management of symptoms. Baseline: Initial HEP provided at IE (12/15/23); Goal status:  in progress  LONG TERM GOALS: Target date: 03/08/2024  Patient will be independent with a long-term home exercise program for self-management of symptoms.  Baseline: Initial HEP provided at IE (12/15/23); Goal status:  in progress  2.  Patient will improve active lumbar ext and bilateral side bending to >75% to improve tension and movement of the spine to reduce aggravating his symptoms. Baseline: Ext/R Side bending/L Side bending: 25% (12/15/23); Goal status:  in progress  3.  Patient will articulate what makes his pain better or worse to demonstrate improve awareness of his condition behavior to improve ability to participate in functional activities with less irritation. Baseline: unable to articulate clearly at this time (12/15/23); Goal status:  in progress  4.  Patient will demonstrate improvement in Patient Specific Functional Scale (PSFS) of equal or greater than 8/10 points to reflect clinically significant improvement in patient's most valued functional activities. Baseline: to be measured at visit 2 as appropriate (12/15/23); 5/10 (12/17/23) Goal status:  in progress  5.  Patient will report NPRS equal or less than 3/10 during functional activities during the last 2 weeks to improve their abilitly to complete community, work and/or recreational activities with less limitation. Baseline: 10/10 (12/15/23); Goal status: in progress  PLAN:  PT FREQUENCY: 2x/week  PT DURATION: 12 weeks  PLANNED INTERVENTIONS: 97164- PT Re-evaluation, 97750- Physical Performance Testing, 97110-Therapeutic exercises, 97530- Therapeutic activity, [id number]- Neuromuscular re-education, 97535- Self Care, [id number]- Manual therapy, [id number]- Electrical stimulation (unattended), 20560 (1-2 muscles), 20561 (3+ muscles)- Dry Needling,  Patient/Family education, Balance training, Spinal mobilization, Cryotherapy, and Moist heat.  PLAN FOR NEXT SESSION:    ***  Revisit thoracic mobilization and provide LAX peanut for home use. Continue anti extension training, consider multifidi assessment/training. Return to prone lumbar mobilizations as needed for symptomatic relief. Consider quadruped bird dogs (if tolerated). Update HEP as appropriate. Manual therapy and dry needling as  needed.   Fonda Simpers, PT, DPT Physical Therapist - [facility]  02/01/2024, 9:51 AM   "

## 2024-02-04 ENCOUNTER — Other Ambulatory Visit (INDEPENDENT_AMBULATORY_CARE_PROVIDER_SITE_OTHER)

## 2024-02-04 DIAGNOSIS — E039 Hypothyroidism, unspecified: Secondary | ICD-10-CM

## 2024-02-04 LAB — TSH: TSH: 0.97 u[IU]/mL (ref 0.35–5.50)

## 2024-02-12 ENCOUNTER — Other Ambulatory Visit: Payer: Self-pay | Admitting: Family

## 2024-02-15 ENCOUNTER — Ambulatory Visit: Admitting: Physical Therapy

## 2024-02-23 ENCOUNTER — Ambulatory Visit

## 2024-02-23 VITALS — Ht 67.0 in | Wt 175.0 lb

## 2024-02-23 DIAGNOSIS — Z0000 Encounter for general adult medical examination without abnormal findings: Secondary | ICD-10-CM | POA: Diagnosis not present

## 2024-02-23 NOTE — Patient Instructions (Signed)
 Mr. Willie Griffin,  Thank you for taking the time for your Medicare Wellness Visit. I appreciate your continued commitment to your health goals. Please review the care plan we discussed, and feel free to reach out if I can assist you further.  Please note that Annual Wellness Visits do not include a physical exam. Some assessments may be limited, especially if the visit was conducted virtually. If needed, we may recommend an in-person follow-up with your provider.  Ongoing Care Seeing your primary care provider every 3 to 6 months helps us  monitor your health and provide consistent, personalized care.   Referrals If a referral was made during today's visit and you haven't received any updates within two weeks, please contact the referred provider directly to check on the status.  Recommended Screenings:  Health Maintenance  Topic Date Due   Medicare Annual Wellness Visit  12/09/2023   Flu Shot  04/26/2024*   DTaP/Tdap/Td vaccine (4 - Td or Tdap) 10/21/2027   Colon Cancer Screening  02/07/2032   Pneumococcal Vaccine for age over 31  Completed   Hepatitis C Screening  Completed   Zoster (Shingles) Vaccine  Completed   Meningitis B Vaccine  Aged Out   COVID-19 Vaccine  Discontinued  *Topic was postponed. The date shown is not the original due date.       02/23/2024    1:07 PM  Advanced Directives  Does Patient Have a Medical Advance Directive? No  Would patient like information on creating a medical advance directive? Yes (MAU/Ambulatory/Procedural Areas - Information given)    Vision: Annual vision screenings are recommended for early detection of glaucoma, cataracts, and diabetic retinopathy. These exams can also reveal signs of chronic conditions such as diabetes and high blood pressure.  Dental: Annual dental screenings help detect early signs of oral cancer, gum disease, and other conditions linked to overall health, including heart disease and diabetes.  Please see the attached  documents for additional preventive care recommendations.

## 2024-02-23 NOTE — Progress Notes (Signed)
 "  Chief Complaint  Patient presents with   Medicare Wellness     Subjective:   Willie Griffin is a 69 y.o. male who presents for a Medicare Annual Wellness Visit.  Visit info / Clinical Intake: Medicare Wellness Visit Type:: Subsequent Annual Wellness Visit Persons participating in visit and providing information:: patient Medicare Wellness Visit Mode:: Telephone If telephone:: video declined Since this visit was completed virtually, some vitals may be partially provided or unavailable. Missing vitals are due to the limitations of the virtual format.: Unable to obtain vitals - no equipment If Telephone or Video please confirm:: I connected with patient using audio/video enable telemedicine. I verified patient identity with two identifiers, discussed telehealth limitations, and patient agreed to proceed. Patient Location:: home Provider Location:: home office Interpreter Needed?: No Pre-visit prep was completed: yes AWV questionnaire completed by patient prior to visit?: no Living arrangements:: (!) lives alone Patient's Overall Health Status Rating: excellent Typical amount of pain: some Does pain affect daily life?: no Are you currently prescribed opioids?: no  Dietary Habits and Nutritional Risks How many meals a day?: 2 Eats fruit and vegetables daily?: (!) no (never has eaten) Most meals are obtained by: eating out In the last 2 weeks, have you had any of the following?: none Diabetic:: no  Functional Status Activities of Daily Living (to include ambulation/medication): Independent Ambulation: Independent Medication Administration: Independent Home Management (perform basic housework or laundry): Independent Manage your own finances?: yes Primary transportation is: driving Concerns about vision?: (!) yes (vision not good in lft eye) Concerns about hearing?: no  Fall Screening Falls in the past year?: 0 Number of falls in past year: 1 Was there an injury with  Fall?: 0 Fall Risk Category Calculator: 1 Patient Fall Risk Level: Low Fall Risk  Fall Risk Patient at Risk for Falls Due to: Orthopedic patient Fall risk Follow up: Falls evaluation completed; Education provided; Falls prevention discussed  Home and Transportation Safety: All rugs have non-skid backing?: yes (one rug at bed o backing) All stairs or steps have railings?: (!) no (3 steps at front door) Grab bars in the bathtub or shower?: yes Have non-skid surface in bathtub or shower?: yes Good home lighting?: yes Regular seat belt use?: yes Hospital stays in the last year:: no  Cognitive Assessment Difficulty concentrating, remembering, or making decisions? : no Will 6CIT or Mini Cog be Completed: yes What year is it?: 0 points What month is it?: 0 points Give patient an address phrase to remember (5 components): [address] [place]  About what time is it?: 0 points Count backwards from 20 to 1: 0 points Say the months of the year in reverse: 0 points Repeat the address phrase from earlier: 0 points 6 CIT Score: 0 points  Advance Directives (For Healthcare) Does Patient Have a Medical Advance Directive?: No Would patient like information on creating a medical advance directive?: Yes (MAU/Ambulatory/Procedural Areas - Information given)  Reviewed/Updated  Reviewed/Updated: Reviewed All (Medical, Surgical, Family, Medications, Allergies, Care Teams, Patient Goals)    Allergies (verified) Bee venom and Ciprofloxacin   Current Medications (verified) Outpatient Encounter Medications as of 02/23/2024  Medication Sig   aspirin  EC 81 MG tablet Take 1 tablet (81 mg total) by mouth daily. Swallow whole.   atorvastatin  (LIPITOR) 10 MG tablet Take 1 tablet (10 mg total) by mouth daily.   Cyanocobalamin  (VITAMIN B-12 PO) Take 2 Doses by mouth daily.   EPIPEN  2-PAK 0.3 MG/0.3ML SOAJ injection INJECT 0.3 MG  INTRAMUSCULARLY ONCE AS NEEDED FOR UP TO 1 DOSE   hydrocortisone   2.5 % cream Use rectally twice daily as neeed   levothyroxine  (SYNTHROID ) 25 MCG tablet TAKE 1 TABLET BY MOUTH EVERY DAY BEFORE BREAKFAST   omeprazole  (PRILOSEC) 20 MG capsule Take 1 capsule (20 mg total) by mouth daily. After 8 weeks use only as needed   OVER THE COUNTER MEDICATION Take 2 capsules by mouth daily. Synoviox includes MSM, turmeric, and hyaluronic acid   SOOLANTRA 1 % CREA Apply topically daily. to face   tadalafil  (CIALIS ) 20 MG tablet Take 0.5-1 tablets (10-20 mg total) by mouth every other day as needed for erectile dysfunction.   [DISCONTINUED] levothyroxine  (SYNTHROID ) 25 MCG tablet TAKE 1 TABLET BY MOUTH EVERY DAY BEFORE BREAKFAST   No facility-administered encounter medications on file as of 02/23/2024.    History: Past Medical History:  Diagnosis Date   Allergy    allergic rhinitis   GERD (gastroesophageal reflux disease)    Hemorrhoid    Hyperlipidemia    Hypothyroidism    Peyronie disease    Past Surgical History:  Procedure Laterality Date   BACK SURGERY     COLONOSCOPY     CORONARY STENT INTERVENTION N/A 08/21/2022   Procedure: CORONARY STENT INTERVENTION;  Surgeon: Darron Deatrice LABOR, MD;  Location: [facility];  Service: Cardiovascular;  Laterality: N/A;   HERNIA REPAIR     INGUINAL HERNIA REPAIR  09/2003   LEFT HEART CATH AND CORONARY ANGIOGRAPHY Left 08/21/2022   Procedure: LEFT HEART CATH AND CORONARY ANGIOGRAPHY;  Surgeon: Darron Deatrice LABOR, MD;  Location: [facility];  Service: Cardiovascular;  Laterality: Left;   MOHS SURGERY     nasal SCC----[facility]   SPINE SURGERY     TOENAIL EXCISION     partial   TONSILLECTOMY  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     Family History  Problem Relation Age of Onset   Arthritis Mother    Heart attack Mother    Heart attack Father    Dementia Father        Lewy body dementia   Heart disease Father        stent x 11    Hodgkin's lymphoma Sister    Cancer Sister    Other Sister         precancerous polyps   Cancer Paternal Aunt        jaw ca   Celiac disease Paternal Uncle    Other Paternal Uncle        mesothelioma   Stroke Maternal Grandmother    Coronary artery disease Maternal Grandfather    Suicidality Paternal Grandmother    Breast cancer Paternal Grandmother    Coronary artery disease Paternal Grandfather    Breast cancer Other    Diabetes Neg Hx    Hypertension Neg Hx    Colon polyps Neg Hx    Colon cancer Neg Hx    Rectal cancer Neg Hx    Stomach cancer Neg Hx    Esophageal cancer Neg Hx    Social History   Occupational History   Occupation: Merchandiser, 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   Occupation: Driving school bus    Comment: and other part time work  Tobacco Use   Smoking status: Never    Passive exposure: Past   Smokeless tobacco: Never  Vaping Use   Vaping status: Never Used  Substance and Sexual Activity  Alcohol use: Never   Drug use: No   Sexual activity: Yes   Tobacco Counseling Counseling given: Not Answered  SDOH Screenings   Food Insecurity: No Food Insecurity (02/23/2024)  Housing: Low Risk (02/23/2024)  Transportation Needs: No Transportation Needs (02/23/2024)  Utilities: Not At Risk (02/23/2024)  Depression (PHQ2-9): Low Risk (02/23/2024)  Financial Resource Strain: Low Risk (12/08/2023)  Physical Activity: Inactive (02/23/2024)  Social Connections: Moderately Integrated (02/23/2024)  Stress: No Stress Concern Present (02/23/2024)  Tobacco Use: Low Risk (02/23/2024)  Health Literacy: Adequate Health Literacy (02/23/2024)   See flowsheets for full screening details  Depression Screen PHQ 2 & 9 Depression Scale- Over the past 2 weeks, how often have you been bothered by any of the following problems? Little interest or pleasure in doing things: 0 Feeling down, depressed, or hopeless (PHQ Adolescent also includes...irritable): 0 PHQ-2 Total Score: 0 Trouble falling or staying asleep, or sleeping  too much: 0 Feeling tired or having little energy: 0 Poor appetite or overeating (PHQ Adolescent also includes...weight loss): 0 Feeling bad about yourself - or that you are a failure or have let yourself or your family down: 0 Trouble concentrating on things, such as reading the newspaper or watching television (PHQ Adolescent also includes...like school work): 0 Moving or speaking so slowly that other people could have noticed. Or the opposite - being so fidgety or restless that you have been moving around a lot more than usual: 0 Thoughts that you would be better off dead, or of hurting yourself in some way: 0 PHQ-9 Total Score: 0 If you checked off any problems, how difficult have these problems made it for you to do your work, take care of things at home, or get along with other people?: Not difficult at all     Goals Addressed             This Visit's Progress    Decrease my sugar intake to eliminate  inflammation               Objective:    Today's Vitals   02/23/24 1322  Weight: 175 lb (79.4 kg)  Height: 5' 7 (1.702 m)   Body mass index is 27.41 kg/m.  Hearing/Vision screen No results found. Immunizations and Health Maintenance Health Maintenance  Topic Date Due   Medicare Annual Wellness (AWV)  12/09/2023   Influenza Vaccine  04/26/2024 (Originally 08/28/2023)   DTaP/Tdap/Td (4 - Td or Tdap) 10/21/2027   Colonoscopy  02/07/2032   Pneumococcal Vaccine: 50+ Years  Completed   Hepatitis C Screening  Completed   Zoster Vaccines- Shingrix   Completed   Meningococcal B Vaccine  Aged Out   COVID-19 Vaccine  Discontinued        Assessment/Plan:  This is a routine wellness examination for [place].  Patient Care Team: Bennett Reuben POUR, MD as PCP - General (Family Medicine) Darliss Rogue, MD as PCP - Cardiology (Cardiology) Mevelyn Charleston, OD as Referring Physician (Optometry)  I have personally reviewed and noted the following in the patients chart:    Medical and social history Use of alcohol, tobacco or illicit drugs  Current medications and supplements including opioid prescriptions. Functional ability and status Nutritional status Physical activity Advanced directives List of other physicians Hospitalizations, surgeries, and ER visits in previous 12 months Vitals Screenings to include cognitive, depression, and falls Referrals and appointments  No orders of the defined types were placed in this encounter.  In addition, I have reviewed and discussed with patient certain  preventive protocols, quality metrics, and best practice recommendations. A written personalized care plan for preventive services as well as general preventive health recommendations were provided to patient.   Willie LITTIE Saris, LPN   [id number]/72/[id number]   No follow-ups on file.  After Visit Summary: (MyChart) Due to this being a telephonic visit, the after visit summary with patients personalized plan was offered to patient via MyChart   Nurse Notes: No voiced or noted concerns at this time Patient advised to keep follow-up appointment with PCP (Dec 2026) Vaccines not given: Influenza (pt says he does not desire  declined today   "

## 2024-02-26 ENCOUNTER — Ambulatory Visit

## 2025-01-09 ENCOUNTER — Other Ambulatory Visit

## 2025-01-16 ENCOUNTER — Encounter
